# Patient Record
Sex: Male | Born: 1960 | Race: Black or African American | Hispanic: No | Marital: Married | Smoking: Never smoker
Health system: Southern US, Community
[De-identification: ages and names within clinical notes are randomized; demographics above are authoritative.]

## PROBLEM LIST (undated history)

## (undated) DIAGNOSIS — B181 Chronic viral hepatitis B without delta-agent: Secondary | ICD-10-CM

## (undated) DIAGNOSIS — E119 Type 2 diabetes mellitus without complications: Secondary | ICD-10-CM

## (undated) DIAGNOSIS — E7800 Pure hypercholesterolemia, unspecified: Secondary | ICD-10-CM

## (undated) DIAGNOSIS — E785 Hyperlipidemia, unspecified: Secondary | ICD-10-CM

## (undated) DIAGNOSIS — I10 Essential (primary) hypertension: Secondary | ICD-10-CM

## (undated) DIAGNOSIS — R7303 Prediabetes: Secondary | ICD-10-CM

## (undated) DIAGNOSIS — C801 Malignant (primary) neoplasm, unspecified: Secondary | ICD-10-CM

## (undated) HISTORY — DX: Pure hypercholesterolemia, unspecified: E78.00

## (undated) HISTORY — DX: Chronic viral hepatitis B without delta-agent: B18.1

## (undated) HISTORY — DX: Essential (primary) hypertension: I10

## (undated) HISTORY — DX: Hyperlipidemia, unspecified: E78.5

## (undated) HISTORY — DX: Prediabetes: R73.03

---

## 1998-10-09 ENCOUNTER — Ambulatory Visit (HOSPITAL_COMMUNITY): Admission: RE | Admit: 1998-10-09 | Discharge: 1998-10-09 | Payer: Self-pay

## 2004-02-15 DIAGNOSIS — B181 Chronic viral hepatitis B without delta-agent: Secondary | ICD-10-CM | POA: Insufficient documentation

## 2004-02-15 HISTORY — DX: Chronic viral hepatitis B without delta-agent: B18.1

## 2004-02-27 ENCOUNTER — Encounter: Admission: RE | Admit: 2004-02-27 | Discharge: 2004-02-27 | Payer: Self-pay | Admitting: Specialist

## 2004-07-27 ENCOUNTER — Ambulatory Visit (HOSPITAL_COMMUNITY): Admission: RE | Admit: 2004-07-27 | Discharge: 2004-07-27 | Payer: Self-pay | Admitting: Gastroenterology

## 2004-10-29 ENCOUNTER — Ambulatory Visit: Payer: Self-pay | Admitting: Gastroenterology

## 2004-12-09 DIAGNOSIS — E7800 Pure hypercholesterolemia, unspecified: Secondary | ICD-10-CM

## 2004-12-09 HISTORY — DX: Pure hypercholesterolemia, unspecified: E78.00

## 2006-01-05 ENCOUNTER — Emergency Department (HOSPITAL_COMMUNITY): Admission: EM | Admit: 2006-01-05 | Discharge: 2006-01-05 | Payer: Self-pay | Admitting: *Deleted

## 2010-11-20 ENCOUNTER — Ambulatory Visit: Payer: Self-pay | Admitting: Internal Medicine

## 2010-11-20 DIAGNOSIS — I10 Essential (primary) hypertension: Secondary | ICD-10-CM

## 2010-11-20 DIAGNOSIS — E785 Hyperlipidemia, unspecified: Secondary | ICD-10-CM

## 2010-11-28 ENCOUNTER — Encounter (INDEPENDENT_AMBULATORY_CARE_PROVIDER_SITE_OTHER): Payer: Self-pay | Admitting: Internal Medicine

## 2010-11-30 ENCOUNTER — Encounter (INDEPENDENT_AMBULATORY_CARE_PROVIDER_SITE_OTHER): Payer: Self-pay | Admitting: Internal Medicine

## 2010-12-21 ENCOUNTER — Ambulatory Visit
Admission: RE | Admit: 2010-12-21 | Discharge: 2010-12-21 | Payer: Self-pay | Source: Home / Self Care | Attending: Internal Medicine | Admitting: Internal Medicine

## 2010-12-21 ENCOUNTER — Encounter (INDEPENDENT_AMBULATORY_CARE_PROVIDER_SITE_OTHER): Payer: Self-pay | Admitting: Internal Medicine

## 2010-12-21 LAB — CONVERTED CEMR LAB
AST: 21 units/L (ref 0–37)
Blood in Urine, dipstick: NEGATIVE
CO2: 29 meq/L (ref 19–32)
Chloride: 102 meq/L (ref 96–112)
Creatinine, Ser: 0.88 mg/dL (ref 0.40–1.50)
Eosinophils Absolute: 0.6 10*3/uL (ref 0.0–0.7)
Eosinophils Relative: 12 % — ABNORMAL HIGH (ref 0–5)
Glucose, Urine, Semiquant: NEGATIVE
HCT: 44.4 % (ref 39.0–52.0)
HDL: 43 mg/dL (ref >39)
LDL Cholesterol: 103 mg/dL — ABNORMAL HIGH (ref 0–99)
Lymphocytes Relative: 47 % — ABNORMAL HIGH (ref 12–46)
Monocytes Absolute: 0.4 10*3/uL (ref 0.1–1.0)
Neutro Abs: 1.5 10*3/uL — ABNORMAL LOW (ref 1.7–7.7)
Neutrophils Relative %: 32 % — ABNORMAL LOW (ref 43–77)
Potassium: 3.2 meq/L — ABNORMAL LOW (ref 3.5–5.3)
RBC: 5.31 M/uL (ref 4.22–5.81)
Sodium: 142 meq/L (ref 135–145)
Specific Gravity, Urine: >=1.03
Triglycerides: 75 mg/dL (ref <150)
VLDL: 15 mg/dL (ref 0–40)
WBC Urine, dipstick: NEGATIVE
WBC: 4.8 10*3/uL (ref 4.0–10.5)
pH: 6

## 2010-12-25 ENCOUNTER — Encounter (INDEPENDENT_AMBULATORY_CARE_PROVIDER_SITE_OTHER): Payer: Self-pay | Admitting: Internal Medicine

## 2011-01-07 ENCOUNTER — Telehealth (INDEPENDENT_AMBULATORY_CARE_PROVIDER_SITE_OTHER): Payer: Self-pay | Admitting: Internal Medicine

## 2011-01-08 ENCOUNTER — Encounter (INDEPENDENT_AMBULATORY_CARE_PROVIDER_SITE_OTHER): Payer: Self-pay | Admitting: Internal Medicine

## 2011-01-10 NOTE — Assessment & Plan Note (Signed)
Summary: BP recheck, fasting labs  Nurse Visit   Vital Signs:  Patient profile:   50 year old male Weight:      182.9 pounds Temp:     97.1 degrees F oral Pulse rate:   56 / minute Pulse rhythm:   regular Resp:     16 per minute BP sitting:   128 / 94  (left arm) Cuff size:   regular  Vitals Entered By: Dutch Quint RN (December 21, 2010 9:31 AM)  Patient Instructions: 1)  Reviewed with Dr. Delrae Alfred 2)  Your blood pressure is still elevated, but better 3)  Continue to take your medications -- there's no change at this time. 4)  We will let you know the results of your labwork. 5)  Return in one month for a blood pressure recheck.  Make sure you drink plenty of water before your visit, we will recheck your urine at that time. 6)  Call if anything changes or if you have any questions.    Impression & Recommendations:  Problem # 1:  ESSENTIAL HYPERTENSION (ICD-401.9) BP better, still elevated No change in meds Labs done Return in one month for BP recheck with triage nurse and repeat UA (protein in urine)  His updated medication list for this problem includes:    Lisinopril-hydrochlorothiazide 20-25 Mg Tabs (Lisinopril-hydrochlorothiazide) .Marland Kitchen... 1 tab by mouth in morning daily  Orders: T-Comprehensive Metabolic Panel (21308-65784) T-CBC w/Diff (69629-52841) UA Dipstick w/o Micro (automated)  (81003) Est. Patient Level I (32440)  Complete Medication List: 1)  Lisinopril-hydrochlorothiazide 20-25 Mg Tabs (Lisinopril-hydrochlorothiazide) .Marland Kitchen.. 1 tab by mouth in morning daily 2)  Pravastatin Sodium 40 Mg Tabs (Pravastatin sodium) .Marland Kitchen.. 1 tab by mouth daily with evening meal  Other Orders: T-Lipid Profile ([id number])  CC: BP recheck and fasting labs Is Patient Diabetic? No Pain Assessment Patient in pain? no       Does patient need assistance? Functional Status Self care Ambulation Normal   CC:  BP recheck and fasting labs.  History of Present  Illness: 11/20/10- was new pt. BP 146/98.  Had been off BP meds for over a month, started on lisinopril-HCTZ.  Here for BP recheck and fasting labs.  States has been taking medication every day since last visit,  asymptomatic.  Has been NPO since last night.   Physical Exam  General:  alert, well-developed, well-nourished, and well-hydrated.     Review of Systems CV:  Denies CP, cough, headache, visual changes, peripheral edema..   Allergies: No Known Drug Allergies Laboratory Results   Urine Tests  Date/Time Received: December 21, 2010 9:48 AM   Routine Urinalysis   Color: dk. yellow Glucose: negative   (Normal Range: Negative) Bilirubin: negative   (Normal Range: Negative) Ketone: negative   (Normal Range: Negative) Spec. Gravity: >=1.030   (Normal Range: 1.003-1.035) Blood: negative   (Normal Range: Negative) pH: 6.0   (Normal Range: 5.0-8.0) Protein: 30   (Normal Range: Negative) Urobilinogen: 0.2   (Normal Range: 0-1) Nitrite: negative   (Normal Range: Negative) Leukocyte Esterace: negative   (Normal Range: Negative)       Orders Added: 1)  T-Comprehensive Metabolic Panel [[id number]] 2)  T-CBC w/Diff [[id number]] 3)  T-Lipid Profile [[id number]] 4)  UA Dipstick w/o Micro (automated)  [81003] 5)  Est. Patient Level I [63875]

## 2011-01-10 NOTE — Letter (Signed)
Summary: [facility]  [facility]   Imported By: Arta Bruce 12/04/2010 14:23:19  _____________________________________________________________________  External Attachment:    Type:   Image     Comment:   External Document

## 2011-01-10 NOTE — Letter (Signed)
Summary: REQUESTING RECORDS FROM [facility]  REQUESTING RECORDS FROM DR.[facility]   Imported By: Arta Bruce 12/04/2010 13:59:53  _____________________________________________________________________  External Attachment:    Type:   Image     Comment:   External Document

## 2011-01-10 NOTE — Letter (Signed)
Summary: REQUESING RECORDS FROM [facility]  REQUESING RECORDS FROM [facility]   Imported By: Arta Bruce 12/26/2010 11:32:41  _____________________________________________________________________  External Attachment:    Type:   Image     Comment:   External Document

## 2011-01-10 NOTE — Assessment & Plan Note (Signed)
Summary: NEW///[place]   Vital Signs:  Patient profile:   50 year old male Height:      67 inches Weight:      179.19 pounds BMI:     28.17 Temp:     98.2 degrees F oral Pulse rate:   72 / minute Pulse rhythm:   regular Resp:     16 per minute BP sitting:   146 / 98  (left arm) Cuff size:   regular  Vitals Entered By: Hale Drone CMA (November 20, 2010 9:24 AM) CC: Here to establish care. BP concerns. Was treated at [facility] the last 2 years.    CC:  Here to establish care. BP concerns. Was treated at [facility] the last 2 years. .  History of Present Illness: 50 yo male here to establish.  Concerns:  1.  Hypertension:  Diagnosed 8 years ago.  Lisinopril and HCTZ used previously.  Did control blood pressure.  Has not been on Lisinopril for about 3 months and HCTZ for 2 months.  Did not have any problems with a medication  2.  Hypercholesterolemia:  States was just a little high in past.  Has not eaten today.  Was taking Pravastatin 40 mg.  Was taking this up until 2 days ago.  Father and and paternal uncle died suddenly.  Father died at age 54.  Uncle was 78.  Pt. does have a history of weight problem, but has changed lifestyle to lose weight.  No hs of DM.    3.  Hx of Chest Pain:  about 1 year ago--was evaluated at [facility].  Pt. never received information regarding results.  Has not had any further chest pain.  Current Medications (verified): 1)  None  Allergies (verified): No Known Drug Allergies  Past History:  Past Medical History: CHEST PAIN (ICD-786.50) HYPERCHOLESTEROLEMIA (ICD-272.0) ESSENTIAL HYPERTENSION (ICD-401.9)  Past Surgical History: None  Family History: Mother, 28:  Healthy Father, died 60, ? MI--died suddenly 2 Brothers and 3 Sisters on maternal side:  1 Sister with htn 4 Brothers ans 2 Sisters on paternal side:  2 Brothers died from ? stomach ulcers--bled to death.  Sisters healthy 5 children:   ages 22, 51, 60, 68, 47:  all healthy  Social History: Originally from [place] Came to the [employer]. 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 Lives at home with wife and 5 children. Tobacco :  no Alchohol:  no Drugs:  no.  Physical Exam  General:  NAD Lungs:  Normal respiratory effort, chest expands symmetrically. Lungs are clear to auscultation, no crackles or wheezes. Heart:  Normal rate and regular rhythm. S1 and S2 normal without gallop, murmur, click, rub or other extra sounds.  Radial pulse normal and equal   Impression & Recommendations:  Problem # 1:  ESSENTIAL HYPERTENSION (ICD-401.9) Restart meds--sounds like he was actually on Triamterene/HCTZ before after further discussion, but will see how he does with this combo pill His updated medication list for this problem includes:    Lisinopril-hydrochlorothiazide 20-25 Mg Tabs (Lisinopril-hydrochlorothiazide) .Marland Kitchen... 1 tab by mouth in morning daily  Problem # 2:  HYPERCHOLESTEROLEMIA (ICD-272.0)  His updated medication list for this problem includes:    Pravastatin Sodium 40 Mg Tabs (Pravastatin sodium) .Marland Kitchen... 1 tab by mouth daily with evening meal  Complete Medication List: 1)  Lisinopril-hydrochlorothiazide 20-25 Mg Tabs (Lisinopril-hydrochlorothiazide) .Marland Kitchen.. 1 tab by mouth in morning daily 2)  Pravastatin Sodium 40  Mg Tabs (Pravastatin sodium) .Marland Kitchen.. 1 tab by mouth daily with evening meal  Patient Instructions: 1)  Release of info:  Dr. Duaine Dredge, [facility], [facility]--the latter for stress testing. 2)  Nurse visit in one month--fasting for labs as well.  Needs BP check as restarting bp meds and needs CMET, CBC, UA, FLP   3)  Follow up with Dr. Delrae Alfred in 4 months --CPE Prescriptions: PRAVASTATIN SODIUM 40 MG TABS (PRAVASTATIN SODIUM) 1 tab by mouth daily with evening meal  #30 x 11   Entered and Authorized by:   Julieanne Manson MD   Signed by:   Julieanne Manson MD on 11/20/2010   Method used:   Electronically to        [facility] [phone]* (retail)       [address]       [place], [place]  [address]       Ph: [phone]       Fax: [phone]   RxID:   [id number] LISINOPRIL-HYDROCHLOROTHIAZIDE 20-25 MG TABS (LISINOPRIL-HYDROCHLOROTHIAZIDE) 1 tab by mouth in morning daily  #30 x 11   Entered and Authorized by:   Julieanne Manson MD   Signed by:   Julieanne Manson MD on 11/20/2010   Method used:   Electronically to        [facility] [phone]* (retail)       [address]       [place], [place]  [address]       Ph: [phone]       Fax: [phone]   RxID:   [id number]    Orders Added: 1)  New Patient Level II [02409]   Immunization History:  Influenza Immunization History:    Influenza:  fluvax 3+ (09/08/2010)  Tetanus/Td Immunization History:    Tetanus/Td:  historical (06/08/2004)   Immunization History:  Influenza Immunization History:    Influenza:  Fluvax 3+ (09/08/2010)  Tetanus/Td Immunization History:    Tetanus/Td:  Historical (06/08/2004)

## 2011-01-16 NOTE — Progress Notes (Signed)
Summary: BACK PAIN  Phone Note Call from Patient Call back at Home Phone [phone]   Summary of Call: Ceci Taliaferro PT. MR Shadduck SAYS THAT HE HAS BACK PAIN AND WANTS TO BE SEEN TODAY OT TOMORROW AND I EXPLAINED TO HIM THAT THERE IS NOTHING AVAILABLE SOON, AND THAT I'LL TAKE THIS MESSAGE AND HAVE SOMEONE TO CALL HIM. Initial call taken by: Leodis Rains,  January 07, 2011 9:55 AM  Follow-up for Phone Call        Left message on answer machine for pt. to return call.  Gaylyn Cheers RN  January 07, 2011 1:59 PM  Left message on answering machine for pt. to return call.  Dutch Quint RN  January 08, 2011 3:01 PM   Additional Follow-up for Phone Call Additional follow up Details #1::        CALLED BACK PLEASE GIVE HIM A CALL BACK [phone] Additional Follow-up by: Arta Bruce,  January 08, 2011 4:35 PM    Additional Follow-up for Phone Call Additional follow up Details #2::    Need more info on back pain. Please also let him know his labs, including his cholesterol were okay, though if I find out from the cardiology notes that he does have heart disease, would like to see a bit better cholesterol.  He needs to eat an orange or a banana daily as his potassium was a bit low.  Would like to reschedule him for a repeat BMET in a month to recheck the potassium.  Julieanne Manson MD  January 09, 2011 5:45 AM   Back pain started Monday of last week, when he got out of the shower, denies injury.  Pain is in lower back, very sharp at first, then relieved, but got worse over the weekend.  Pain is 8 on pain scale right now.  Went to the chiropractor for adjustments this past Monday, yesterday and has appointment tomorrow, got some relief from pain, but it still having pain after sitting for 5-10 minutes, standing up, still in lower back and left side.  Is taking ibuprofen, has helped some.  Applied peppermint oil, has helped some.  Pain has been pretty constant, but has obtained some  relief from adjustments.  Would like to have something stronger that ibuprofen for pain.   Advised of lab results and provider's recommendations -- verbalized understanding and agreement.  F/U BMET scheduled 02/07/11.   Follow-up by: Dutch Quint RN,  January 09, 2011 11:28 AM  Additional Follow-up for Phone Call Additional follow up Details #3:: Details for Additional Follow-up Action Taken: He will need to be seen to get something stronger.   Encourage gentle stretching after warm packing area for about 15 minutes two times a day. If he develops lower extremity weakness, he will need to be seen sooner.  Julieanne Manson MD  January 10, 2011 1:38 PM   States went to chiropractor -- is somewhat better.  Advised per chiropractor to increase ibuprofen 200 mg. tablets to 3 every 8 hours.  I instructed him to make sure he takes those with food to prevent stomach upset.  Advised of provider's response and recommendations -- verbalized understanding and agreement.  Will call back if pain persists past the weekend.  Has appt. with triage for BP recheck on 01/18/11.  Dutch Quint RN  January 10, 2011 3:55 PM

## 2011-01-18 ENCOUNTER — Encounter: Payer: Self-pay | Admitting: Internal Medicine

## 2011-01-18 ENCOUNTER — Encounter (INDEPENDENT_AMBULATORY_CARE_PROVIDER_SITE_OTHER): Payer: Self-pay | Admitting: Internal Medicine

## 2011-01-18 LAB — CONVERTED CEMR LAB
Bilirubin Urine: NEGATIVE
Blood in Urine, dipstick: NEGATIVE
Nitrite: NEGATIVE
Protein, U semiquant: NEGATIVE
Urobilinogen, UA: 0.2

## 2011-01-24 NOTE — Assessment & Plan Note (Signed)
Summary: BP recheck  Nurse Visit   Vital Signs:  Patient profile:   50 year old male Weight:      180.1 pounds Temp:     98.1 degrees F oral Pulse rate:   60 / minute Pulse rhythm:   regular Resp:     16 per minute BP sitting:   144 / 98  (right arm) Cuff size:   regular  Vitals Entered By: Dutch Quint RN (January 18, 2011 10:13 AM) CC: BP recheck and repeat UA Pain Assessment Patient in pain? yes     Location: lower back, left buttocks Intensity: 6 Type: sharp Onset of pain  two weeks ago, when stepping out of shower  Does patient need assistance? Functional Status Self care Ambulation Normal   CC:  BP recheck and repeat UA.  History of Present Illness: 12/21/10  BP 128/94  P56.  No meds changed at that time.  Had protein in urine, was to drink plenty of water and to have repeat UA today.  Had BP checked at work yesterday, 160/96.  Taking medications every day, no missed doses.  Was taking two meds for BP in the past, is now taking one -- thinks that's why BP is elevated.   Patient Instructions: 1)  Reviewed with Dr. Delrae Alfred 2)  Your blood pressure is still elevated. 3)  Take lisinopril 20 mg. by mouth one tab daily IN ADDITION to the current lisinopril/HCTZ. 4)  Schedule appointment to follow-up with Dr. Delrae Alfred for blood pressure and back pain 5)  If follow up is longer than 1 month-- return for bp check with BMET in 1 month--nurse visit. 6)  Call if anything changes or if you have any questions.   Impression & Recommendations:  Problem # 1:  ESSENTIAL HYPERTENSION (ICD-401.9)  BP is still elevated, taking ibuprofen regularly for back pain Add lisinopril 20 to current meds Return for f/u with provider for back pain, BP If more than a month for f/u, return 1 month for BP recheck and BMET with triage nurse  His updated medication list for this problem includes:    Lisinopril-hydrochlorothiazide 20-25 Mg Tabs (Lisinopril-hydrochlorothiazide) .Marland Kitchen... 1 tab by  mouth in morning daily    Lisinopril 20 Mg Tabs (Lisinopril) .Marland Kitchen... 1 tab by mouth daily with lisinopril/hctz  Orders: UA Dipstick w/o Micro (automated)  (81003)  Complete Medication List: 1)  Lisinopril-hydrochlorothiazide 20-25 Mg Tabs (Lisinopril-hydrochlorothiazide) .Marland Kitchen.. 1 tab by mouth in morning daily 2)  Pravastatin Sodium 40 Mg Tabs (Pravastatin sodium) .Marland Kitchen.. 1 tab by mouth daily with evening meal 3)  Lisinopril 20 Mg Tabs (Lisinopril) .Marland Kitchen.. 1 tab by mouth daily with lisinopril/hctz   Review of Systems CV:  Denies CP, SOB, dizziness, headache, cough, visual changes, peripheral edema.   Physical Exam  General:  alert, well-developed, well-nourished, and well-hydrated.     Allergies: No Known Drug Allergies Laboratory Results   Urine Tests  Date/Time Received: January 18, 2011 11:00 AM   Routine Urinalysis   Glucose: negative   (Normal Range: Negative) Bilirubin: negative   (Normal Range: Negative) Ketone: negative   (Normal Range: Negative) Spec. Gravity: <1.005   (Normal Range: 1.003-1.035) Blood: negative   (Normal Range: Negative) pH: 6.5   (Normal Range: 5.0-8.0) Protein: negative   (Normal Range: Negative) Urobilinogen: 0.2   (Normal Range: 0-1) Nitrite: negative   (Normal Range: Negative) Leukocyte Esterace: negative   (Normal Range: Negative)       Orders Added: 1)  Est. Patient Level II [04540] 2)  UA Dipstick w/o Micro (automated)  [81003] Prescriptions: LISINOPRIL 20 MG TABS (LISINOPRIL) 1 tab by mouth daily with Lisinopril/HCTZ  #30 x 11   Entered by:   Dutch Quint RN   Authorized by:   Julieanne Manson MD   Signed by:   Dutch Quint RN on 01/18/2011   Method used:   Electronically to        [facility] [phone]* (retail)       [address]       [place], [place]  [address]       Ph: [phone]       Fax: [phone]   RxID:   [id number]

## 2011-02-04 ENCOUNTER — Encounter (INDEPENDENT_AMBULATORY_CARE_PROVIDER_SITE_OTHER): Payer: Self-pay | Admitting: Internal Medicine

## 2011-02-06 ENCOUNTER — Telehealth (INDEPENDENT_AMBULATORY_CARE_PROVIDER_SITE_OTHER): Payer: Self-pay | Admitting: Internal Medicine

## 2011-02-06 ENCOUNTER — Encounter (INDEPENDENT_AMBULATORY_CARE_PROVIDER_SITE_OTHER): Payer: Self-pay | Admitting: Internal Medicine

## 2011-02-12 ENCOUNTER — Encounter: Payer: Self-pay | Admitting: Internal Medicine

## 2011-02-12 ENCOUNTER — Encounter (INDEPENDENT_AMBULATORY_CARE_PROVIDER_SITE_OTHER): Payer: Self-pay | Admitting: Internal Medicine

## 2011-02-14 NOTE — Miscellaneous (Signed)
Summary: Dr. Duaine Dredge, [facility] recoreds review  Clinical Lists Changes  Problems: Added new problem of HEPATITIS B, CHRONIC (ICD-070.32) - 02/15/2004:  +Core Ab, +SAg, -SAb, -Be Ag with elevated liver enzymes HBV DNA via PCR:  <2.3 AFP normal [facility]/24/[facility]--Dr. Jacqualine Mau Added new problem of ELEVATED LIVER ENZYMES (ICD-790.5)

## 2011-02-14 NOTE — Letter (Signed)
Summary: RECEIVED RECORDS FROM  [facility]  RECEIVED RECORDS FROM  [facility]   Imported By: Arta Bruce 02/08/2011 10:25:29  _____________________________________________________________________  External Attachment:    Type:   Image     Comment:   External Document

## 2011-02-14 NOTE — Letter (Signed)
Summary: [facility]  [facility]   Imported By: Arta Bruce 02/05/2011 16:24:59  _____________________________________________________________________  External Attachment:    Type:   Image     Comment:   External Document

## 2011-02-14 NOTE — Miscellaneous (Signed)
Summary: [facility] record review  Clinical Lists Changes  Problems: Changed problem from CHEST PAIN (ICD-786.50) - states had stress testing 1 year ago--never heard back.  Sounds like he went to [facility] as well as [facility]. to CHEST PAIN-NONCARDIAC (ICD-786.50) - Normal stress nuclear testing on 11/16/2009 with Dr. Allyson Sabal at [facility] Observations: Added new observation of MIBI STRESS: Dr. Allyson Sabal:  ECG and Nuclear stress testing normal.  EF 63% (11/16/2009 8:49)

## 2011-02-19 NOTE — Letter (Signed)
Summary: REQUESTING RECORDS FROM [facility]  REQUESTING RECORDS FROM [facility]   Imported By: Arta Bruce 02/12/2011 16:52:03  _____________________________________________________________________  External Attachment:    Type:   Image     Comment:   External Document

## 2011-02-19 NOTE — Progress Notes (Signed)
Summary: Need records release for [facility]  Phone Note Outgoing Call   Summary of Call: Please call and ask pt. to sign a records release for [facility]--Dr. Jacqualine Mau.  I have his OV from 07/2004, but want to know if any other follow up done and final assessment regarding Hep B Initial call taken by: Julieanne Manson MD,  February 06, 2011 2:18 PM  Follow-up for Phone Call        Dr. Delrae Alfred -- Pt. has appt. w/you on 02/12/11 @ 12pm. -- Will have him sign then. -- Hale Drone CMA  February 11, 2011 4:32 PM   Additional Follow-up for Phone Call Additional follow up Details #1::        Pt. signed @ visit today. Gaylyn Cheers RN  February 12, 2011 1:09 PM

## 2011-02-19 NOTE — Letter (Signed)
Summary: RECORDS RECEIVED FROM [facility]  RECORDS RECEIVED FROM [facility]   Imported By: Arta Bruce 02/11/2011 09:31:45  _____________________________________________________________________  External Attachment:    Type:   Image     Comment:   External Document

## 2011-02-19 NOTE — Progress Notes (Signed)
Summary: Office Visit//HEALTH SCREEN  Office Visit//HEALTH SCREEN   Imported By: Arta Bruce 02/12/2011 14:11:49  _____________________________________________________________________  External Attachment:    Type:   Image     Comment:   External Document

## 2011-02-19 NOTE — Assessment & Plan Note (Signed)
Summary: OV   Vital Signs:  Patient profile:   50 year old male Weight:      182.3 pounds BSA:     1.95 Temp:     98.3 degrees F oral Pulse rate:   70 / minute Pulse rhythm:   regular Resp:     18 per minute BP sitting:   149 / 95  (left arm) Cuff size:   regular  Vitals Entered By: Gaylyn Cheers RN (February 12, 2011 12:27 PM) CC: F/U HTN Is Patient Diabetic? No Pain Assessment Patient in pain? yes     Location: lower back Intensity: 2 Type: numbness Onset of pain  Constant  Does patient need assistance? Functional Status Self care Ambulation Normal Comments Takes Naproxyn 2 tabs daily for his back pain   CC:  F/U HTN.  History of Present Illness: 1.  Hypertension:  Has been on Lisinopril 20 mg and Lisinopril 20 mg/HCTZ 25 mg for about 1 month and bp is about the same.  Has never taken Amolidipine.   2.  Hypercholesterolemia:  ate today.  Is taking Pravastatin regularly.  3.  Hx of chest pain:  Stress testing from [facility] was negative for ischemia  4.  Hx of Hepatitis B:  [facility]--checking on those records.  Pt. states he did receive the Hepatitis A immunization.  Not clear where  " A doctor from [place] [facility]"  Has record in house--can bring next visit.  Current Medications (verified): 1)  Lisinopril-Hydrochlorothiazide 20-25 Mg Tabs (Lisinopril-Hydrochlorothiazide) .Marland Kitchen.. 1 Tab By Mouth in Morning Daily 2)  Pravastatin Sodium 40 Mg Tabs (Pravastatin Sodium) .Marland Kitchen.. 1 Tab By Mouth Daily With Evening Meal 3)  Lisinopril 20 Mg Tabs (Lisinopril) .Marland Kitchen.. 1 Tab By Mouth Daily With Lisinopril/hctz  Allergies (verified): No Known Drug Allergies  Physical Exam  General:  NAD Lungs:  Normal respiratory effort, chest expands symmetrically. Lungs are clear to auscultation, no crackles or wheezes. Heart:  Normal rate and regular rhythm. S1 and S2 normal without gallop, murmur, click, rub or other extra sounds.  Radial pulses normal and  equal Extremities:  No edema   Impression & Recommendations:  Problem # 1:  HEPATITIS B, CHRONIC (ICD-070.32) To get records from Dr. Jacqualine Mau and pt. to bring documentation of Hep A immunization at next visit.  Problem # 2:  HYPERCHOLESTEROLEMIA (ICD-272.0) Okay in January His updated medication list for this problem includes:    Pravastatin Sodium 40 Mg Tabs (Pravastatin sodium) .Marland Kitchen... 1 tab by mouth daily with evening meal  Problem # 3:  ESSENTIAL HYPERTENSION (ICD-401.9) Start Amlodipine and stop ACE I meds/combo The following medications were removed from the medication list:    Lisinopril-hydrochlorothiazide 20-25 Mg Tabs (Lisinopril-hydrochlorothiazide) .Marland Kitchen... 1 tab by mouth in morning daily    Lisinopril 20 Mg Tabs (Lisinopril) .Marland Kitchen... 1 tab by mouth daily with lisinopril/hctz His updated medication list for this problem includes:    Amlodipine Besylate 10 Mg Tabs (Amlodipine besylate) .Marland Kitchen... 1 tab by mouth daily  Complete Medication List: 1)  Pravastatin Sodium 40 Mg Tabs (Pravastatin sodium) .Marland Kitchen.. 1 tab by mouth daily with evening meal 2)  Amlodipine Besylate 10 Mg Tabs (Amlodipine besylate) .Marland Kitchen.. 1 tab by mouth daily  Patient Instructions: 1)  Keep appt. on 03/21/11 2)  Stop your current blood pressure meds when you start the Amlodipine. 3)  Keep appt. in April Prescriptions: AMLODIPINE BESYLATE 10 MG TABS (AMLODIPINE BESYLATE) 1 tab by mouth daily  #30 x 4  Entered and Authorized by:   Julieanne Manson MD   Signed by:   Julieanne Manson MD on 02/12/2011   Method used:   Faxed to ...       [facility] (retail)       [address] [place], [place]  [address]       Ph: [phone]       Fax: [phone]   RxID:   [id number]    Orders Added: 1)  Est. Patient Level III [phone]

## 2011-09-05 ENCOUNTER — Ambulatory Visit: Payer: Self-pay | Admitting: Gastroenterology

## 2012-05-05 ENCOUNTER — Other Ambulatory Visit (HOSPITAL_COMMUNITY): Payer: Self-pay | Admitting: Family Medicine

## 2012-05-05 ENCOUNTER — Ambulatory Visit (HOSPITAL_COMMUNITY)
Admission: RE | Admit: 2012-05-05 | Discharge: 2012-05-05 | Disposition: A | Discharge status: Home / Self Care | Payer: BC Managed Care – PPO | Source: Ambulatory Visit | Attending: Family Medicine | Admitting: Family Medicine

## 2012-05-05 DIAGNOSIS — M5430 Sciatica, unspecified side: Secondary | ICD-10-CM

## 2012-05-05 DIAGNOSIS — M549 Dorsalgia, unspecified: Secondary | ICD-10-CM | POA: Insufficient documentation

## 2012-05-18 ENCOUNTER — Ambulatory Visit: Payer: BC Managed Care – PPO | Attending: Family Medicine | Admitting: Physical Therapy

## 2012-05-18 DIAGNOSIS — M545 Low back pain, unspecified: Secondary | ICD-10-CM | POA: Insufficient documentation

## 2012-05-18 DIAGNOSIS — M25569 Pain in unspecified knee: Secondary | ICD-10-CM | POA: Insufficient documentation

## 2012-05-18 DIAGNOSIS — IMO0001 Reserved for inherently not codable concepts without codable children: Secondary | ICD-10-CM | POA: Insufficient documentation

## 2012-05-22 ENCOUNTER — Ambulatory Visit: Payer: BC Managed Care – PPO | Admitting: Physical Therapy

## 2012-05-25 ENCOUNTER — Ambulatory Visit: Payer: BC Managed Care – PPO | Admitting: Physical Therapy

## 2012-05-28 ENCOUNTER — Ambulatory Visit: Payer: BC Managed Care – PPO | Admitting: Physical Therapy

## 2012-06-15 ENCOUNTER — Encounter: Payer: Self-pay | Admitting: Gastroenterology

## 2012-07-06 ENCOUNTER — Encounter: Payer: Self-pay | Admitting: *Deleted

## 2012-07-20 ENCOUNTER — Encounter: Payer: BC Managed Care – PPO | Admitting: Gastroenterology

## 2013-01-11 ENCOUNTER — Ambulatory Visit (INDEPENDENT_AMBULATORY_CARE_PROVIDER_SITE_OTHER): Payer: BC Managed Care – PPO | Admitting: Emergency Medicine

## 2013-01-11 VITALS — BP 124/80 | HR 60 | Temp 98.4°F | Resp 16 | Ht 67.0 in | Wt 191.0 lb

## 2013-01-11 DIAGNOSIS — I10 Essential (primary) hypertension: Secondary | ICD-10-CM

## 2013-01-11 DIAGNOSIS — E7800 Pure hypercholesterolemia, unspecified: Secondary | ICD-10-CM

## 2013-01-11 LAB — COMPREHENSIVE METABOLIC PANEL
ALT: 32 U/L (ref 0–53)
AST: 25 U/L (ref 0–37)
Albumin: 4.5 g/dL (ref 3.5–5.2)
Alkaline Phosphatase: 41 U/L (ref 39–117)
CO2: 32 mEq/L (ref 19–32)
Chloride: 104 mEq/L (ref 96–112)
Creat: 0.89 mg/dL (ref 0.50–1.35)
Glucose, Bld: 110 mg/dL — ABNORMAL HIGH (ref 70–99)
Total Bilirubin: 0.9 mg/dL (ref 0.3–1.2)
Total Protein: 7.2 g/dL (ref 6.0–8.3)

## 2013-01-11 MED ORDER — TRIAMTERENE-HCTZ 37.5-25 MG PO CAPS: 1.0000 | ORAL_CAPSULE | ORAL | Status: DC

## 2013-01-11 MED ORDER — DOXYCYCLINE HYCLATE 100 MG PO CAPS: ORAL_CAPSULE | ORAL | Status: DC

## 2013-01-11 MED ORDER — LISINOPRIL 40 MG PO TABS: 40.0000 mg | ORAL_TABLET | Freq: Every day | ORAL | Status: DC

## 2013-01-11 MED ORDER — PRAVASTATIN SODIUM 40 MG PO TABS: 40.0000 mg | ORAL_TABLET | Freq: Every day | ORAL | Status: DC

## 2013-01-11 NOTE — Progress Notes (Signed)
  Subjective:    Patient ID: Justin [name], male    DOB: Apr 23, 1961, 52 y.o.   MRN: [id number]  HPI  Patient comes in this morning to have his blood work completed, to check his cholesterol level. He would like a refill on his medication.   Review of Systems patient also requesting doxycycline to take on his trip to [place]  He has coming up in a few months    Objective:   Physical Exam chest is clear. Heart regular rate no murmurs. Abdomen is soft nontender .        Assessment & Plan:  Patient stable on current medications. Medicines were refilled. Labs chest for cholesterol and Cmet  because of his antihypertensive drugs .

## 2013-01-14 ENCOUNTER — Other Ambulatory Visit: Payer: Self-pay | Admitting: *Deleted

## 2013-01-14 DIAGNOSIS — I10 Essential (primary) hypertension: Secondary | ICD-10-CM

## 2013-01-14 MED ORDER — TRIAMTERENE-HCTZ 37.5-25 MG PO TABS: 1.0000 | ORAL_TABLET | ORAL | Status: DC

## 2013-11-29 ENCOUNTER — Ambulatory Visit (INDEPENDENT_AMBULATORY_CARE_PROVIDER_SITE_OTHER): Payer: BC Managed Care – PPO | Admitting: Family Medicine

## 2013-11-29 VITALS — BP 172/88 | HR 57 | Temp 98.6°F | Resp 18 | Ht 66.0 in | Wt 184.0 lb

## 2013-11-29 DIAGNOSIS — B181 Chronic viral hepatitis B without delta-agent: Secondary | ICD-10-CM

## 2013-11-29 DIAGNOSIS — Z23 Encounter for immunization: Secondary | ICD-10-CM

## 2013-11-29 DIAGNOSIS — E7800 Pure hypercholesterolemia, unspecified: Secondary | ICD-10-CM

## 2013-11-29 DIAGNOSIS — Z0000 Encounter for general adult medical examination without abnormal findings: Secondary | ICD-10-CM

## 2013-11-29 DIAGNOSIS — R7301 Impaired fasting glucose: Secondary | ICD-10-CM

## 2013-11-29 DIAGNOSIS — I10 Essential (primary) hypertension: Secondary | ICD-10-CM

## 2013-11-29 MED ORDER — LISINOPRIL 40 MG PO TABS: 40.0000 mg | ORAL_TABLET | Freq: Every day | ORAL | Status: DC

## 2013-11-29 MED ORDER — TRIAMTERENE-HCTZ 37.5-25 MG PO TABS: 1.0000 | ORAL_TABLET | ORAL | Status: DC

## 2013-11-29 NOTE — Progress Notes (Addendum)
Subjective:  This chart was scribed for Justin Sorenson MD by Arlan Organ, ED Scribe. This patient was seen in room Room 11 and the patient's care was started 6:50 PM.    Patient ID: Justin [name], male    DOB: [birth year], 52 y.o.   MRN: [id number]  Chief Complaint  Patient presents with  . Annual Exam    HPI HPI Comments: Justin [name] is a 52 y.o. male with a h/o HTN and Hyperlipidemia who presents to [facility] seeking a physical examination today. Pt states he sometimes checks his BP at [employer], and says it is typically around 140 systolic. He reports a systolic of 120s occ in the past. He states he took his BP medication around 4 PM today after eating a meal. He states he has not been taking the maxzide as he is intolerant of the gel capsul and pharmacist wouldn't switch it to the tablet w/o a rx written for this. He reports being off of the pravastatin since 05/2103.  He says his last tetanus was in 2004-2005. He states he received a Flu vaccination this year. Pt expressed concerns regarding scheduling for a colonoscopy. "Pt says he is not necessarily concerned about possible cancer", and is not interested in getting a colonoscopy at this time.  Past Medical History  Diagnosis Date  . Hypertension   . Hyperlipidemia     History   Social History  . Marital Status: Married    Spouse Name: N/A    Number of Children: N/A  . Years of Education: N/A   Occupational History  . Not on file.   Social History Main Topics  . Smoking status: Never Smoker   . Smokeless tobacco: Never Used  . Alcohol Use: No  . Drug Use: No  . Sexual Activity: Not on file   Other Topics Concern  . Not on file   Social History Narrative  . No narrative on file    History reviewed. No pertinent past surgical history.   Current Outpatient Prescriptions on File Prior to Visit  Medication Sig Dispense Refill  . lisinopril (PRINIVIL,ZESTRIL) 40 MG tablet Take 1 tablet (40 mg total) by mouth daily.  30 tablet   11  . doxycycline (VIBRAMYCIN) 100 MG capsule Take 1 capsule at the start of your trip and stayed on medication one a day during your trip and for one month after return to  90 capsule  0  . pravastatin (PRAVACHOL) 40 MG tablet Take 1 tablet (40 mg total) by mouth daily.  30 tablet  11  . triamterene-hydrochlorothiazide (MAXZIDE-25) 37.5-25 MG per tablet Take 1 each (1 tablet total) by mouth every morning.  30 tablet  11   No current facility-administered medications on file prior to visit.    No Known Allergies   Review of Systems  All other systems reviewed and are negative.      BP 172/88  Pulse 57  Temp(Src) 98.6 F (37 C) (Oral)  Resp 18  Ht 5\' 6"  (1.676 m)  Wt 184 lb (83.462 kg)  BMI 29.71 kg/m2  SpO2 100% Objective:   Physical Exam  Nursing note and vitals reviewed. Constitutional: He is oriented to person, place, and time. He appears well-developed and well-nourished.  HENT:  Head: Normocephalic and atraumatic.  Nose: Mucosal edema present.  Mucosal edema right worse then left  Eyes: Conjunctivae and EOM are normal. Pupils are equal, round, and reactive to light.  Neck: Normal range of motion. Neck supple. No thyromegaly present.  Cardiovascular: Normal rate, regular rhythm and normal heart sounds.  Exam reveals no gallop and no friction rub.   No murmur heard. Pulses:      Dorsalis pedis pulses are 2+ on the right side, and 2+ on the left side.  Pulmonary/Chest: Effort normal and breath sounds normal. No respiratory distress. He has no wheezes. He has no rales. He exhibits no tenderness.  Abdominal: Soft. Bowel sounds are normal. He exhibits no distension and no mass. There is no tenderness. There is no rebound and no guarding.  Musculoskeletal: Normal range of motion. He exhibits no edema.  Lymphadenopathy:       Head (right side): No submandibular adenopathy present.       Head (left side): No submandibular adenopathy present.    He has no cervical adenopathy.         Right: No supraclavicular adenopathy present.       Left: No supraclavicular adenopathy present.  Neurological: He is alert and oriented to person, place, and time.  Reflex Scores:      Patellar reflexes are 2+ on the right side and 2+ on the left side. Psychiatric: He has a normal mood and affect.   EKG: NSR, no acute ischemic changes. Assessment & Plan:   ESSENTIAL HYPERTENSION - Plan: EKG 12-Lead, PSA, Lipid panel, Comprehensive metabolic panel, TSH, CBC, Hemoglobin A1c - poss white coat HTN - cont to monitor outside of the office and restart maxzide in tablet form. Cont lisinopril.  HEPATITIS B, CHRONIC - Plan: EKG 12-Lead, PSA, Lipid panel, Comprehensive metabolic panel, TSH, CBC, Hemoglobin A1c  HYPERCHOLESTEROLEMIA - Plan: EKG 12-Lead, PSA, Lipid panel, Comprehensive metabolic panel, TSH, CBC, Hemoglobin A1c  Routine general medical examination at a health care facility - Plan: EKG 12-Lead, PSA, Lipid panel, Comprehensive metabolic panel, TSH, CBC, Hemoglobin A1c. TDaP given today. Pt refuses colonoscopy at this time.  Elevated fasting glucose - Plan: EKG 12-Lead, PSA, Lipid panel, Comprehensive metabolic panel, TSH, CBC, Hemoglobin A1c  Meds ordered this encounter  Medications  . lisinopril (PRINIVIL,ZESTRIL) 40 MG tablet    Sig: Take 1 tablet (40 mg total) by mouth daily.    Dispense:  90 tablet    Refill:  3  . triamterene-hydrochlorothiazide (MAXZIDE-25) 37.5-25 MG per tablet    Sig: Take 1 tablet by mouth every morning. Please dispense tablet only, not soft gel.    Dispense:  90 tablet    Refill:  1    Please dispense tablet only, not soft gel.    I personally performed the services described in this documentation, which was scribed in my presence. The recorded information has been reviewed and considered, and addended by me as needed.  Justin Sorenson, MD MPH

## 2013-11-29 NOTE — Patient Instructions (Signed)

## 2013-11-30 LAB — COMPREHENSIVE METABOLIC PANEL
AST: 28 U/L (ref 0–37)
Albumin: 4.5 g/dL (ref 3.5–5.2)
Alkaline Phosphatase: 51 U/L (ref 39–117)
BUN: 9 mg/dL (ref 6–23)
CO2: 30 mEq/L (ref 19–32)
Chloride: 102 mEq/L (ref 96–112)
Glucose, Bld: 94 mg/dL (ref 70–99)
Total Bilirubin: 1.3 mg/dL — ABNORMAL HIGH (ref 0.3–1.2)
Total Protein: 7.5 g/dL (ref 6.0–8.3)

## 2013-11-30 LAB — LIPID PANEL: LDL Cholesterol: 156 mg/dL — ABNORMAL HIGH (ref 0–99)

## 2013-11-30 LAB — CBC
MCH: 30.1 pg (ref 26.0–34.0)
MCV: 83.5 fL (ref 78.0–100.0)
Platelets: 238 10*3/uL (ref 150–400)
RBC: 5.59 MIL/uL (ref 4.22–5.81)

## 2013-12-12 ENCOUNTER — Encounter: Payer: Self-pay | Admitting: Family Medicine

## 2014-01-25 ENCOUNTER — Other Ambulatory Visit: Payer: Self-pay | Admitting: Emergency Medicine

## 2014-06-21 ENCOUNTER — Other Ambulatory Visit: Payer: Self-pay | Admitting: Family Medicine

## 2014-06-22 NOTE — Telephone Encounter (Signed)
Left message for patient to call and schedule.

## 2014-07-21 ENCOUNTER — Telehealth: Payer: Self-pay

## 2014-07-21 ENCOUNTER — Ambulatory Visit: Payer: 59

## 2014-07-21 NOTE — Telephone Encounter (Signed)
Pt was roomed to be seen 07/21/14, Dr. Ouida Sills entered the room and pt decided he could wait no longer. He stated he had waited 2 hours and had to leave. I explained the wait time is dependent on the number of patients as we are a walk in center. Dr. Ouida Sills tried to get patient to stay to no avail. Patient requested refill but Dr. Ouida Sills and I explained he would need to be seen in order to receive his refill. Pt left.

## 2014-07-29 ENCOUNTER — Other Ambulatory Visit: Payer: Self-pay | Admitting: Physician Assistant

## 2014-08-26 ENCOUNTER — Ambulatory Visit (INDEPENDENT_AMBULATORY_CARE_PROVIDER_SITE_OTHER): Payer: 59 | Admitting: Family Medicine

## 2014-08-26 VITALS — BP 122/74 | HR 72 | Temp 98.4°F | Resp 16 | Ht 66.75 in | Wt 181.8 lb

## 2014-08-26 DIAGNOSIS — Z13220 Encounter for screening for lipoid disorders: Secondary | ICD-10-CM

## 2014-08-26 DIAGNOSIS — Z131 Encounter for screening for diabetes mellitus: Secondary | ICD-10-CM

## 2014-08-26 DIAGNOSIS — I10 Essential (primary) hypertension: Secondary | ICD-10-CM

## 2014-08-26 DIAGNOSIS — Z0000 Encounter for general adult medical examination without abnormal findings: Secondary | ICD-10-CM

## 2014-08-26 LAB — COMPREHENSIVE METABOLIC PANEL
ALK PHOS: 51 U/L (ref 39–117)
ALT: 23 U/L (ref 0–53)
AST: 23 U/L (ref 0–37)
Albumin: 4.8 g/dL (ref 3.5–5.2)
BILIRUBIN TOTAL: 1.1 mg/dL (ref 0.2–1.2)
BUN: 8 mg/dL (ref 6–23)
CALCIUM: 9.9 mg/dL (ref 8.4–10.5)
CO2: 32 meq/L (ref 19–32)
Chloride: 103 mEq/L (ref 96–112)
Creat: 0.94 mg/dL (ref 0.50–1.35)
GLUCOSE: 104 mg/dL — AB (ref 70–99)
Potassium: 3.9 mEq/L (ref 3.5–5.3)
Sodium: 141 mEq/L (ref 135–145)
TOTAL PROTEIN: 7.6 g/dL (ref 6.0–8.3)

## 2014-08-26 LAB — LIPID PANEL
CHOLESTEROL: 187 mg/dL (ref 0–200)
HDL: 43 mg/dL (ref >39)
LDL CALC: 125 mg/dL — AB (ref 0–99)
Total CHOL/HDL Ratio: 4.3 Ratio
Triglycerides: 94 mg/dL (ref <150)
VLDL: 19 mg/dL (ref 0–40)

## 2014-08-26 LAB — POCT GLYCOSYLATED HEMOGLOBIN (HGB A1C): Hemoglobin A1C: 5.5

## 2014-08-26 MED ORDER — LOVASTATIN 20 MG PO TABS: 20.0000 mg | ORAL_TABLET | Freq: Every day | ORAL | Status: DC

## 2014-08-26 MED ORDER — LISINOPRIL 40 MG PO TABS: 40.0000 mg | ORAL_TABLET | Freq: Every day | ORAL | Status: DC

## 2014-08-26 NOTE — Progress Notes (Signed)
Physical exam:  History: 53 year old man here for a physical exam. He has no major acute complaints. He has been healthy. He has been out of it as triamterene/hydrochlorothiazide and pravastatin for about a month. He needs forms completed for his company insurance. He  Past surgical history: Operations: None Major medical illnesses: None Regular medications: Lisinopril, triamterene/hydrochlorothiazide, pravastatin Allergies: None  Family history Father is deceased, probably had high blood pressure, gout. Mother is living and well in [place].  Social history: Does not smoke drink or use drugs. He is married. 5 children from 18 down. 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  Review of systems: Constitutional: Unremarkable HEENT: Unremarkable Eyes: Unremarkable Respiratory: Unremarkable Cardiovascular: Gastrointestinal: Unremarkable Endocrinologic: Genitourinary: Unremarkable Musculoskeletal: Unremarkable Dermatologic: Unremarkable Neurologic: Unremarkable Neurologic: Unremarkable Hematologic: Unremarkable Psychiatric: Unremarkable   Physical exam: Pleasant gentleman in no acute distress, fully alert and oriented, healthy-appearing. His TMs are normal. Eyes PERRLA. Fundi benign. Throat clear. Teeth satisfactory. Neck supple without nodes or thyromegaly. No carotid bruits. Chest is clear to auscultation. Heart regular without murmurs. Abdomen soft without mass or tenderness. Normal male external genitalia with testes descended. Patient did not want a prostate exam done, and indeed he has had a normal check of his PSA within the last year the am not sure what he has had a rectal exam. Extremities unremarkable. Skin unremarkable.   assessment: Normal physical examination History of hyperlipidemia History of hypertension, good despite one of his medications  Plan: Rewrote prescriptions Informed him that the A1c is very good and he does not need  to worry about diabetes at this point in time but he should keep his weight down and get regular exercise. Flu shot Labs Will fill out the form for him. Return as needed or in about 6 months.     Results for orders placed in visit on 08/26/14  POCT GLYCOSYLATED HEMOGLOBIN (HGB A1C)      Result Value Ref Range   Hemoglobin A1C 5.5

## 2014-08-26 NOTE — Patient Instructions (Signed)
Lovastatin 20 mg one daily after supper  Take the lisinopril 1 daily  Continue to get regular exercise and do not gain weight  If you're doing well return in 6 months, sooner if needed

## 2014-08-27 ENCOUNTER — Encounter: Payer: Self-pay | Admitting: Family Medicine

## 2015-08-30 ENCOUNTER — Ambulatory Visit (INDEPENDENT_AMBULATORY_CARE_PROVIDER_SITE_OTHER): Payer: 59 | Admitting: Family Medicine

## 2015-08-30 VITALS — BP 162/98 | HR 72 | Temp 98.7°F | Resp 18 | Ht 67.0 in | Wt 185.0 lb

## 2015-08-30 DIAGNOSIS — E785 Hyperlipidemia, unspecified: Secondary | ICD-10-CM | POA: Diagnosis not present

## 2015-08-30 DIAGNOSIS — Z13220 Encounter for screening for lipoid disorders: Secondary | ICD-10-CM

## 2015-08-30 DIAGNOSIS — Z23 Encounter for immunization: Secondary | ICD-10-CM

## 2015-08-30 DIAGNOSIS — I10 Essential (primary) hypertension: Secondary | ICD-10-CM

## 2015-08-30 DIAGNOSIS — Z0000 Encounter for general adult medical examination without abnormal findings: Secondary | ICD-10-CM | POA: Diagnosis not present

## 2015-08-30 LAB — LIPID PANEL
CHOLESTEROL: 180 mg/dL (ref 125–200)
HDL: 36 mg/dL — ABNORMAL LOW (ref >=40)
LDL CALC: 128 mg/dL (ref <130)
Total CHOL/HDL Ratio: 5 Ratio (ref <=5.0)
Triglycerides: 81 mg/dL (ref <150)
VLDL: 16 mg/dL (ref <30)

## 2015-08-30 LAB — COMPREHENSIVE METABOLIC PANEL
ALBUMIN: 4.5 g/dL (ref 3.6–5.1)
ALT: 32 U/L (ref 9–46)
AST: 21 U/L (ref 10–35)
Alkaline Phosphatase: 59 U/L (ref 40–115)
BILIRUBIN TOTAL: 0.8 mg/dL (ref 0.2–1.2)
BUN: 10 mg/dL (ref 7–25)
CO2: 29 mmol/L (ref 20–31)
CREATININE: 0.96 mg/dL (ref 0.70–1.33)
Calcium: 9.4 mg/dL (ref 8.6–10.3)
Chloride: 103 mmol/L (ref 98–110)
Glucose, Bld: 98 mg/dL (ref 65–99)
Potassium: 3.7 mmol/L (ref 3.5–5.3)
SODIUM: 141 mmol/L (ref 135–146)
TOTAL PROTEIN: 7.3 g/dL (ref 6.1–8.1)

## 2015-08-30 LAB — POCT CBC
Granulocyte percent: 43.2 %G (ref 37–80)
HCT, POC: 45.9 % (ref 43.5–53.7)
Hemoglobin: 15.1 g/dL (ref 14.1–18.1)
LYMPH, POC: 2.9 (ref 0.6–3.4)
MCH, POC: 28 pg (ref 27–31.2)
MCHC: 32.9 g/dL (ref 31.8–35.4)
MCV: 85.3 fL (ref 80–97)
MID (CBC): 0.5 (ref 0–0.9)
MPV: 8.7 fL (ref 0–99.8)
PLATELET COUNT, POC: 289 10*3/uL (ref 142–424)
POC Granulocyte: 2.6 (ref 2–6.9)
POC LYMPH %: 48.3 % (ref 10–50)
POC MID %: 8.5 %M (ref 0–12)
RBC: 5.38 M/uL (ref 4.69–6.13)
RDW, POC: 12.9 %
WBC: 6.1 10*3/uL (ref 4.6–10.2)

## 2015-08-30 LAB — POCT URINALYSIS DIP (MANUAL ENTRY)
Bilirubin, UA: NEGATIVE
Blood, UA: NEGATIVE
GLUCOSE UA: NEGATIVE
Ketones, POC UA: NEGATIVE
Leukocytes, UA: NEGATIVE
NITRITE UA: NEGATIVE
Protein Ur, POC: 30 — AB
Spec Grav, UA: 1.025
UROBILINOGEN UA: 0.2
pH, UA: 6

## 2015-08-30 LAB — HEMOGLOBIN A1C
HEMOGLOBIN A1C: 6.2 % — AB (ref <5.7)
MEAN PLASMA GLUCOSE: 131 mg/dL — AB (ref <117)

## 2015-08-30 LAB — IFOBT (OCCULT BLOOD): IFOBT: NEGATIVE

## 2015-08-30 MED ORDER — LISINOPRIL-HYDROCHLOROTHIAZIDE 20-12.5 MG PO TABS: 1.0000 | ORAL_TABLET | Freq: Every day | ORAL | Status: DC

## 2015-08-30 MED ORDER — LOVASTATIN 20 MG PO TABS: 20.0000 mg | ORAL_TABLET | Freq: Every day | ORAL | Status: DC

## 2015-08-30 MED ORDER — AMLODIPINE BESYLATE 5 MG PO TABS: 5.0000 mg | ORAL_TABLET | Freq: Every day | ORAL | Status: DC

## 2015-08-30 NOTE — Patient Instructions (Signed)
Continue taking the lovastatin in the morning before sleeping  Begin amlodipine 5 mg in morning before sleeping  Discontinue the lisinopril 40  Begin lisinopril/HCT 20/12.5 one in the evening before work  Monitor your blood pressure. If you are getting readings frequently above 90s still, please contact me so I can make a change  Because of this different regimen I would like you to come back one time in 4-6 months

## 2015-08-30 NOTE — Progress Notes (Signed)
Patient ID: Justin [name], male    DOB: 02-19-1961  Age: 54 y.o. MRN: [id number]  Chief Complaint  Patient presents with  . Annual Exam  . Hypertension    162/98, pt is fasting and hasn't taking any medications starting 08/29/15    Subjective:   Patient was here for his physical examination. Also though he needed to be managed for his blood pressure and cholesterol. The blood pressures been running high at home, usually in the 37C diastolic. It is time to make some blood pressure medication changes. The patient is not overweight so cannot lose a lot of weight to change his blood pressure. He does not get a lot of regular exercise though he gets a lot of physical activity standing on the job all day. He takes his medicines faithfully.  Current allergies, medications, problem list, past/family and social histories reviewed.  Objective:  BP 162/98 mmHg  Pulse 72  Temp(Src) 98.7 F (37.1 C) (Oral)  Resp 18  Ht 5\' 7"  (1.702 m)  Wt 185 lb (83.915 kg)  BMI 28.97 kg/m2  SpO2 98%  No major acute distress. Chest clear. Heart regular without murmur.  Assessment & Plan:   Hypertension Hyperlipidemia   Plan: Orders Placed This Encounter  Procedures  . Lipid panel  . Hemoglobin A1c  . PSA  . Comprehensive metabolic panel  . POCT CBC  . POCT urinalysis dipstick    Meds ordered this encounter  Medications  . lovastatin (MEVACOR) 20 MG tablet    Sig: Take 1 tablet (20 mg total) by mouth at bedtime.    Dispense:  90 tablet    Refill:  3  . lisinopril-hydrochlorothiazide (ZESTORETIC) 20-12.5 MG per tablet    Sig: Take 1 tablet by mouth daily.    Dispense:  90 tablet    Refill:  3  . amLODipine (NORVASC) 5 MG tablet    Sig: Take 1 tablet (5 mg total) by mouth daily.    Dispense:  90 tablet    Refill:  3     We will change his medicines as listed above. Get him off of the previous medication and switch him to the lisinopril HCT and amlodipine. He is to monitor that  combination.    Patient Instructions  Continue taking the lovastatin in the morning before sleeping  Begin amlodipine 5 mg in morning before sleeping  Discontinue the lisinopril 40  Begin lisinopril/HCT 20/12.5 one in the evening before work  Monitor your blood pressure. If you are getting readings frequently above 90s still, please contact me so I can make a change  Because of this different regimen I would like you to come back one time in 4-6 months     Return in about 4 months (around 12/30/2015).   HOPPER,DAVID, MD 08/30/2015

## 2015-08-30 NOTE — Progress Notes (Signed)
Physical exam:  History: 54 year old man here for a physical exam. He has no major acute complaints. He has been healthy.. He needs forms completed for his company insurance. He takes his blood pressure medication but his blood pressure has been running high. He is changed to working a 7 PM to 7 AM schedule, 3-4 days a week, but currently is working more like 5 days a week.  Past surgical history: Operations: None Major medical illnesses: Hypertension, hyperlipidemia Regular medications: Lisinopril, triamterene/hydrochlorothiazide, pravastatin Allergies: None  Family history Father is deceased, probably had high blood pressure, gout. Mother is living and well in [place]. Brother died recently after a hip fracture. He has multiple half siblings since his father had 4 wives, but cholesterol runs high in the family often.  Social history: Does not smoke drink or use drugs. He is married. 5 children from 18 down. 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  Review of systems: Constitutional: Unremarkable HEENT: Unremarkable Eyes: Unremarkable Respiratory: Unremarkable Cardiovascular: Unremarkable Gastrointestinal: Unremarkable Endocrinologic: Unremarkable Genitourinary: Unremarkable Musculoskeletal: Unremarkable Dermatologic: Unremarkable Neurologic: Unremarkable Neurologic: Unremarkable Hematologic: Unremarkable Psychiatric: Unremarkable   Physical exam: Pleasant gentleman in no acute distress, fully alert and oriented, healthy-appearing. His TMs are normal. Eyes PERRLA. Fundi benign. Throat clear. Teeth satisfactory. Neck supple without nodes or thyromegaly. No carotid bruits. Chest is clear to auscultation. Heart regular without murmurs. Abdomen soft without mass or tenderness. Normal male external genitalia with testes descended. Digital rectal exam was done and prostate gland normal without any nodules. Stool  smear was taken. Extremities unremarkable. Skin unremarkable.  Assessment: Normal physical examination Hyperlipidemia Hypertension unsatisfactory control  Plan: Will change his blood pressure medicine to adding HCT, and possibly will need amlodipine.

## 2015-08-31 LAB — PSA: PSA: 0.91 ng/mL (ref <=4.00)

## 2015-09-03 ENCOUNTER — Encounter: Payer: Self-pay | Admitting: Family Medicine

## 2016-08-31 ENCOUNTER — Ambulatory Visit (INDEPENDENT_AMBULATORY_CARE_PROVIDER_SITE_OTHER): Payer: 59 | Admitting: Physician Assistant

## 2016-08-31 ENCOUNTER — Encounter: Payer: Self-pay | Admitting: Family Medicine

## 2016-08-31 VITALS — BP 154/98 | HR 93 | Temp 98.5°F | Resp 16 | Ht 66.5 in | Wt 179.4 lb

## 2016-08-31 DIAGNOSIS — I10 Essential (primary) hypertension: Secondary | ICD-10-CM | POA: Diagnosis not present

## 2016-08-31 DIAGNOSIS — Z1159 Encounter for screening for other viral diseases: Secondary | ICD-10-CM

## 2016-08-31 DIAGNOSIS — Z1211 Encounter for screening for malignant neoplasm of colon: Secondary | ICD-10-CM | POA: Diagnosis not present

## 2016-08-31 DIAGNOSIS — IMO0001 Reserved for inherently not codable concepts without codable children: Secondary | ICD-10-CM

## 2016-08-31 DIAGNOSIS — Z0000 Encounter for general adult medical examination without abnormal findings: Secondary | ICD-10-CM | POA: Diagnosis not present

## 2016-08-31 DIAGNOSIS — R7303 Prediabetes: Secondary | ICD-10-CM | POA: Diagnosis not present

## 2016-08-31 DIAGNOSIS — B181 Chronic viral hepatitis B without delta-agent: Secondary | ICD-10-CM

## 2016-08-31 DIAGNOSIS — E785 Hyperlipidemia, unspecified: Secondary | ICD-10-CM | POA: Diagnosis not present

## 2016-08-31 DIAGNOSIS — Z23 Encounter for immunization: Secondary | ICD-10-CM | POA: Diagnosis not present

## 2016-08-31 DIAGNOSIS — R05 Cough: Secondary | ICD-10-CM

## 2016-08-31 DIAGNOSIS — R059 Cough, unspecified: Secondary | ICD-10-CM

## 2016-08-31 DIAGNOSIS — Z114 Encounter for screening for human immunodeficiency virus [HIV]: Secondary | ICD-10-CM | POA: Diagnosis not present

## 2016-08-31 DIAGNOSIS — Z13220 Encounter for screening for lipoid disorders: Secondary | ICD-10-CM

## 2016-08-31 HISTORY — DX: Prediabetes: R73.03

## 2016-08-31 LAB — CBC
HEMATOCRIT: 39.6 % (ref 38.5–50.0)
Hemoglobin: 13.2 g/dL (ref 13.2–17.1)
MCH: 27.4 pg (ref 27.0–33.0)
MCHC: 33.3 g/dL (ref 32.0–36.0)
MCV: 82.2 fL (ref 80.0–100.0)
MPV: 10.2 fL (ref 7.5–12.5)
Platelets: 449 10*3/uL — ABNORMAL HIGH (ref 140–400)
RBC: 4.82 MIL/uL (ref 4.20–5.80)
RDW: 12.5 % (ref 11.0–15.0)
WBC: 8.1 10*3/uL (ref 3.8–10.8)

## 2016-08-31 LAB — COMPREHENSIVE METABOLIC PANEL
ALK PHOS: 73 U/L (ref 40–115)
ALT: 17 U/L (ref 9–46)
AST: 16 U/L (ref 10–35)
Albumin: 3.7 g/dL (ref 3.6–5.1)
BILIRUBIN TOTAL: 0.6 mg/dL (ref 0.2–1.2)
BUN: 12 mg/dL (ref 7–25)
CALCIUM: 9.1 mg/dL (ref 8.6–10.3)
CO2: 29 mmol/L (ref 20–31)
Chloride: 99 mmol/L (ref 98–110)
Creat: 1 mg/dL (ref 0.70–1.33)
GLUCOSE: 156 mg/dL — AB (ref 65–99)
POTASSIUM: 4 mmol/L (ref 3.5–5.3)
Sodium: 139 mmol/L (ref 135–146)
TOTAL PROTEIN: 7.3 g/dL (ref 6.1–8.1)

## 2016-08-31 LAB — LIPID PANEL
Cholesterol: 130 mg/dL (ref 125–200)
HDL: 29 mg/dL — AB (ref >=40)
LDL Cholesterol: 87 mg/dL (ref <130)
TRIGLYCERIDES: 68 mg/dL (ref <150)
Total CHOL/HDL Ratio: 4.5 Ratio (ref <=5.0)
VLDL: 14 mg/dL (ref <30)

## 2016-08-31 LAB — HEPATITIS C ANTIBODY: HCV Ab: REACTIVE — AB

## 2016-08-31 MED ORDER — AMLODIPINE BESYLATE 5 MG PO TABS: 5.0000 mg | ORAL_TABLET | Freq: Every day | ORAL | 3 refills | Status: DC

## 2016-08-31 MED ORDER — BENZONATATE 200 MG PO CAPS: 200.0000 mg | ORAL_CAPSULE | Freq: Three times a day (TID) | ORAL | 1 refills | Status: DC | PRN

## 2016-08-31 MED ORDER — LOVASTATIN 20 MG PO TABS: 20.0000 mg | ORAL_TABLET | Freq: Every day | ORAL | 3 refills | Status: DC

## 2016-08-31 MED ORDER — LISINOPRIL-HYDROCHLOROTHIAZIDE 20-12.5 MG PO TABS: 1.0000 | ORAL_TABLET | Freq: Every day | ORAL | 3 refills | Status: DC

## 2016-08-31 NOTE — Patient Instructions (Addendum)
  Thank you for coming in today. Please bring the form back on Monday for [place] to finish it with your lab results.  Continue current medicines.  Try tessalon pills for cough as needed.  You should hear from [facility] gastroenterology soon about colonoscopy screening. Return in one year or sooner if needed.   Colonoscopy A colonoscopy is an exam to look at your colon. This exam can help find lumps (tumors), growths (polyps), bleeding, and redness and puffiness (inflammation) in your colon.  BEFORE THE PROCEDURE  Ask your doctor about changing or stopping your regular medicines.  You may need to drink a large amount of a special liquid (oral bowel prep). You start drinking this the day before your procedure. It will cause you to have watery poop (stool). This cleans out your colon.  Do not eat or drink anything else once you have started the bowel prep, unless your doctor tells you it is safe to do so.  Make plans for someone to drive you home after the procedure. PROCEDURE  You will be given medicine to help you relax (sedative).  You will lie on your side with your knees bent.  A tube with a camera on the end is put in the opening of your butt (anus) and into your colon. Pictures are sent to a computer screen. Your doctor will look for anything that is not normal.  Your doctor may take a tissue sample (biopsy) from your colon to be looked at more closely.  The exam is finished when your doctor has viewed all of the colon. AFTER THE PROCEDURE  Do not drive for 24 hours after the exam.  You may have a small amount of blood in your poop. This is normal.  You may pass gas and have belly (abdominal) cramps. This is normal.  Ask when your test results will be ready. Make sure you get your test results.   This information is not intended to replace advice given to you by your health care provider. Make sure you discuss any questions you have with your health care provider.   Document  Released: 12/28/2010 Document Revised: 11/30/2013 Document Reviewed: 08/02/2013 Elsevier Interactive Patient Education 2016 [employer].    IF you received an x-ray today, you will receive an invoice from [facility]. Please contact [facility] at [phone] with questions or concerns regarding your invoice.   IF you received labwork today, you will receive an invoice from [employer]. Please contact [facility] at [phone] with questions or concerns regarding your invoice.   Our billing staff will not be able to assist you with questions regarding bills from these companies.  You will be contacted with the lab results as soon as they are available. The fastest way to get your results is to activate your My Chart account. Instructions are located on the last page of this paperwork. If you have not heard from [place] regarding the results in 2 weeks, please contact this office.

## 2016-08-31 NOTE — Progress Notes (Signed)
Justin [name] is a 55 y.o. male who presents to [facility] today for well adult visit. Patient has a history of hypertension, hyperlipidemia, and chronic hepatitis B. In the interim he has done well with the below medications. He checks his blood pressure at home and most of the time his systolic blood pressures in the [id number] and his diastolic pressures in the 80s. He denies chest pain palpitations or shortness of breath or lightheadedness with his medications. Additionally he denies any significant muscle soreness with the statin medication he takes. He does however note over the last 3 weeks of mild nonproductive cough. He denies significant runny nose sneezing or itchy watery eyes or fevers or chills. He feels well and notes her cough is mild and nagging.  Additionally he notes that he has not had colon cancer screening and is interested in colonoscopy. He would like a flu vaccine today as well.     Past Medical History:  Diagnosis Date  . High cholesterol 2006  . Hyperlipidemia   . Hypertension    No past surgical history on file. Social History  Substance Use Topics  . Smoking status: Never Smoker  . Smokeless tobacco: Never Used  . Alcohol use No   ROS as above Medications: Current Outpatient Prescriptions  Medication Sig Dispense Refill  . amLODipine (NORVASC) 5 MG tablet Take 1 tablet (5 mg total) by mouth daily. 90 tablet 3  . lisinopril-hydrochlorothiazide (ZESTORETIC) 20-12.5 MG per tablet Take 1 tablet by mouth daily. 90 tablet 3  . lovastatin (MEVACOR) 20 MG tablet Take 1 tablet (20 mg total) by mouth at bedtime. 90 tablet 3   No current facility-administered medications for this visit.    No Known Allergies   Exam:  BP (!) 154/98   Pulse 93   Temp 98.5 F (36.9 C) (Oral)   Resp 16   Ht 5' 6.5" (1.689 m)   Wt 179 lb 6.4 oz (81.4 kg)   SpO2 97%   BMI 28.52 kg/m  Gen: Well NAD HEENT: EOMI,  MMM Normal posterior pharynx. No scleral icterus present Lungs:  Normal work of breathing. CTABL Heart: RRR no MRG Abd: NABS, Soft. Nondistended, Nontender. Liver is not palpable on physical exam and percusses to normal side. No ascites or abdominal varices present. Exts: Brisk capillary refill, warm and well perfused.  Depression screen [facility] 2/9 08/31/2016 08/30/2015  Decreased Interest 0 0  Down, Depressed, Hopeless 0 0  PHQ - 2 Score 0 0     No results found for this or any previous visit (from the past 24 hour(s)). No results found.  Assessment and Plan: 55 y.o. male with   1) Well Adult: Will obtain basic fasting labs as well as screening for HIV and HepC per routine health Maintenance.  2) colon cancer screening: Refer to the [facility] gastroenterology.  3) hepatitis B: Check liver enzymes as well as hepatitis B DNA levels to determine need for antiviral medication. If both are elevated would recommend liver ultrasound for evaluation of cirrhosis. However patient displays no outward signs of cirrhosis at this time.  4) hypertension: Not well controlled in the clinic however at home blood pressure seems to be in the normal range. Continue current regimen  5) hyperlipidemia: Check fasting labs today.  6) cough: Likely viral or allergic. Discussed options. Plan for trial of Tessalon Perles. Return if not better.  Influenza vaccine given prior to discharge.  Discussed warning signs or symptoms. Please see discharge instructions. Patient expresses understanding.

## 2016-09-01 LAB — HEMOGLOBIN A1C
HEMOGLOBIN A1C: 7.4 % — AB (ref <5.7)
Mean Plasma Glucose: 166 mg/dL

## 2016-09-01 LAB — HIV ANTIBODY (ROUTINE TESTING W REFLEX): HIV 1&2 Ab, 4th Generation: NONREACTIVE

## 2016-09-02 ENCOUNTER — Encounter: Payer: Self-pay | Admitting: Family Medicine

## 2016-09-02 DIAGNOSIS — B1920 Unspecified viral hepatitis C without hepatic coma: Secondary | ICD-10-CM | POA: Insufficient documentation

## 2016-09-02 DIAGNOSIS — E119 Type 2 diabetes mellitus without complications: Secondary | ICD-10-CM | POA: Insufficient documentation

## 2016-09-03 LAB — HEPATITIS C RNA QUANTITATIVE: HCV QUANT: NOT DETECTED [IU]/mL (ref <15)

## 2016-09-05 LAB — HEPATITIS B DNA, ULTRAQUANTITATIVE, PCR
HEPATITIS B DNA (CALC): 2.89 {Log_IU}/mL — AB (ref <1.30)
Hepatitis B DNA: 785 IU/mL — ABNORMAL HIGH (ref <20)

## 2016-09-30 ENCOUNTER — Ambulatory Visit (HOSPITAL_COMMUNITY)
Admission: EM | Admit: 2016-09-30 | Discharge: 2016-09-30 | Disposition: A | Discharge status: Home / Self Care | Payer: 59 | Attending: Family Medicine | Admitting: Family Medicine

## 2016-09-30 ENCOUNTER — Encounter (HOSPITAL_COMMUNITY): Payer: Self-pay | Admitting: Family Medicine

## 2016-09-30 DIAGNOSIS — S61216A Laceration without foreign body of right little finger without damage to nail, initial encounter: Secondary | ICD-10-CM

## 2016-09-30 MED ORDER — LIDOCAINE HCL 2 % IJ SOLN
INTRAMUSCULAR | Status: AC
  Filled 2016-09-30: qty 20

## 2016-09-30 MED ORDER — HYDROCODONE-ACETAMINOPHEN 5-325 MG PO TABS: 1.0000 | ORAL_TABLET | Freq: Four times a day (QID) | ORAL | 0 refills | Status: DC | PRN

## 2016-09-30 MED ORDER — MUPIROCIN 2 % EX OINT: 1.0000 "application " | TOPICAL_OINTMENT | Freq: Three times a day (TID) | CUTANEOUS | 1 refills | Status: DC

## 2016-09-30 NOTE — ED Provider Notes (Addendum)
[place]    CSN: [id number] Arrival date & time: 09/30/16  1433     History   Chief Complaint Chief Complaint  Patient presents with  . Laceration    HPI Justin [name] is a 55 y.o. male.   This is a 55 year old man works in a [employer]. He was working at home today replacing a motor in his car when the piece of machinery fell on the left pinky finger lacerating it. He has a little bit of numbness on the radial side of his pinky finger but has no serious pain. The bleeding is stopped.  Patient's last tetanus shot was within 5 years.      Past Medical History:  Diagnosis Date  . Chronic hepatitis B virus infection ([place]) 02/15/2004   Annotation: 02/15/2004:  +Core Ab, +SAg, -SAb, -Be Ag with elevated liver  enzymes HBV DNA via PCR:  <2.3 AFP normal Evaluated by [facility] 07/24/2004--Dr. Patsy [place] Qualifier: Diagnosis of  By: Amil Amen MD, Benjamine Mola    . High cholesterol 2006  . Hyperlipidemia   . Hypertension   . Prediabetes 08/31/2016   Lab Results Component Value Date  HGBA1C 6.2 (H) 08/30/2015      Patient Active Problem List   Diagnosis Date Noted  . Hepatitis C 09/02/2016  . Diabetes ([place]) 09/02/2016  . Prediabetes 08/31/2016  . Dyslipidemia 11/20/2010  . Essential hypertension 11/20/2010  . Chronic hepatitis B virus infection ([place]) 02/15/2004    History reviewed. No pertinent surgical history.     Home Medications    Prior to Admission medications   Medication Sig Start Date End Date Taking? Authorizing Provider  amLODipine (NORVASC) 5 MG tablet Take 1 tablet (5 mg total) by mouth daily. 08/31/16   Gregor Hams, MD  benzonatate (TESSALON) 200 MG capsule Take 1 capsule (200 mg total) by mouth 3 (three) times daily as needed for cough. 08/31/16   Gregor Hams, MD  HYDROcodone-acetaminophen (NORCO) 5-325 MG tablet Take 1 tablet by mouth every 6 (six) hours as needed for moderate pain. 09/30/16   Robyn Haber, MD    lisinopril-hydrochlorothiazide (ZESTORETIC) 20-12.5 MG tablet Take 1 tablet by mouth daily. 08/31/16   Gregor Hams, MD  lovastatin (MEVACOR) 20 MG tablet Take 1 tablet (20 mg total) by mouth at bedtime. 08/31/16   Gregor Hams, MD  mupirocin ointment (BACTROBAN) 2 % Apply 1 application topically 3 (three) times daily. 09/30/16   Robyn Haber, MD    Family History Family History  Problem Relation Age of Onset  . Heart disease Father   . Hyperlipidemia Father     Social History Social History  Substance Use Topics  . Smoking status: Never Smoker  . Smokeless tobacco: Never Used  . Alcohol use No     Allergies   Review of patient's allergies indicates no known allergies.   Review of Systems Review of Systems  Constitutional: Negative.   HENT: Negative.   Respiratory: Negative.   Neurological: Positive for numbness.     Physical Exam Triage Vital Signs ED Triage Vitals [09/30/16 1442]  Enc Vitals Group     BP (!) 155/108     Pulse Rate 113     Resp 18     Temp 98.7 F (37.1 C)     Temp Source Oral     SpO2 99 %     Weight      Height      Head Circumference  Peak Flow      Pain Score      Pain Loc      Pain Edu?      Excl. in [place]?    No data found.   Updated Vital Signs BP (!) 155/108 (BP Location: Left Arm)   Pulse 113   Temp 98.7 F (37.1 C) (Oral)   Resp 18   SpO2 99%    Physical Exam  Constitutional: He appears well-developed and well-nourished.  HENT:  Head: Normocephalic.  Pulmonary/Chest: Effort normal.  Musculoskeletal:  Patient has a 2 cm laceration, L-shaped, on the distal phalanx-volar side of the right pinky finger.  Good range of motion and no bony tenderness dorsally.  Neurological:  Patient has slight numbness to touch on the radial side of his distal phalanx of the right pinky finger. He has full range of motion of that finger.  Nursing note and vitals reviewed.    UC Treatments / Results  Labs (all labs ordered are  listed, but only abnormal results are displayed) Labs Reviewed - No data to display  EKG  EKG Interpretation None       Radiology No results found.  Procedures .Marland KitchenLaceration Repair Date/Time: 09/30/2016 3:10 PM Performed by: Robyn Haber Authorized by: Robyn Haber   Consent:    Consent obtained:  Verbal   Consent given by:  Patient   Risks discussed:  Infection, pain and nerve damage   Alternatives discussed:  No treatment and delayed treatment Anesthesia (see MAR for exact dosages):    Anesthesia method:  Nerve block   Block anesthetic:  Lidocaine 1% w/o epi   Block injection procedure:  Anatomic landmarks identified and introduced needle   Block outcome:  Anesthesia achieved Laceration details:    Location:  Finger   Finger location:  R small finger   Length (cm):  2   Depth (mm):  4 Repair type:    Repair type:  Simple Pre-procedure details:    Preparation:  Patient was prepped and draped in usual sterile fashion Exploration:    Wound exploration: wound explored through full range of motion     Contaminated: no   Treatment:    Area cleansed with:  Betadine   Amount of cleaning:  Standard   Visualized foreign bodies/material removed: no   Skin repair:    Repair method:  Sutures   Suture size:  5-0   Suture material:  Prolene   Suture technique:  Horizontal mattress   Number of sutures:  3 Post-procedure details:    Dressing:  Antibiotic ointment, non-adherent dressing and splint for protection   Patient tolerance of procedure:  Tolerated well, no immediate complications     (including critical care time)  Medications Ordered in UC Medications - No data to display   Initial Impression / Assessment and Plan / UC Course  I have reviewed the triage vital signs and the nursing notes.  Pertinent labs & imaging results that were available during my care of the patient were reviewed by me and considered in my medical decision making (see chart for  details).  Clinical Course    Final Clinical Impressions(s) / UC Diagnoses   Final diagnoses:  Laceration of right little finger without damage to nail, foreign body presence unspecified, initial encounter    New Prescriptions New Prescriptions   HYDROCODONE-ACETAMINOPHEN (NORCO) 5-325 MG TABLET    Take 1 tablet by mouth every 6 (six) hours as needed for moderate pain.   MUPIROCIN OINTMENT (BACTROBAN) 2 %  Apply 1 application topically 3 (three) times daily.     Robyn Haber, MD 09/30/16 1533    Robyn Haber, MD 09/30/16 1537

## 2016-09-30 NOTE — Discharge Instructions (Signed)
Return in one week to have the sutures removed.    Please return for increasing redness, pain or swelling if it occurs  You may wash finger in 24 hours.  Use the antibacterial cream twice daily.

## 2016-09-30 NOTE — ED Triage Notes (Signed)
Patient reports a motor fell on patients right little finger. Minimal bleeding present.  This is a jagged cut to palmar side of finger.

## 2016-10-02 ENCOUNTER — Encounter: Payer: Self-pay | Admitting: Physician Assistant

## 2016-10-08 ENCOUNTER — Encounter (HOSPITAL_COMMUNITY): Payer: Self-pay | Admitting: *Deleted

## 2016-10-08 ENCOUNTER — Ambulatory Visit (HOSPITAL_COMMUNITY)
Admission: EM | Admit: 2016-10-08 | Discharge: 2016-10-08 | Disposition: A | Discharge status: Home / Self Care | Payer: 59 | Attending: Family Medicine | Admitting: Family Medicine

## 2016-10-08 DIAGNOSIS — Z4802 Encounter for removal of sutures: Secondary | ICD-10-CM

## 2016-10-08 NOTE — ED Triage Notes (Signed)
Pt  Is here   For  Suture  Removal  Appear  Well  Healing

## 2016-10-08 NOTE — ED Provider Notes (Signed)
[place]    CSN: [id number] Arrival date & time: 10/08/16  1633     History   Chief Complaint Chief Complaint  Patient presents with  . Suture / Staple Removal    HPI Justin [name] is a 55 y.o. male.   The history is provided by the patient.  Suture / Staple Removal  This is a new problem. The current episode started more than 1 week ago. The problem has been resolved. Associated symptoms comments: Well healed, no problems..    Past Medical History:  Diagnosis Date  . Chronic hepatitis B virus infection ([place]) 02/15/2004   Annotation: 02/15/2004:  +Core Ab, +SAg, -SAb, -Be Ag with elevated liver  enzymes HBV DNA via PCR:  <2.3 AFP normal Evaluated by [facility] 07/24/2004--Dr. Patsy Baltimore Qualifier: Diagnosis of  By: Amil Amen MD, Benjamine Mola    . High cholesterol 2006  . Hyperlipidemia   . Hypertension   . Prediabetes 08/31/2016   Lab Results Component Value Date  HGBA1C 6.2 (H) 08/30/2015      Patient Active Problem List   Diagnosis Date Noted  . Hepatitis C 09/02/2016  . Diabetes ([place]) 09/02/2016  . Prediabetes 08/31/2016  . Dyslipidemia 11/20/2010  . Essential hypertension 11/20/2010  . Chronic hepatitis B virus infection ([place]) 02/15/2004    History reviewed. No pertinent surgical history.     Home Medications    Prior to Admission medications   Medication Sig Start Date End Date Taking? Authorizing Provider  amLODipine (NORVASC) 5 MG tablet Take 1 tablet (5 mg total) by mouth daily. 08/31/16   Gregor Hams, MD  benzonatate (TESSALON) 200 MG capsule Take 1 capsule (200 mg total) by mouth 3 (three) times daily as needed for cough. 08/31/16   Gregor Hams, MD  HYDROcodone-acetaminophen (NORCO) 5-325 MG tablet Take 1 tablet by mouth every 6 (six) hours as needed for moderate pain. 09/30/16   Robyn Haber, MD  lisinopril-hydrochlorothiazide (ZESTORETIC) 20-12.5 MG tablet Take 1 tablet by mouth daily. 08/31/16   Gregor Hams, MD  lovastatin  (MEVACOR) 20 MG tablet Take 1 tablet (20 mg total) by mouth at bedtime. 08/31/16   Gregor Hams, MD  mupirocin ointment (BACTROBAN) 2 % Apply 1 application topically 3 (three) times daily. 09/30/16   Robyn Haber, MD    Family History Family History  Problem Relation Age of Onset  . Heart disease Father   . Hyperlipidemia Father     Social History Social History  Substance Use Topics  . Smoking status: Never Smoker  . Smokeless tobacco: Never Used  . Alcohol use No     Allergies   Review of patient's allergies indicates no known allergies.   Review of Systems Review of Systems  Constitutional: Negative.   Musculoskeletal: Negative.   Skin: Positive for wound.  All other systems reviewed and are negative.    Physical Exam Triage Vital Signs ED Triage Vitals [10/08/16 1647]  Enc Vitals Group     BP 141/94     Pulse Rate 106     Resp 14     Temp 99.2 F (37.3 C)     Temp Source Oral     SpO2 98 %     Weight      Height      Head Circumference      Peak Flow      Pain Score      Pain Loc      Pain Edu?  Excl. in [place]?    No data found.   Updated Vital Signs BP 141/94 (BP Location: Left Arm)   Pulse 106   Temp 99.2 F (37.3 C) (Oral)   Resp 14   SpO2 98%   Visual Acuity Right Eye Distance:   Left Eye Distance:   Bilateral Distance:    Right Eye Near:   Left Eye Near:    Bilateral Near:     Physical Exam  Constitutional: He appears well-developed and well-nourished.  Skin: Skin is warm and dry.  Finger lac well healed, 3 stitches removed  Nursing note and vitals reviewed.    UC Treatments / Results  Labs (all labs ordered are listed, but only abnormal results are displayed) Labs Reviewed - No data to display  EKG  EKG Interpretation None       Radiology No results found.  Procedures Procedures (including critical care time)  Medications Ordered in UC Medications - No data to display   Initial Impression / Assessment  and Plan / UC Course  I have reviewed the triage vital signs and the nursing notes.  Pertinent labs & imaging results that were available during my care of the patient were reviewed by me and considered in my medical decision making (see chart for details).  Clinical Course      Final Clinical Impressions(s) / UC Diagnoses   Final diagnoses:  Encounter for removal of sutures    New Prescriptions New Prescriptions   No medications on file     Billy Fischer, MD 10/08/16 1657

## 2016-10-15 ENCOUNTER — Ambulatory Visit (INDEPENDENT_AMBULATORY_CARE_PROVIDER_SITE_OTHER): Payer: 59 | Admitting: Family Medicine

## 2016-10-15 VITALS — BP 122/72 | HR 96 | Temp 99.1°F | Resp 17 | Ht 67.0 in | Wt 173.0 lb

## 2016-10-15 DIAGNOSIS — E119 Type 2 diabetes mellitus without complications: Secondary | ICD-10-CM

## 2016-10-15 DIAGNOSIS — B181 Chronic viral hepatitis B without delta-agent: Secondary | ICD-10-CM

## 2016-10-15 LAB — GLUCOSE, POCT (MANUAL RESULT ENTRY): POC Glucose: 138 mg/dl — AB (ref 70–99)

## 2016-10-15 MED ORDER — ASPIRIN EC 81 MG PO TBEC: 81.0000 mg | DELAYED_RELEASE_TABLET | Freq: Every day | ORAL | Status: DC

## 2016-10-15 MED ORDER — BLOOD GLUCOSE MONITOR KIT: PACK | 0 refills | Status: DC

## 2016-10-15 NOTE — Progress Notes (Signed)
Subjective:  By signing my name below, I, Essence Howell, attest that this documentation has been prepared under the direction and in the presence of Delman Cheadle, MD Electronically Signed: Ladene Artist, ED Scribe 10/15/2016 at 8:12 AM.   Patient ID: Justin [name], male    DOB: Feb 01, 1961, 55 y.o.   MRN: [id number]  Chief Complaint  Patient presents with  . Diabetes   HPI HPI Comments: Justin [name] is a 55 y.o. male, with a h/o HTN, hyperlipidemia and chronic hepatitis B, who presents to the [facility] for a follow-up on elevated blood glucose. He was seen for his full physical 6 weeks prior with Dr. Georgina Snell. Fasting labs returned showing a glucose of 156. Hgb A1C was 7.4. Pt was advised to return to clinic to discuss. He also was found to have hepatitis B and C, fortunately the C was not detectable. Pt checks his BP outside the office which remains well controlled in the 130s/80s; tolerating statin.   Pt states that he used to have active hepatitis in the 1980s, was treated and it became dormant after that. He has a nephew in [place] with a h/o DM. Pt plans to travel back home to [place] for 3 weeks next week. He had an [place] of his liver in 2005 and agrees to have another one done when he returns from his trip. Pt states that he has already started making lifestyle changes which includes reducing his sugar intake. He states that he has switched to Splenda, diet sodas, brown rice and wheat bread. Pt is not currently taking a daily baby aspirin. Pt states that he is not interested in following up with a nutritionist at this moment but will pursue this option in the future.   Past Medical History:  Diagnosis Date  . Chronic hepatitis B virus infection ([place]) 02/15/2004   Annotation: 02/15/2004:  +Core Ab, +SAg, -SAb, -Be Ag with elevated liver  enzymes HBV DNA via PCR:  <2.3 AFP normal Evaluated by [facility] 07/24/2004--Dr. Patsy Baltimore Qualifier: Diagnosis of  By: Amil Amen MD,  Benjamine Mola    . High cholesterol 2006  . Hyperlipidemia   . Hypertension   . Prediabetes 08/31/2016   Lab Results Component Value Date  HGBA1C 6.2 (H) 08/30/2015     Current Outpatient Prescriptions on File Prior to Visit  Medication Sig Dispense Refill  . amLODipine (NORVASC) 5 MG tablet Take 1 tablet (5 mg total) by mouth daily. 90 tablet 3  . lisinopril-hydrochlorothiazide (ZESTORETIC) 20-12.5 MG tablet Take 1 tablet by mouth daily. 90 tablet 3  . lovastatin (MEVACOR) 20 MG tablet Take 1 tablet (20 mg total) by mouth at bedtime. 90 tablet 3  . mupirocin ointment (BACTROBAN) 2 % Apply 1 application topically 3 (three) times daily. 22 g 1  . benzonatate (TESSALON) 200 MG capsule Take 1 capsule (200 mg total) by mouth 3 (three) times daily as needed for cough. (Patient not taking: Reported on 10/15/2016) 45 capsule 1   No current facility-administered medications on file prior to visit.    No Known Allergies  Review of Systems  Constitutional: Positive for activity change and appetite change. Negative for chills, diaphoresis, fever and unexpected weight change.  Gastrointestinal: Negative for abdominal distention, abdominal pain, constipation, nausea and vomiting.  Neurological: Negative for weakness and numbness.   BP 122/72 (BP Location: Right Arm, Patient Position: Sitting, Cuff Size: Normal)   Pulse 96   Temp 99.1 F (37.3 C) (Oral)   Resp 17  Ht '5\' 7"'  (1.702 m)   Wt 173 lb (78.5 kg)   SpO2 97%   BMI 27.10 kg/m     Objective:   Physical Exam  Constitutional: He is oriented to person, place, and time. He appears well-developed and well-nourished. No distress.  HENT:  Head: Normocephalic and atraumatic.  Eyes: Conjunctivae and EOM are normal.  Neck: Neck supple. No tracheal deviation present. No thyromegaly present.  Cardiovascular: Normal rate, regular rhythm, S1 normal, S2 normal and normal heart sounds.   Pulmonary/Chest: Effort normal and breath sounds normal. No  respiratory distress.  Musculoskeletal: Normal range of motion.  Neurological: He is alert and oriented to person, place, and time.  Skin: Skin is warm and dry.  Psychiatric: He has a normal mood and affect. His behavior is normal.  Nursing note and vitals reviewed.  Results for orders placed or performed in visit on 10/15/16  POCT glucose (manual entry)  Result Value Ref Range   POC Glucose 138 (A) 70 - 99 mg/dl      Assessment & Plan:   1. Type 2 diabetes mellitus without complication, without long-term current use of insulin (HCC) - new diagnosis sev wks prior.  Pt has made sig lifestyle changes and so is very anxious to know what type of impact that has had on his blood sugars.   Teaching today on how to check and interpret home cbs.  2. Chronic hepatitis B virus infection ([place]) - has been latent, requests recheck to ensure eradication and [place] to ensure no sig liver changes from prior infxn.    Orders Placed This Encounter  Procedures  . US Abdomen Limited RUQ    EPIC ORDER    Standing Status:   Future    Standing Expiration Date:   12/15/2017    Order Specific Question:   Reason for Exam (SYMPTOM  OR DIAGNOSIS REQUIRED)    Answer:   f/u chronic hepatitis B infection    Order Specific Question:   Preferred imaging location?    Answer:   [facility]  . Fructosamine  . POCT glucose (manual entry)    Meds ordered this encounter  Medications  . aspirin EC 81 MG tablet    Sig: Take 1 tablet (81 mg total) by mouth daily.  . blood glucose meter kit and supplies KIT    Sig: Dispense based on patient and insurance preference. Use up to four times daily as directed. (FOR ICD-9 250.00, 250.01).    Dispense:  1 each    Refill:  0    Order Specific Question:   Number of strips    Answer:   1000    Order Specific Question:   Number of lancets    Answer:   1000    I personally performed the services described in this documentation, which was scribed in my presence. The  recorded information has been reviewed and considered, and addended by me as needed.   Delman Cheadle, M.D.  [facility] [address] [place], [place] [address] [phone] phone [phone] fax  10/22/16 11:40 PM

## 2016-10-15 NOTE — Patient Instructions (Addendum)
IF you received an x-ray today, you will receive an invoice from [facility] Radiology. Please contact [facility] Radiology at [phone] with questions or concerns regarding your invoice.   IF you received labwork today, you will receive an invoice from [employer]. Please contact [facility] at [phone] with questions or concerns regarding your invoice.   Our billing staff will not be able to assist you with questions regarding bills from these companies.  You will be contacted with the lab results as soon as they are available. The fastest way to get your results is to activate your My Chart account. Instructions are located on the last page of this paperwork. If you have not heard from [place] regarding the results in 2 weeks, please contact this office.     Blood Glucose Monitoring, Adult Monitoring your blood glucose (also know as blood sugar) helps you to manage your diabetes. It also helps you and your health care provider monitor your diabetes and determine how well your treatment plan is working. WHY SHOULD YOU MONITOR YOUR BLOOD GLUCOSE?  It can help you understand how food, exercise, and medicine affect your blood glucose.  It allows you to know what your blood glucose is at any given moment. You can quickly tell if you are having low blood glucose (hypoglycemia) or high blood glucose (hyperglycemia).  It can help you and your health care provider know how to adjust your medicines.  It can help you understand how to manage an illness or adjust medicine for exercise. WHEN SHOULD YOU TEST? Your health care provider will help you decide how often you should check your blood glucose. This may depend on the type of diabetes you have, your diabetes control, or the types of medicines you are taking. Be sure to write down all of your blood glucose readings so that this information can be reviewed with your health care provider. See below for examples of  testing times that your health care provider may suggest. Type 1 Diabetes  Test at least 2 times per day if your diabetes is well controlled, if you are using an insulin pump, or if you perform multiple daily injections.  If your diabetes is not well controlled or if you are sick, you may need to test more often.  It is a good idea to also test:  Before every insulin injection.  Before and after exercise.  Between meals and 2 hours after a meal.  Occasionally between 2:00 a.m. and 3:00 a.m. Type 2 Diabetes  If you are taking insulin, test at least 2 times per day. However, it is best to test before every insulin injection.  If you take medicines by mouth (orally), test 2 times a day.  If you are on a controlled diet, test once a day.  If your diabetes is not well controlled or if you are sick, you may need to monitor more often. HOW TO MONITOR YOUR BLOOD GLUCOSE Supplies Needed  Blood glucose meter.  Test strips for your meter. Each meter has its own strips. You must use the strips that go with your own meter.  A pricking needle (lancet).  A device that holds the lancet (lancing device).  A journal or log book to write down your results. Procedure  Wash your hands with soap and water. Alcohol is not preferred.  Prick the side of your finger (not the tip) with the lancet.  Gently milk the finger until a small drop of blood appears.  Follow the instructions that come with your meter for inserting the test strip, applying blood to the strip, and using your blood glucose meter. Other Areas to Get Blood for Testing Some meters allow you to use other areas of your body (other than your finger) to test your blood. These areas are called alternative sites. The most common alternative sites are:  The forearm.  The thigh.  The back area of the lower leg.  The palm of the hand. The blood flow in these areas is slower. Therefore, the blood glucose values you get may be  delayed, and the numbers are different from what you would get from your fingers. Do not use alternative sites if you think you are having hypoglycemia. Your reading will not be accurate. Always use a finger if you are having hypoglycemia. Also, if you cannot feel your lows (hypoglycemia unawareness), always use your fingers for your blood glucose checks. ADDITIONAL TIPS FOR GLUCOSE MONITORING  Do not reuse lancets.  Always carry your supplies with you.  All blood glucose meters have a 24-hour "hotline" number to call if you have questions or need help.  Adjust (calibrate) your blood glucose meter with a control solution after finishing a few boxes of strips. BLOOD GLUCOSE RECORD KEEPING It is a good idea to keep a daily record or log of your blood glucose readings. Most glucose meters, if not all, keep your glucose records stored in the meter. Some meters come with the ability to download your records to your home computer. Keeping a record of your blood glucose readings is especially helpful if you are wanting to look for patterns. Make notes to go along with the blood glucose readings because you might forget what happened at that exact time. Keeping good records helps you and your health care provider to work together to achieve good diabetes management.    This information is not intended to replace advice given to you by your health care provider. Make sure you discuss any questions you have with your health care provider.   Document Released: 11/28/2003 Document Revised: 12/16/2014 Document Reviewed: 04/19/2013 Elsevier Interactive Patient Education 2016 [employer]. Diabetes and Standards of Medical Care Diabetes is complicated. You may find that your diabetes team includes a dietitian, nurse, diabetes educator, eye doctor, and more. To help everyone know what is going on and to help you get the care you deserve, the following schedule of care was developed to help keep you on track.  Below are the tests, exams, vaccines, medicines, education, and plans you will need. HbA1c test This test shows how well you have controlled your glucose over the past 2-3 months. It is used to see if your diabetes management plan needs to be adjusted.   It is performed at least 2 times a year if you are meeting treatment goals.  It is performed 4 times a year if therapy has changed or if you are not meeting treatment goals. Blood pressure test  This test is performed at every routine medical visit. The goal is less than 140/90 mm Hg for most people, but 130/80 mm Hg in some cases. Ask your health care provider about your goal. Dental exam  Follow up with the dentist regularly. Eye exam  If you are diagnosed with type 1 diabetes as a child, get an exam upon reaching the age of 29 years or older and having had diabetes for 3-5 years. Yearly eye exams are recommended after that initial eye exam.  If  you are diagnosed with type 1 diabetes as an adult, get an exam within 5 years of diagnosis and then yearly.  If you are diagnosed with type 2 diabetes, get an exam as soon as possible after the diagnosis and then yearly. Foot care exam  Visual foot exams are performed at every routine medical visit. The exams check for cuts, injuries, or other problems with the feet.  You should have a complete foot exam performed every year. This exam includes an inspection of the structure and skin of your feet, a check of the pulses in your feet, and a check of the sensation in your feet.  Type 1 diabetes: The first exam is performed 5 years after diagnosis.  Type 2 diabetes: The first exam is performed at the time of diagnosis.  Check your feet nightly for cuts, injuries, or other problems with your feet. Tell your health care provider if anything is not healing. Kidney function test (urine microalbumin)  This test is performed once a year.  Type 1 diabetes: The first test is performed 5 years after  diagnosis.  Type 2 diabetes: The first test is performed at the time of diagnosis.  A serum creatinine and estimated glomerular filtration rate (eGFR) test is done once a year to assess the level of chronic kidney disease (CKD), if present. Lipid profile (cholesterol, HDL, LDL, triglycerides)  Performed every 5 years for most people.  The goal for LDL is less than 100 mg/dL. If you are at high risk, the goal is less than 70 mg/dL.  The goal for HDL is 40 mg/dL-50 mg/dL for men and 50 mg/dL-60 mg/dL for women. An HDL cholesterol of 60 mg/dL or higher gives some protection against heart disease.  The goal for triglycerides is less than 150 mg/dL. Immunizations  The flu (influenza) vaccine is recommended yearly for every person 59 months of age or older who has diabetes.  The pneumonia (pneumococcal) vaccine is recommended for every person 8 years of age or older who has diabetes. Adults 70 years of age or older may receive the pneumonia vaccine as a series of two separate shots.  The hepatitis B vaccine is recommended for adults shortly after they have been diagnosed with diabetes.  The Tdap (tetanus, diphtheria, and pertussis) vaccine should be given:  According to normal childhood vaccination schedules, for children.  Every 10 years, for adults who have diabetes. Diabetes self-management education  Education is recommended at diagnosis and ongoing as needed. Treatment plan  Your treatment plan is reviewed at every medical visit.   This information is not intended to replace advice given to you by your health care provider. Make sure you discuss any questions you have with your health care provider.   Document Released: 09/22/2009 Document Revised: 12/16/2014 Document Reviewed: 04/27/2013 Elsevier Interactive Patient Education 2016 [employer]. Diabetes Mellitus and Food It is important for you to manage your blood sugar (glucose) level. Your blood glucose level can be greatly  affected by what you eat. Eating healthier foods in the appropriate amounts throughout the day at about the same time each day will help you control your blood glucose level. It can also help slow or prevent worsening of your diabetes mellitus. Healthy eating may even help you improve the level of your blood pressure and reach or maintain a healthy weight.  General recommendations for healthful eating and cooking habits include:  Eating meals and snacks regularly. Avoid going long periods of time without eating to lose weight.  Eating a diet that consists mainly of plant-based foods, such as fruits, vegetables, nuts, legumes, and whole grains.  Using low-heat cooking methods, such as baking, instead of high-heat cooking methods, such as deep frying. Work with your dietitian to make sure you understand how to use the Nutrition Facts information on food labels. HOW CAN FOOD AFFECT ME? Carbohydrates Carbohydrates affect your blood glucose level more than any other type of food. Your dietitian will help you determine how many carbohydrates to eat at each meal and teach you how to count carbohydrates. Counting carbohydrates is important to keep your blood glucose at a healthy level, especially if you are using insulin or taking certain medicines for diabetes mellitus. Alcohol Alcohol can cause sudden decreases in blood glucose (hypoglycemia), especially if you use insulin or take certain medicines for diabetes mellitus. Hypoglycemia can be a life-threatening condition. Symptoms of hypoglycemia (sleepiness, dizziness, and disorientation) are similar to symptoms of having too much alcohol.  If your health care provider has given you approval to drink alcohol, do so in moderation and use the following guidelines:  Women should not have more than one drink per day, and men should not have more than two drinks per day. One drink is equal to:  12 oz of beer.  5 oz of wine.  1 oz of hard liquor.  Do not  drink on an empty stomach.  Keep yourself hydrated. Have water, diet soda, or unsweetened iced tea.  Regular soda, juice, and other mixers might contain a lot of carbohydrates and should be counted. WHAT FOODS ARE NOT RECOMMENDED? As you make food choices, it is important to remember that all foods are not the same. Some foods have fewer nutrients per serving than other foods, even though they might have the same number of calories or carbohydrates. It is difficult to get your body what it needs when you eat foods with fewer nutrients. Examples of foods that you should avoid that are high in calories and carbohydrates but low in nutrients include:  Trans fats (most processed foods list trans fats on the Nutrition Facts label).  Regular soda.  Juice.  Candy.  Sweets, such as cake, pie, doughnuts, and cookies.  Fried foods. WHAT FOODS CAN I EAT? Eat nutrient-rich foods, which will nourish your body and keep you healthy. The food you should eat also will depend on several factors, including:  The calories you need.  The medicines you take.  Your weight.  Your blood glucose level.  Your blood pressure level.  Your cholesterol level. You should eat a variety of foods, including:  Protein.  Lean cuts of meat.  Proteins low in saturated fats, such as fish, egg whites, and beans. Avoid processed meats.  Fruits and vegetables.  Fruits and vegetables that may help control blood glucose levels, such as apples, mangoes, and yams.  Dairy products.  Choose fat-free or low-fat dairy products, such as milk, yogurt, and cheese.  Grains, bread, pasta, and rice.  Choose whole grain products, such as multigrain bread, whole oats, and brown rice. These foods may help control blood pressure.  Fats.  Foods containing healthful fats, such as nuts, avocado, olive oil, canola oil, and fish. DOES EVERYONE WITH DIABETES MELLITUS HAVE THE SAME MEAL PLAN? Because every person with diabetes  mellitus is different, there is not one meal plan that works for everyone. It is very important that you meet with a dietitian who will help you create a meal plan that is just right for  you.   This information is not intended to replace advice given to you by your health care provider. Make sure you discuss any questions you have with your health care provider.   Document Released: 08/22/2005 Document Revised: 12/16/2014 Document Reviewed: 10/22/2013 Elsevier Interactive Patient Education [employer].

## 2016-10-17 LAB — FRUCTOSAMINE: FRUCTOSAMINE: 283 umol/L — AB (ref 190–270)

## 2016-10-23 ENCOUNTER — Encounter: Payer: Self-pay | Admitting: Family Medicine

## 2016-12-04 ENCOUNTER — Ambulatory Visit
Admission: RE | Admit: 2016-12-04 | Discharge: 2016-12-04 | Disposition: A | Discharge status: Home / Self Care | Payer: 59 | Source: Ambulatory Visit | Attending: Family Medicine | Admitting: Family Medicine

## 2016-12-04 DIAGNOSIS — B181 Chronic viral hepatitis B without delta-agent: Secondary | ICD-10-CM

## 2016-12-07 ENCOUNTER — Ambulatory Visit (INDEPENDENT_AMBULATORY_CARE_PROVIDER_SITE_OTHER): Payer: 59 | Admitting: Family Medicine

## 2016-12-07 VITALS — BP 142/90 | HR 105 | Temp 101.0°F | Resp 16 | Ht 67.0 in | Wt 168.2 lb

## 2016-12-07 DIAGNOSIS — Z5181 Encounter for therapeutic drug level monitoring: Secondary | ICD-10-CM

## 2016-12-07 DIAGNOSIS — B182 Chronic viral hepatitis C: Secondary | ICD-10-CM

## 2016-12-07 DIAGNOSIS — R772 Abnormality of alphafetoprotein: Secondary | ICD-10-CM

## 2016-12-07 DIAGNOSIS — E1169 Type 2 diabetes mellitus with other specified complication: Secondary | ICD-10-CM

## 2016-12-07 DIAGNOSIS — B181 Chronic viral hepatitis B without delta-agent: Secondary | ICD-10-CM | POA: Diagnosis not present

## 2016-12-07 DIAGNOSIS — R509 Fever, unspecified: Secondary | ICD-10-CM | POA: Diagnosis not present

## 2016-12-07 DIAGNOSIS — R160 Hepatomegaly, not elsewhere classified: Secondary | ICD-10-CM

## 2016-12-07 DIAGNOSIS — R634 Abnormal weight loss: Secondary | ICD-10-CM

## 2016-12-07 DIAGNOSIS — C229 Malignant neoplasm of liver, not specified as primary or secondary: Secondary | ICD-10-CM

## 2016-12-07 LAB — POCT CBC
Granulocyte percent: 78.3 %G (ref 37–80)
HEMATOCRIT: 36.4 % — AB (ref 43.5–53.7)
Hemoglobin: 12.7 g/dL — AB (ref 14.1–18.1)
LYMPH, POC: 2.4 (ref 0.6–3.4)
MCH, POC: 26.2 pg — AB (ref 27–31.2)
MCHC: 35 g/dL (ref 31.8–35.4)
MCV: 74.9 fL — AB (ref 80–97)
MID (CBC): 0.2 (ref 0–0.9)
MPV: 7.9 fL (ref 0–99.8)
POC GRANULOCYTE: 9.5 — AB (ref 2–6.9)
POC LYMPH %: 20.1 % (ref 10–50)
POC MID %: 1.6 %M (ref 0–12)
Platelet Count, POC: 341 10*3/uL (ref 142–424)
RBC: 4.86 M/uL (ref 4.69–6.13)
RDW, POC: 13.9 %
WBC: 12.1 10*3/uL — AB (ref 4.6–10.2)

## 2016-12-07 LAB — POCT URINALYSIS DIP (MANUAL ENTRY)
Blood, UA: NEGATIVE
Glucose, UA: NEGATIVE
Ketones, POC UA: NEGATIVE
LEUKOCYTES UA: NEGATIVE
NITRITE UA: NEGATIVE
PH UA: 7
Spec Grav, UA: 1.02
Urobilinogen, UA: 1

## 2016-12-07 LAB — POC MICROSCOPIC URINALYSIS (UMFC)

## 2016-12-07 LAB — POCT GLYCOSYLATED HEMOGLOBIN (HGB A1C): HEMOGLOBIN A1C: 7.1

## 2016-12-07 NOTE — Patient Instructions (Addendum)
IF you received an x-ray today, you will receive an invoice from [facility] Radiology. Please contact [facility]iology at [phone] with questions or concerns regarding your invoice.   IF you received labwork today, you will receive an invoice from [place]. Please contact [facility] at [phone] with questions or concerns regarding your invoice.   Our billing staff will not be able to assist you with questions regarding bills from these companies.  You will be contacted with the lab results as soon as they are available. The fastest way to get your results is to activate your My Chart account. Instructions are located on the last page of this paperwork. If you have not heard from [place] regarding the results in 2 weeks, please contact this office.    Liver Abscess Introduction A liver abscess is an infected area inside the liver that contains a collection of pus. The liver is a large organ in the upper right-hand side of the abdomen. It is involved in many important functions, including storing energy, producing fluids that the body needs, and removing harmful substances from the bloodstream. Liver abscesses often cause abdominal pain, fever, and other symptoms. A liver abscess can be dangerous if not treated. What are the causes? This condition may be caused by a bacterial infection or an infection with a parasite that is called an ameba (Entamoeba histolytica). In rare cases, it can also be caused by infection with a fungus that is called Candida. Many liver abscesses occur when infections spread to the liver from other parts of the abdomen, such as the appendix (appendicitis), the large intestine (diverticulitis), or the gallbladder (cholecystitis). Infections can also spread to the liver from other parts of the body through the bloodstream. Sometimes, the liver can become infected after abdominal surgery or a penetrating injury to the abdomen. What are the signs or  symptoms? Symptoms of this condition often come on slowly over many days or a few weeks. The most common symptoms are fever and pain in the upper right part of the abdomen. Other symptoms include:  Pain that goes up into the right shoulder.  A general ill feeling (malaise).  Loss of appetite.  Weight loss.  Chills.  Throwing up (vomiting) or feeling sick to your stomach (nauseous).  Diarrhea. How is this diagnosed? This condition may be diagnosed with:  A physical exam and medical history.  Blood tests to check for infections.  Imaging tests, such as CT scans or ultrasounds. Doing imaging tests is often the best way to find an abscess.  Needle aspiration. In this procedure, a long needle is put through the skin and into the liver. A sample of pus is drained out through the needle. The sample is studied under a microscope, and a culture test is performed. How is this treated? Treatment for this condition depends on the cause of the infection.  If the abscess was caused by bacteria, treatment usually involves having the pus drained and taking antibiotic medicines. At first, you may need to receive antibiotics that are given directly into a vein through an IV tube. For a severe infection, you may need to be hospitalized. The pus may be drained from the abscess through needle aspiration or by making an incision in the abscess.  If the abscess was caused by an ameba or a fungus, drainage of the abscess is usually not needed. Antimicrobial medicines are normally used to treat those infections. Follow these instructions at home:  Take medicines only as directed by  your health care provider.  If you were prescribed an antibiotic medicine, finish all of it even if you start to feel better.  Rest as much as possible and get plenty of sleep.  Avoid strenuous activity. Return to your normal activities as directed by your health care provider.  Do not lift anything that is heavier than  10 lb (4.5 kg).  Do not drink alcohol until your health care provider approves.  Keep all follow-up visits as directed by your health care provider. This is important. Contact a health care provider if:  You have pain in your abdomen.  You do not feel like eating.  You feel nauseous.  You vomit.  You have diarrhea.  You have a fever.  You have chills or sweats. Get help right away if:  Your abdominal pain suddenly gets worse.  Your skin or your eyes look yellow.  You develop confusion. This information is not intended to replace advice given to you by your health care provider. Make sure you discuss any questions you have with your health care provider. Document Released: 03/12/2011 Document Revised: 05/02/2016 Document Reviewed: 11/21/2014  2017 Elsevier Hepatitis B Hepatitis B is a viral infection of the liver. There are two kinds of hepatitis B:  Acute hepatitis B. Hepatitis B that lasts six months or less is called acute hepatitis B.  Chronic hepatitis B. Hepatitis B that lasts more than six months is called long-term (chronic) hepatitis B. Chronic hepatitis B can lead to liver failure, scarring of the liver (cirrhosis), or liver cancer. Acute hepatitis B can turn into chronic hepatitis B. Most adults with acute hepatitis B do not develop chronic hepatitis B. Infants and young children who get hepatitis B are more likely to develop chronic hepatitis B than adults who get hepatitis B. What are the causes? Hepatitis B is caused by the hepatitis B virus (HBV). The virus is passed from one person to another through blood, birth, sex, or bodily fluids, such as:  Breast milk.  Tears.  Saliva. What increases the risk? The risk factors for hepatitis B include:  Having unprotected sex with someone who is infected.  Using drugs with needles. What are the signs or symptoms? Symptoms of hepatitis B may include:  Loss of  appetite.  Fatigue.  Nausea.  Vomiting.  Stomach pain.  Dark yellow urine.  Yellowish skin and eyes (jaundice). Hepatitis B does not always cause symptoms. How is this diagnosed? Your health care provider will do a blood test to diagnose hepatitis B. How is this treated? You will need to prevent further injury to the liver by avoiding alcohol and medicines that can be hard for the liver to metabolize. Treatment for chronic hepatitis B may include antiviral medicine. This medicine may help:  Lower your risk of liver failure.  Lower your ability to infect others with hepatitis B.  Lower your risk of scarring your liver (cirrhosis).  Lower your risk of liver cancer. Follow these instructions at home:  Rest as needed.  Avoid alcohol.  Take medicines only as directed by your health care provider.  Do not take any medicine without approval from your health care provider. This includes over-the-counter medicine that is usually taken for fever or pain.  Do not have sex unless approved by your health care provider.  Do not share toothbrushes, nail clippers, razors, or needles with others. Get help right away if:  You are unable to eat or drink.  You have a fever along with nausea  or vomiting.  You feel confused.  You develop jaundice or your chronic jaundice becomes more severe.  You have trouble breathing.  You develop a rash.  Your skin, throat, mouth, or face becomes swollen.  Your body shakes or twitches (seizure).  You become very sleepy or have trouble waking up. This information is not intended to replace advice given to you by your health care provider. Make sure you discuss any questions you have with your health care provider. Document Released: 11/22/2000 Document Revised: 05/02/2016 Document Reviewed: 03/04/2014 Elsevier Interactive Patient Education  2017 [employer].

## 2016-12-07 NOTE — Progress Notes (Signed)
Subjective:  By signing my name below, I, Justin [name], attest that this documentation has been prepared under the direction and in the presence of Delman Cheadle, MD. Electronically Signed: Moises [name], Justin [name]. 12/07/2016 , 9:31 AM .  Patient was seen in Room 10 .   Patient ID: Justin [name], male    DOB: 02/04/1961, 55 y.o.   MRN: [id number] Chief Complaint  Patient presents with  . Follow-up    Diabetes and liver ultrasound  . Fever    Pt states he has been having a fever every other day. 101.0 T today   HPI Justin [name] is a 55 y.o. male who presents to [facility] for follow up on diabetes and liver ultrasound. He also mentioned having fever every other day during triage, with measured Tmax 101. His ultrasound showed 1cm mass in the back right side of liver. His Hep B was diagnosed during his annual physical in Sept 2016. He mentions having Hep C 30 years ago.   Patient recently returned from [place] visiting family, about 2~3 weeks ago. However, he reports having intermittent fever prior to [place] trip. He also notes fever not present at last visit with me on 10/15/16 visit. He states it occurs once a week and lasts for a day. He would take Aleve for relief without returning spike within that day. It would come with headache, fatigue and cough with exertion and laying down. He was given benzonatate in Sept for cough, but he did not do well with it.   He also informs having right sided abdominal pain. His urine has usually been normal, but more yellow than usual. His bowels have been normal. He's made a lot of diet changes. In the past 3 months, he's lost about 11 lbs. He denies swelling in his lower extremities. He denies diarrhea.   Past Medical History:  Diagnosis Date  . Chronic hepatitis B virus infection ([place]) 02/15/2004   Annotation: 02/15/2004:  +Core Ab, +SAg, -SAb, -Be Ag with elevated liver  enzymes HBV DNA via PCR:  <2.3 AFP normal Evaluated by [facility]  07/24/2004--Dr. Patsy [place] Qualifier: Diagnosis of  By: Amil Amen MD, Benjamine Mola    . High cholesterol 2006  . Hyperlipidemia   . Hypertension   . Prediabetes 08/31/2016   Lab Results Component Value Date  HGBA1C 6.2 (H) 08/30/2015     Prior to Admission medications   Medication Sig Start Date End Date Taking? Authorizing Provider  amLODipine (NORVASC) 5 MG tablet Take 1 tablet (5 mg total) by mouth daily. 08/31/16  Yes Gregor Hams, MD  lisinopril-hydrochlorothiazide (ZESTORETIC) 20-12.5 MG tablet Take 1 tablet by mouth daily. 08/31/16  Yes Gregor Hams, MD  lovastatin (MEVACOR) 20 MG tablet Take 1 tablet (20 mg total) by mouth at bedtime. 08/31/16  Yes Gregor Hams, MD  aspirin EC 81 MG tablet Take 1 tablet (81 mg total) by mouth daily. Patient not taking: Reported on 12/07/2016 10/15/16   Shawnee Knapp, MD  [name] glucose meter kit and supplies KIT Dispense based on patient and insurance preference. Use up to four times daily as directed. (FOR ICD-9 250.00, 250.01). Patient not taking: Reported on 12/07/2016 10/15/16   Shawnee Knapp, MD  mupirocin ointment (BACTROBAN) 2 % Apply 1 application topically 3 (three) times daily. Patient not taking: Reported on 12/07/2016 09/30/16   Robyn Haber, MD   No Known Allergies   Review of Systems  Constitutional: Positive for fatigue and fever. Negative for unexpected weight  change.  Eyes: Negative for visual disturbance.  Respiratory: Positive for cough. Negative for chest tightness and shortness of breath.   Cardiovascular: Negative for chest pain, palpitations and leg swelling.  Gastrointestinal: Positive for abdominal pain. Negative for abdominal distention and [name] in stool.  Genitourinary: Negative for dysuria, frequency and hematuria.  Neurological: Positive for headaches. Negative for dizziness and light-headedness.       Objective:   Physical Exam  Constitutional: He is oriented to person, place, and time. He appears well-developed and  well-nourished. No distress.  HENT:  Head: Normocephalic and atraumatic.  Eyes: EOM are normal. Pupils are equal, round, and reactive to light.  Neck: Neck supple.  Cardiovascular: Normal rate.   Pulmonary/Chest: Effort normal. No respiratory distress.  Musculoskeletal: Normal range of motion.  Neurological: He is alert and oriented to person, place, and time.  Skin: Skin is warm and dry.  Psychiatric: He has a normal mood and affect. His behavior is normal.  Nursing note and vitals reviewed.   BP (!) 142/90   Pulse (!) 105   Temp (!) 101 F (38.3 C) (Oral)   Resp 16   Ht _0  (1.702 m)   Wt 168 lb 3.2 oz (76.3 kg)   SpO2 98%   BMI 26.34 kg/m    Results for orders placed or performed in visit on 12/07/16  POCT CBC  Result Value Ref Range   WBC 12.1 (A) 4.6 - 10.2 K/uL   Lymph, poc 2.4 0.6 - 3.4   POC LYMPH PERCENT 20.1 10 - 50 %L   MID (cbc) 0.2 0 - 0.9   POC MID % 1.6 0 - 12 %M   POC Granulocyte 9.5 (A) 2 - 6.9   Granulocyte percent 78.3 37 - 80 %G   RBC 4.86 4.69 - 6.13 M/uL   Hemoglobin 12.7 (A) 14.1 - 18.1 g/dL   HCT, POC 36.4 (A) 43.5 - 53.7 %   MCV 74.9 (A) 80 - 97 fL   MCH, POC 26.2 (A) 27 - 31.2 pg   MCHC 35.0 31.8 - 35.4 g/dL   RDW, POC 13.9 %   Platelet Count, POC 341 142 - 424 K/uL   MPV 7.9 0 - 99.8 fL  POCT glycosylated hemoglobin (Hb A1C)  Result Value Ref Range   Hemoglobin A1C 7.1   POCT urinalysis dipstick  Result Value Ref Range   Color, UA orange (A) yellow   Clarity, UA clear clear   Glucose, UA negative negative   Bilirubin, UA small (A) negative   Ketones, POC UA negative negative   Spec Grav, UA 1.020    [name], UA negative negative   pH, UA 7.0    Protein Ur, POC =30 (A) negative   Urobilinogen, UA 1.0    Nitrite, UA Negative Negative   Leukocytes, UA Negative Negative  POCT Microscopic Urinalysis ([facility])  Result Value Ref Range   WBC,UR,HPF,POC Few (A) None WBC/hpf   RBC,UR,HPF,POC None None RBC/hpf   Bacteria Few (A) None, Too  numerous to count   Mucus Present (A) Absent   Epithelial Cells, UR Per Microscopy Few (A) None, Too numerous to count cells/hpf       Assessment & Plan:   1. Fever, unspecified fever cause - pt reports temps up to 101 spiking for 1 day every 1-2 weeks for the past month.  I saw pt 6 wks prior and he reports that this was NOT occurring at that time.  Fever resolves with aleve and no  other asstd sxs.  However, pt states that [employer] PREDATED his recent trip to visit family in [place] for 2-3 wks, returned 2 wks prior, but does make trips not infreq. Was in a large city and notes no particular animal exposure. Very much appreciated Dr. Storm Frisk guidance in his work-up. Ok to cont prn aleve for [employer]. RTC immed if worsen or do not respond. Will follow closely. Go to ER if febrile >5d in a row or acutely unstable.  2. Type 2 diabetes mellitus with other specified complication, without long-term current use of insulin (HCC) - new diagnosis approx 3 mos prior with a1c 7.4 - pt has made excellent diet changes - cut out sodas and refined sugar so holding off on starting meds. a1c only improved to 7.1 today.  3. Medication monitoring encounter   4. Chronic hepatitis C without hepatic coma ([place]) - pt reports distant h/o treatment with negative viral load so assume this has cleared  5. Liver mass 1.2 cm in right posterior lobe - concerning for hepatoma vs HCC with active hepatitis B vs abscess.  Appreciate Dr. Storm Frisk help today, stat liver MRI with contrast ordered for further eval, refer to ID and GI for additional help in diagnosis  6. Chronic viral hepatitis B without delta agent and without coma (HCC) - refer to ID hepatitis clinic  7. Loss of weight - 10lbs in past sev mos. Pt reports + lifestyle change that he attributes this to due to new diagnosis of DM. However, lack of sig improvement in a1c or glucose today makes me concerned this is from poss neoplastic vs infectious liver mass.   Unfortunately,  in the midst of reviewing the concerning liver US results and fever with the pt and Dr. Baxter Flattery I did not examine the pt other than noting that he was having RUQ fullness and tenderness.  Upon detailed questioning, he did report new DOE and orthopnea and so does need repeat cardiopulm eval likely with EKG and CXR - will ask that pt RTC for this asap.   Orders Placed This Encounter  Procedures  . Influenza a and b  . Malaria smear  . Culture, [name] (single) w Reflex to ID Panel  . Culture, [name] (single)  . Culture, [name] (single)  . MR LIVER W WO CONTRAST    Standing Status:   Future    Standing Expiration Date:   02/05/2018    Order Specific Question:   If indicated for the ordered procedure, I authorize the administration of contrast media per Radiology protocol    Answer:   Yes    Order Specific Question:   Reason for Exam (SYMPTOM  OR DIAGNOSIS REQUIRED)    Answer:   mass seen on liver US concerning for hepatoma    Order Specific Question:   Preferred imaging location?    Answer:   [facility] (table limit-550lbs)    Order Specific Question:   What is the patient's sedation requirement?    Answer:   No Sedation    Order Specific Question:   Does the patient have a pacemaker or implanted devices?    Answer:   No  . Comprehensive metabolic panel  . Parasite Exam, [name]  . AFP tumor marker  . Gamma GT  . Lipase  . Sedimentation rate  . C-reactive protein  . Ambulatory referral to Gastroenterology    Referral Priority:   Urgent    Referral Type:   Consultation    Referral Reason:  Specialty Services Required    Number of Visits Requested:   1  . Ambulatory referral to Infectious Disease    Referral Priority:   Urgent    Referral Type:   Consultation    Referral Reason:   Specialty Services Required    Requested Specialty:   Infectious Diseases    Number of Visits Requested:   1  . POCT CBC  . POCT glycosylated hemoglobin (Hb A1C)  . POCT urinalysis dipstick  . POCT  Microscopic Urinalysis ([facility])     I personally performed the services described in this documentation, which was scribed in my presence. The recorded information has been reviewed and considered, and addended by me as needed.   Delman Cheadle, M.D.  [facility] [address] [place], [place] [address] [phone] phone [phone] fax  12/08/16 4:20 PM

## 2016-12-08 LAB — COMPREHENSIVE METABOLIC PANEL
ALBUMIN: 3.5 g/dL (ref 3.5–5.5)
ALK PHOS: 86 IU/L (ref 39–117)
ALT: 19 IU/L (ref 0–44)
AST: 24 IU/L (ref 0–40)
Albumin/Globulin Ratio: 0.9 — ABNORMAL LOW (ref 1.2–2.2)
BILIRUBIN TOTAL: 0.8 mg/dL (ref 0.0–1.2)
BUN / CREAT RATIO: 10 (ref 9–20)
BUN: 9 mg/dL (ref 6–24)
CHLORIDE: 97 mmol/L (ref 96–106)
CO2: 24 mmol/L (ref 18–29)
CREATININE: 0.89 mg/dL (ref 0.76–1.27)
Calcium: 9.3 mg/dL (ref 8.7–10.2)
GFR calc Af Amer: 110 mL/min/{1.73_m2} (ref >59)
GFR calc non Af Amer: 95 mL/min/{1.73_m2} (ref >59)
GLOBULIN, TOTAL: 4.1 g/dL (ref 1.5–4.5)
GLUCOSE: 221 mg/dL — AB (ref 65–99)
Potassium: 3.5 mmol/L (ref 3.5–5.2)
SODIUM: 139 mmol/L (ref 134–144)
Total Protein: 7.6 g/dL (ref 6.0–8.5)

## 2016-12-09 LAB — SPECIMEN STATUS REPORT

## 2016-12-10 ENCOUNTER — Other Ambulatory Visit: Payer: Self-pay

## 2016-12-10 LAB — PARASITE EXAM, BLOOD

## 2016-12-10 LAB — INFLUENZA A AND B
INFLUENZA A AG, EIA: NEGATIVE
Influenza B Ag, EIA: NEGATIVE

## 2016-12-10 LAB — SEDIMENTATION RATE: SED RATE: 75 mm/h — AB (ref 0–30)

## 2016-12-10 LAB — LIPASE: Lipase: 49 U/L (ref 13–78)

## 2016-12-10 LAB — C-REACTIVE PROTEIN: CRP: 192.7 mg/L — ABNORMAL HIGH (ref 0.0–4.9)

## 2016-12-10 LAB — AFP TUMOR MARKER: AFP TUMOR MARKER: 58664 ng/mL — AB (ref 0.0–8.3)

## 2016-12-10 LAB — GAMMA GT: GGT: 63 IU/L (ref 0–65)

## 2016-12-10 LAB — PLEASE NOTE:

## 2016-12-10 NOTE — Progress Notes (Signed)
Pt advised and tx up front for an appointment

## 2016-12-11 LAB — CULTURE, BLOOD (SINGLE)

## 2016-12-11 NOTE — Addendum Note (Signed)
Addended by: Delman Cheadle on: 12/11/2016 05:20 PM   Modules accepted: Orders

## 2016-12-12 ENCOUNTER — Ambulatory Visit
Admission: RE | Admit: 2016-12-12 | Discharge: 2016-12-12 | Disposition: A | Discharge status: Home / Self Care | Payer: 59 | Source: Ambulatory Visit | Attending: Family Medicine | Admitting: Family Medicine

## 2016-12-12 ENCOUNTER — Ambulatory Visit: Payer: 59 | Admitting: Family Medicine

## 2016-12-12 ENCOUNTER — Ambulatory Visit (INDEPENDENT_AMBULATORY_CARE_PROVIDER_SITE_OTHER): Payer: 59 | Admitting: Nurse Practitioner

## 2016-12-12 ENCOUNTER — Encounter: Payer: Self-pay | Admitting: Nurse Practitioner

## 2016-12-12 VITALS — BP 148/64 | HR 84 | Ht 66.0 in | Wt 167.0 lb

## 2016-12-12 DIAGNOSIS — B181 Chronic viral hepatitis B without delta-agent: Secondary | ICD-10-CM | POA: Diagnosis not present

## 2016-12-12 DIAGNOSIS — R160 Hepatomegaly, not elsewhere classified: Secondary | ICD-10-CM | POA: Diagnosis not present

## 2016-12-12 MED ORDER — GADOXETATE DISODIUM 0.25 MMOL/ML IV SOLN: 8.0000 mL | Freq: Once | INTRAVENOUS | Status: DC | PRN

## 2016-12-12 NOTE — Progress Notes (Addendum)
HPI: Patient is a 56 yo male from [place] referred by PCP, Dr. Delman Cheadle, for a liver mass. Patient gives a history of HBV and HCV infection. States he was treated for HBV many years ago. Labs actually show negative HCV viral count and + chronic HBV. Patient travels back and forth from [place]. Recently seen by PCP for check up. Patient had some mild RUQ discomfort and recent weight loss. U/S for [facility] screening showed a large mass in right liver lobe. MRI yesterday confirms a 12 cm mass in the right lobe of the liver. AFP 57,000. LFTs are normal.    Past Medical History:  Diagnosis Date  . Chronic hepatitis B virus infection ([place]) 02/15/2004   Annotation: 02/15/2004:  +Core Ab, +SAg, -SAb, -Be Ag with elevated liver  enzymes HBV DNA via PCR:  <2.3 AFP normal Evaluated by [facility] 07/24/2004--Dr. Patsy [place] Qualifier: Diagnosis of  By: Amil Amen MD, Benjamine Mola    . High cholesterol 2006  . Hyperlipidemia   . Hypertension   . Prediabetes 08/31/2016   Lab Results Component Value Date  HGBA1C 6.2 (H) 08/30/2015      History reviewed. No pertinent surgical history. Family History  Problem Relation Age of Onset  . Heart disease Father   . Hyperlipidemia Father    Social History  Substance Use Topics  . Smoking status: Never Smoker  . Smokeless tobacco: Never Used  . Alcohol use No   Current Outpatient Prescriptions  Medication Sig Dispense Refill  . amLODipine (NORVASC) 5 MG tablet Take 1 tablet (5 mg total) by mouth daily. 90 tablet 3  . aspirin EC 81 MG tablet Take 1 tablet (81 mg total) by mouth daily.    . blood glucose meter kit and supplies KIT Dispense based on patient and insurance preference. Use up to four times daily as directed. (FOR ICD-9 250.00, 250.01). 1 each 0  . lisinopril-hydrochlorothiazide (ZESTORETIC) 20-12.5 MG tablet Take 1 tablet by mouth daily. 90 tablet 3  . lovastatin (MEVACOR) 20 MG tablet Take 1 tablet (20 mg total) by mouth at bedtime. 90 tablet 3    No current facility-administered medications for this visit.    Facility-Administered Medications Ordered in Other Visits  Medication Dose Route Frequency Provider Last Rate Last Dose  . gadoxetate disodium (EOVIST) injection 8 mL  8 mL Intravenous Once PRN Shawnee Knapp, MD       No Known Allergies   Review of Systems: All systems reviewed and negative except where noted in HPI.    Physical Exam: BP (!) 148/64   Pulse 84   Ht '5\' 6"'  (1.676 m)   Wt 167 lb (75.8 kg)   BMI 26.95 kg/m  Constitutional:  Thin black male in no acute distress. Psychiatric: Normal mood and affect. Behavior is normal. HEENT: Normocephalic and atraumatic. Conjunctivae are normal. No scleral icterus. Neck supple.  Cardiovascular: Normal rate, regular rhythm.  Pulmonary/chest: Effort normal and breath sounds normal. No wheezing, rales or rhonchi. Abdominal: Soft, nondistended, nontender. Bowel sounds active throughout. There are no masses palpable. No hepatomegaly. Extremities: no edema Lymphadenopathy: No cervical adenopathy noted. Neurological: Alert and oriented to person place and time. Skin: Skin is warm and dry. No rashes noted.   ASSESSMENT AND PLAN:  1. 56 yo male with large right liver mass ( 12cm) and markedly elevated AFP. Likely HCC. No cirrhosis on imaging but he has chronic HBV which is risk factor for [place]. I spoke with patient's PCP, Dr.  Brigitte Pulse, on phone today as she had ordered the MRI and referred her to [place]. Her office is in process of referring patient to Oncology.  -Explained findings to the patient. Emotionally seemed to handle the news okay. He understands that the next step will be to meet with Oncology and will await the referral.   2. Hx of HCV, apparently self eradicated the virus as antibody + but no virus detected in blood.   Tye Savoy, NP  12/12/2016, 4:48 PM  Cc:  Shawnee Knapp, MD   Thank you for sending this case to me and for having me see him in clinic with you. I  have reviewed the entire note, and the outlined plan is what we discussed.  No need for HBV therapy at this juncture.  Oncology evaluation paramount.

## 2016-12-12 NOTE — Patient Instructions (Signed)
   We have been in contact with Dr Raul Del office and you don't need to go to that appointment today.    You will be contacted by oncology about an appointment.

## 2016-12-13 ENCOUNTER — Telehealth: Payer: Self-pay | Admitting: Nurse Practitioner

## 2016-12-14 LAB — CULTURE, BLOOD (SINGLE)

## 2016-12-16 ENCOUNTER — Ambulatory Visit (HOSPITAL_BASED_OUTPATIENT_CLINIC_OR_DEPARTMENT_OTHER): Payer: 59 | Admitting: Oncology

## 2016-12-16 ENCOUNTER — Telehealth: Payer: Self-pay | Admitting: *Deleted

## 2016-12-16 ENCOUNTER — Encounter: Payer: Self-pay | Admitting: *Deleted

## 2016-12-16 ENCOUNTER — Telehealth: Payer: Self-pay | Admitting: Oncology

## 2016-12-16 DIAGNOSIS — C220 Liver cell carcinoma: Secondary | ICD-10-CM | POA: Insufficient documentation

## 2016-12-16 DIAGNOSIS — R630 Anorexia: Secondary | ICD-10-CM

## 2016-12-16 DIAGNOSIS — R634 Abnormal weight loss: Secondary | ICD-10-CM

## 2016-12-16 DIAGNOSIS — B181 Chronic viral hepatitis B without delta-agent: Secondary | ICD-10-CM

## 2016-12-16 DIAGNOSIS — R05 Cough: Secondary | ICD-10-CM

## 2016-12-16 DIAGNOSIS — R000 Tachycardia, unspecified: Secondary | ICD-10-CM | POA: Diagnosis not present

## 2016-12-16 DIAGNOSIS — R509 Fever, unspecified: Secondary | ICD-10-CM

## 2016-12-16 MED ORDER — ACETAMINOPHEN 325 MG PO TABS
650.0000 mg | ORAL_TABLET | Freq: Once | ORAL | Status: AC
  Administered 2016-12-16: 650 mg via ORAL

## 2016-12-16 NOTE — Progress Notes (Signed)
Justin [name] New Patient Consult   Referring MD: Justin Knapp, Md [place], [place] [address]   Justin [name] 56 y.o.  05/09/1961    Reason for Referral: Hepatocellular carcinoma   HPI: Justin [name] was diagnosed with hepatitis B at the time of a physical exam in September. He saw Justin [name] in December and was referred for a liver ultrasound. In irregular solid mass was noted in the posterior right liver measuring 12 x 10 x 12 cm concerning for a hepatoma. No other hepatic lesions. He was referred for an MRI of the abdomen on 12/12/2016. A large mass was confirmed in the right liver involving segments 6 and 7. The mass measures 12.3 x 12.7 cm. Imaging findings are consistent with a malignancy. No retroperitoneal or periportal adenopathy.  He is referred for oncology evaluation. He complains of intermittent fever for the past several months. He has a cough for 6 months.  Past Medical History:  Diagnosis Date  . Chronic hepatitis B virus infection (Justin [name]) 02/15/2004   Annotation: 02/15/2004:  +Core Ab, +SAg, -SAb, -Be Ag with elevated liver  enzymes HBV DNA via PCR:  <2.3 AFP normal Evaluated by [facility] 07/24/2004--Dr. Patsy [name] Qualifier: Diagnosis of  By: Justin Amen MD, Justin [name]    . High cholesterol 2006  .    Marland Kitchen Hypertension   . Prediabetes 08/31/2016   Lab Results Component Value Date  HGBA1C 6.2 (H) 08/30/2015      Past surgical history: None  Medications: Reviewed  Allergies: No Known Allergies  Family history:  No family history of cancer  Social History:  He lives in [place]. 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 He does not use cigarettes or alcohol. No transfusion history. No risk factor for HIV or hepatitis. He had "hepatitis "while living in [place] in the 1980s   ROS:   Positives include: fever, sweats in September and October-now resolved, nonproductive cough, exertional dyspnea,  urinary frequency, nocturia, "dark "urine, anorexia, 15 pound weight loss  A complete ROS was otherwise negative.  Physical Exam:  [name] (!) 131, temperature (!) 101.3 F (38.5 C), temperature source Oral, resp. rate 17, height 5\' 6"  (1.676 m), weight 164 lb 3.2 oz (74.5 kg), SpO2 98 %.  HEENT: Oropharynx without visible mass, neck without mass Lungs: Clear bilaterally, no respiratory distress Cardiac: Regular rate and rhythm Abdomen: No hepatosplenomegaly, nontender, no apparent ascites, no splenomegaly GU: Testes without mass  Vascular: No leg edema Lymph nodes: No cervical, supraclavicular, axillary, or inguinal nodes Neurologic: Alert and oriented, the motor exam appears intact in the upper and lower extremities Skin: No rash Musculoskeletal: No spine tenderness   LAB:  CBC  Lab Results  Component Value Date   WBC 12.1 (A) 12/07/2016   HGB 12.7 (A) 12/07/2016   HCT 36.4 (A) 12/07/2016   MCV 74.9 (A) 12/07/2016   PLT 449 (H) 08/31/2016   NEUTROABS 1.5 (L) 12/21/2010     CMP      Component Value Date/Time   NA 139 12/07/2016 0838   K 3.5 12/07/2016 0838   CL 97 12/07/2016 0838   CO2 24 12/07/2016 0838   GLUCOSE 221 (H) 12/07/2016 0838   GLUCOSE 156 (H) 08/31/2016 0840   BUN 9 12/07/2016 0838   CREATININE 0.89 12/07/2016 0838   CREATININE 1.00 08/31/2016 0840   CALCIUM 9.3 12/07/2016 0838   PROT 7.6 12/07/2016 0838   ALBUMIN 3.5 12/07/2016 0838   AST  24 12/07/2016 0838   ALT 19 12/07/2016 0838   ALKPHOS 86 12/07/2016 0838   BILITOT 0.8 12/07/2016 0838   GFRNONAA 95 12/07/2016 0838   GFRAA 110 12/07/2016 0838    [id number]   Imaging:  MRI liver 12/12/2016-images reviewed with the patient and his family   Assessment/Plan:   1. Hepatocellular carcinoma  MRI abdomen 12/12/2016 consistent with a right hepatic HCC measuring 12.3 x 12.7 cm  Markedly elevated AFP  History of hepatitis B  2.   Chronic hepatitis B infection  3.   Fever  4.    Nonproductive cough  5.   Anorexia/weight loss   Disposition:   Justin [name] has a clinical presentation consistent with hepatocellular carcinoma. I discussed the diagnosis and treatment options with him. The cough may be related to diaphragmatic irritation from the large hepatic tumor or he could have lung metastases. He will be referred for a staging CT evaluation within the next 2 days.  I will present his case at the GI tumor conference on 12/18/2016. He will return for an office visit and further discussion on 12/18/2016.  We will make a surgical referral as indicated based on the CT evaluation and GI tumor conference discussion.  The fever and tachycardia improved after Tylenol given in the office today.  Approximately 50 minutes were spent with the patient today. The majority of the time was used for counseling and coordination of care.  Justin Coder, MD  12/16/2016, 4:58 PM

## 2016-12-16 NOTE — Progress Notes (Signed)
Oncology Nurse Navigator Documentation  Oncology Nurse Navigator Flowsheets 12/16/2016  Navigator Location [facility]  Referral date to RadOnc/MedOnc -  Navigator Encounter Type Initial MedOnc  Abnormal Finding Date -  Confirmed Diagnosis Date -  Patient Visit Type MedOnc;Initial  Treatment Phase Abnormal Scans;Pre-Tx/Tx Discussion  Barriers/Navigation Needs Education;Coordination of Care  Education Newly Diagnosed Cancer Education;Understanding Cancer/ Treatment Options  Interventions Education;Coordination of Care--scheduled CT scan and provided patient with date/time/prep and contrast w/directions. Message to managed care for PA  Coordination of Care Radiology  Education Method Verbal;Written;Teach-back  Support Groups/Services GI Support Group;[place];Other--[facility] Support Services  Acuity Level 2  Time Spent with Patient 60  Met with patient, wife and best friend/neighbor Nagala during new patient visit. Explained the role of the GI Nurse Navigator and provided New Patient Packet with information on: 1. Liver cancer--AFP testing, anatomy of liver, printed info on Nivolumab 2. Support groups 3. Advanced Directives 4. Fall Safety Plan Answered questions, reviewed current treatment plan using TEACH back and provided emotional support. Provided copy of current treatment plan. Added case to GI Conference on 12/18/16 per MD request. Scheduled his f/u visit with Dr. Benay Spice for 12/18/16 at 12:30. Report given to collaborative nurse to check vitals few minutes after his Tylenol was given per MD request.  Merceda Elks, RN, BSN GI Oncology [place]

## 2016-12-16 NOTE — Patient Instructions (Signed)
Care Plan Summary- 12/16/2016 Name: Justin [name]       DOB: 06-26-1961 Your Medical Team:  Medical Oncologist:  Dr. Ma Rings Radiation Oncologist:    Surgeon:    Type of Cancer: Hepatocellular Carcinoma ([place])  Stage/Grade: Undetermined *Exact staging of your cancer is based on size of the tumor, depth of invasion, involvement of lymph nodes or not, and whether or not the cancer has spread beyond the primary site.   Recommendations: Based on information available as of today's consult. Recommendations may change depending on the results of further tests or exams. 1) CT scan chest/abdomen/pelvis to complete staging (look for spread of cancer)       2)  Potential surgical resection or immunotherapy (Nivolumab)       3) Bring your FMLA/disability papers to appointment on 12/18/16 ___________________________________________________________________ Next Steps:  1) CT scan chest/abdomen/pelvis on 12/17/16 at 0800 (see instructions) 2) Will present case at GI Conference 12/18/16  3) Return to Dr. Benay Spice on 12/18/16 at 12:30 pm Questions? Merceda Elks, RN, BSN at [phone]. Manuela Schwartz is your Oncology Nurse Navigator and is available to assist you while you're receiving your [place].

## 2016-12-16 NOTE — Patient Instructions (Signed)
Care Plan Summary- 12/16/2016 Name: Laderrius Shelton       DOB: 03/13/1961 Your Medical Team:  Medical Oncologist:  Dr. Ma Rings Radiation Oncologist:    Surgeon:    Type of Cancer: Hepatocellular Carcinoma ([place])  Stage/Grade: Undetermined *Exact staging of your cancer is based on size of the tumor, depth of invasion, involvement of lymph nodes or not, and whether or not the cancer has spread beyond the primary site.   Recommendations: Based on information available as of today's consult. Recommendations may change depending on the results of further tests or exams. 1) CT scan chest/abdomen/pelvis to complete staging (look for spread of cancer)       2)  Potential surgical resection or immunotherapy (Nivolumab)       3) Bring your FMLA/disability papers to appointment on 12/18/16 ___________________________________________________________________ Next Steps:  1) CT scan chest/abdomen/pelvis on 12/17/16 at 0800 (see instructions) 2) Will present case at GI Conference 12/18/16  3) Return to Dr. Benay Spice on 12/18/16 at 12:30 pm Questions? Merceda Elks, RN, BSN at [phone]. Manuela Schwartz is your Oncology Nurse Navigator and is available to assist you while you're receiving your [place].

## 2016-12-16 NOTE — Telephone Encounter (Signed)
Oncology Nurse Navigator Documentation  Oncology Nurse Navigator Flowsheets 12/16/2016  Navigator Location [facility]  Referral date to RadOnc/MedOnc 12/13/2016  Navigator Encounter Type Introductory phone call  Abnormal Finding Date 12/04/2016  Confirmed Diagnosis Date 12/12/2016  Spoke with patient and provided new patient appointment for today, January 8th at 2 pm with Dr. Benay Spice. Informed of location of [facility], valet service, and registration process. Reminded to bring insurance cards and a current medication list, including supplements. Patient verbalizes understanding. HIM notified.

## 2016-12-16 NOTE — Progress Notes (Signed)
1700-OK to discharge patient to home per Dr. Benay Spice.

## 2016-12-16 NOTE — Telephone Encounter (Signed)
Patient already on schedule for 1/10 f/u and ct scan.

## 2016-12-17 ENCOUNTER — Encounter (HOSPITAL_COMMUNITY): Payer: Self-pay

## 2016-12-17 ENCOUNTER — Ambulatory Visit (HOSPITAL_COMMUNITY)
Admission: RE | Admit: 2016-12-17 | Discharge: 2016-12-17 | Disposition: A | Discharge status: Home / Self Care | Payer: 59 | Source: Ambulatory Visit | Attending: Oncology | Admitting: Oncology

## 2016-12-17 DIAGNOSIS — R590 Localized enlarged lymph nodes: Secondary | ICD-10-CM | POA: Insufficient documentation

## 2016-12-17 DIAGNOSIS — N400 Enlarged prostate without lower urinary tract symptoms: Secondary | ICD-10-CM | POA: Diagnosis not present

## 2016-12-17 DIAGNOSIS — R918 Other nonspecific abnormal finding of lung field: Secondary | ICD-10-CM | POA: Diagnosis not present

## 2016-12-17 DIAGNOSIS — C220 Liver cell carcinoma: Secondary | ICD-10-CM | POA: Diagnosis present

## 2016-12-17 DIAGNOSIS — J9811 Atelectasis: Secondary | ICD-10-CM | POA: Insufficient documentation

## 2016-12-17 HISTORY — DX: Malignant (primary) neoplasm, unspecified: C80.1

## 2016-12-17 HISTORY — DX: Type 2 diabetes mellitus without complications: E11.9

## 2016-12-17 MED ORDER — IOPAMIDOL (ISOVUE-300) INJECTION 61%
100.0000 mL | Freq: Once | INTRAVENOUS | Status: AC | PRN
  Administered 2016-12-17: 100 mL via INTRAVENOUS

## 2016-12-17 MED ORDER — IOPAMIDOL (ISOVUE-300) INJECTION 61%
INTRAVENOUS | Status: AC
Start: 2016-12-17 — End: 2016-12-17
  Filled 2016-12-17: qty 100

## 2016-12-17 NOTE — Telephone Encounter (Signed)
Seen by Dr Benay Spice on 12/16/16

## 2016-12-18 ENCOUNTER — Encounter: Payer: Self-pay | Admitting: *Deleted

## 2016-12-18 ENCOUNTER — Other Ambulatory Visit: Payer: Self-pay

## 2016-12-18 ENCOUNTER — Telehealth: Payer: Self-pay

## 2016-12-18 ENCOUNTER — Ambulatory Visit (HOSPITAL_BASED_OUTPATIENT_CLINIC_OR_DEPARTMENT_OTHER): Payer: 59 | Admitting: Oncology

## 2016-12-18 ENCOUNTER — Telehealth: Payer: Self-pay | Admitting: Oncology

## 2016-12-18 VITALS — BP 168/95 | HR 117 | Temp 98.6°F | Resp 20 | Wt 163.8 lb

## 2016-12-18 DIAGNOSIS — R05 Cough: Secondary | ICD-10-CM

## 2016-12-18 DIAGNOSIS — R509 Fever, unspecified: Secondary | ICD-10-CM | POA: Diagnosis not present

## 2016-12-18 DIAGNOSIS — B181 Chronic viral hepatitis B without delta-agent: Secondary | ICD-10-CM | POA: Diagnosis not present

## 2016-12-18 DIAGNOSIS — C220 Liver cell carcinoma: Secondary | ICD-10-CM | POA: Diagnosis not present

## 2016-12-18 DIAGNOSIS — R634 Abnormal weight loss: Secondary | ICD-10-CM

## 2016-12-18 DIAGNOSIS — R630 Anorexia: Secondary | ICD-10-CM

## 2016-12-18 NOTE — Progress Notes (Signed)
[facility] OFFICE PROGRESS NOTE   Diagnosis: Hepatocellular carcinoma  INTERVAL HISTORY:   Justin [name] returns as scheduled. He is here with his family and a physician friend. The physician served as an interpreter. He reports less fever after taking naproxen yesterday.  Objective:  Vital signs in last 24 hours:  Blood pressure (!) 168/95, pulse (!) 117, temperature 98.6 F (37 C), temperature source Oral, resp. rate 20, weight 163 lb 12.8 oz (74.3 kg), SpO2 100 %.   Physical examination-not performed today   Imaging:  Ct Chest W Contrast  Result Date: 12/17/2016 CLINICAL DATA:  56 year old male with hepatitis-B and newly diagnosed large right liver lobe mass with markedly elevated AFP, compatible with hepatocellular carcinoma, presenting for staging. EXAM: CT CHEST, ABDOMEN, AND PELVIS WITH CONTRAST TECHNIQUE: Multidetector CT imaging of the chest, abdomen and pelvis was performed following the standard protocol during bolus administration of intravenous contrast. CONTRAST:  143mL ISOVUE-300 IOPAMIDOL (ISOVUE-300) INJECTION 61% COMPARISON:  12/04/2016 abdominal sonogram.  12/12/2016 MRI abdomen. FINDINGS: CT CHEST FINDINGS Cardiovascular: Normal heart size. No significant pericardial fluid/thickening. Great vessels are normal in course and caliber. No central pulmonary emboli. Mediastinum/Nodes: No discrete thyroid nodules. Unremarkable esophagus. No axillary adenopathy. Enlarged 1.5 cm right lower paraesophageal mediastinal lymph node (series 5/image 60). No additional pathologically enlarged mediastinal or hilar lymph nodes. Lungs/Pleura: No pneumothorax. No pleural effusion. Mild compressive atelectasis in the right lower lobe. Right middle lobe 7 x 5 mm (average diameter 6 mm) solid pulmonary nodule (series 7/ image 36). Posterior right lower lobe 3 mm solid pulmonary nodule (series 7/ image 28). No acute consolidative airspace disease, lung masses or additional  significant pulmonary nodules. Musculoskeletal: No aggressive appearing focal osseous lesions. Mild thoracic spondylosis. CT ABDOMEN PELVIS FINDINGS Hepatobiliary: There is a large heterogeneously enhancing irregularly marginated 13.3 x 12.4 cm posterior right liver mass (series 5/ image 65) with patchy peripheral arterial phase hyperenhancement, involving segments 7 and 8. No additional liver masses. No appreciable hepatic or portal venous tumor thrombi, noting apparent extrinsic occlusion of the right hepatic vein. Normal gallbladder with no radiopaque cholelithiasis. No biliary ductal dilatation. Pancreas: Normal, with no mass or duct dilation. Spleen: Normal size. No mass. Adrenals/Urinary Tract: No discrete adrenal nodules. No hydronephrosis. Simple 1.6 cm renal cyst in the posterior interpolar left kidney. Several subcentimeter hypodense renal cortical lesions scattered throughout both kidneys are too small to characterize and require no further follow-up. Normal bladder. Stomach/Bowel: Grossly normal stomach. Normal caliber small bowel with no small bowel wall thickening. Normal appendix. Normal large bowel with no diverticulosis, large bowel wall thickening or pericolonic fat stranding. Vascular/Lymphatic: Normal caliber abdominal aorta. Patent portal, splenic and renal veins. Left and middle hepatic veins are patent. Mildly enlarged 1.1 cm porta hepatis node (series 5/ image 95). No additional pathologically enlarged abdominopelvic nodes. Reproductive: Mildly enlarged prostate. Other: No pneumoperitoneum, ascites or focal fluid collection. Musculoskeletal: No aggressive appearing focal osseous lesions. Mild lumbar spondylosis. IMPRESSION: 1. Large irregular heterogeneously enhancing 13.3 cm posterior right liver mass involving segments 7 and 8, compatible with hepatocellular carcinoma. 2. Nonspecific porta hepatis and right lower paraesophageal mediastinal lymphadenopathy, nodal metastases not excluded. 3.  Two pulmonary nodules in the right lung, largest 6 mm in the right middle lobe, nonspecific. Recommend attention on follow-up chest CT in 3 months. 4. Mild compressive atelectasis in the right lower lobe. 5. Mildly enlarged prostate. Electronically Signed   By: Ilona Sorrel M.D.   On: 12/17/2016 09:25   Ct Abdomen Pelvis  W Contrast  Result Date: 12/17/2016 CLINICAL DATA:  56 year old male with hepatitis-B and newly diagnosed large right liver lobe mass with markedly elevated AFP, compatible with hepatocellular carcinoma, presenting for staging. EXAM: CT CHEST, ABDOMEN, AND PELVIS WITH CONTRAST TECHNIQUE: Multidetector CT imaging of the chest, abdomen and pelvis was performed following the standard protocol during bolus administration of intravenous contrast. CONTRAST:  145mL ISOVUE-300 IOPAMIDOL (ISOVUE-300) INJECTION 61% COMPARISON:  12/04/2016 abdominal sonogram.  12/12/2016 MRI abdomen. FINDINGS: CT CHEST FINDINGS Cardiovascular: Normal heart size. No significant pericardial fluid/thickening. Great vessels are normal in course and caliber. No central pulmonary emboli. Mediastinum/Nodes: No discrete thyroid nodules. Unremarkable esophagus. No axillary adenopathy. Enlarged 1.5 cm right lower paraesophageal mediastinal lymph node (series 5/image 60). No additional pathologically enlarged mediastinal or hilar lymph nodes. Lungs/Pleura: No pneumothorax. No pleural effusion. Mild compressive atelectasis in the right lower lobe. Right middle lobe 7 x 5 mm (average diameter 6 mm) solid pulmonary nodule (series 7/ image 36). Posterior right lower lobe 3 mm solid pulmonary nodule (series 7/ image 28). No acute consolidative airspace disease, lung masses or additional significant pulmonary nodules. Musculoskeletal: No aggressive appearing focal osseous lesions. Mild thoracic spondylosis. CT ABDOMEN PELVIS FINDINGS Hepatobiliary: There is a large heterogeneously enhancing irregularly marginated 13.3 x 12.4 cm posterior  right liver mass (series 5/ image 65) with patchy peripheral arterial phase hyperenhancement, involving segments 7 and 8. No additional liver masses. No appreciable hepatic or portal venous tumor thrombi, noting apparent extrinsic occlusion of the right hepatic vein. Normal gallbladder with no radiopaque cholelithiasis. No biliary ductal dilatation. Pancreas: Normal, with no mass or duct dilation. Spleen: Normal size. No mass. Adrenals/Urinary Tract: No discrete adrenal nodules. No hydronephrosis. Simple 1.6 cm renal cyst in the posterior interpolar left kidney. Several subcentimeter hypodense renal cortical lesions scattered throughout both kidneys are too small to characterize and require no further follow-up. Normal bladder. Stomach/Bowel: Grossly normal stomach. Normal caliber small bowel with no small bowel wall thickening. Normal appendix. Normal large bowel with no diverticulosis, large bowel wall thickening or pericolonic fat stranding. Vascular/Lymphatic: Normal caliber abdominal aorta. Patent portal, splenic and renal veins. Left and middle hepatic veins are patent. Mildly enlarged 1.1 cm porta hepatis node (series 5/ image 95). No additional pathologically enlarged abdominopelvic nodes. Reproductive: Mildly enlarged prostate. Other: No pneumoperitoneum, ascites or focal fluid collection. Musculoskeletal: No aggressive appearing focal osseous lesions. Mild lumbar spondylosis. IMPRESSION: 1. Large irregular heterogeneously enhancing 13.3 cm posterior right liver mass involving segments 7 and 8, compatible with hepatocellular carcinoma. 2. Nonspecific porta hepatis and right lower paraesophageal mediastinal lymphadenopathy, nodal metastases not excluded. 3. Two pulmonary nodules in the right lung, largest 6 mm in the right middle lobe, nonspecific. Recommend attention on follow-up chest CT in 3 months. 4. Mild compressive atelectasis in the right lower lobe. 5. Mildly enlarged prostate. Electronically Signed    By: Ilona Sorrel M.D.   On: 12/17/2016 09:25    Medications: I have reviewed the patient's current medications.  Assessment/Plan: 1. Hepatocellular carcinoma ? MRI abdomen 12/12/2016 consistent with a right hepatic HCC measuring 12.3 x 12.7 cm ? Markedly elevated AFP ? History of hepatitis B ? CTs chest, abdomen, and pelvis 12/17/2016-large right liver mass involving segments 7 and 8, possible involvement of the left liver. Nonspecific right lung nodules, nonspecific porta hepatis nodes, 1.5 cm right lower paraesophageal mediastinal node  2.   Chronic hepatitis B infection  3.   Fever  4.   Nonproductive cough  5.   Anorexia/weight loss  Disposition:  Justin [name] appears unchanged. He has hepatocellular carcinoma. His case was presented at the GI tumor conference earlier today. Review of the MRI and CTs is concerning for involvement of the diaphragm and left liver. Dr. Barry Dienes does not recommend an attempt at resection. The enlarged mediastinal lymph node is suspicious for involvement with tumor.  I reviewed the CT images with the patient and his family. The recommendation from the GI tumor conference is to proceed with an EUS biopsy of the mediastinal node. If this is positive I recommend hepatic directed therapy for palliation of the large right liver mass. The mass is likely causing fever and the cough. We discussed chemoembolization and radioembolization. I will refer him to interventional radiology to consider radioembolization.  If the lymph node biopsy is negative I will refer him to [facility] for a second surgical opinion.  Systemic treatment options include sorafenib and PD1 inhibitor therapy.  He will continue naproxen for prophylaxis of the fever. He will return for an office visit here in one month. I am available to see him sooner as needed.  He will follow-up with Dr. Brigitte Pulse for management of hypertension.  Approximately 25 minutes were spent with the patient  today. The majority of the time was used for counseling and coordination of care.  Betsy Coder, MD  12/18/2016  1:19 PM

## 2016-12-18 NOTE — Telephone Encounter (Signed)
-----   Message from Milus Banister, MD sent at 12/18/2016  9:57 AM EST ----- Regarding: RE: EUS Manuela Schwartz, I'm sure I can get him in, probably next week.  THanks  Patty, He needs upper EUS, radial +/- linear for liver mass, paraesophageal mass.  Next available EUS Thursday with MAC sedation.  Thanks  dj   ----- Message ----- From: Tania Ade, RN Sent: 12/18/2016   9:29 AM To: Milus Banister, MD, Ladell Pier, MD, # Subject: EUS                                            Dr. Ardis Hughs, Tumor board discussion after you left today on this case.  Radiology noted distal para-esophgeal node and EUS biopsy is suggested. Will you be able to schedule this at your first availability? Thanks, S. Michaelle Copas

## 2016-12-18 NOTE — Progress Notes (Signed)
Oncology Nurse Navigator Documentation  Oncology Nurse Navigator Flowsheets 12/18/2016  Navigator Location [facility]  Referral date to RadOnc/MedOnc -  Navigator Encounter Type Follow-up Appt  Abnormal Finding Date -  Confirmed Diagnosis Date -  Patient Visit Type MedOnc;Follow-up  Treatment Phase Abnormal Scans;Pre-Tx/Tx Discussion  Barriers/Navigation Needs Education;Coordination of Care--needs EUS bx of para-esophageal node  Education Understanding Cancer/ Treatment Options  Interventions Education;Coordination of Care  Coordination of Care EUS--message to [facility] for EUS as soon as possible--scheduled for 12/26/16  Education Method Verbal;Written--provided printed information on SIRT treatment (Y-90) and informed him of U-tube video he can watch as well.  Support Groups/Services -  [facility] -  Time Spent with Patient 30

## 2016-12-18 NOTE — Telephone Encounter (Signed)
Gave patient avs report and appointments for February. Left message for Kristeen Miss at [place] re appointment for radio embolization of liver - order in [facility] - office will contact patient. Referral for [facility] in [place] - office will call re appointment.

## 2016-12-18 NOTE — Telephone Encounter (Signed)
Patient notified of the need for EUS and consents to proceed. He is scheduled for 12/26/16 [facility] to arrive at 6;00 am.  He verbalized understanding of instructions that were reviewed with him and that I will mail them as well.  He will call back for questions once he receives them .

## 2016-12-18 NOTE — Patient Instructions (Signed)
Proceed with EUS by Dr. Ardis Hughs to determine if lymph node is metastatic cancer Refer to interventional radiology for Y-90 (radioembolization) if node is positive If node is negative, will refer to Dr. Crisoforo Oxford or Dr. Eugenia Pancoast at [facility] reserve Nivolumab for later if needed Talk with Dr. Brigitte Pulse about your BP medications Continue your naprosyn for fever

## 2016-12-19 ENCOUNTER — Encounter: Payer: Self-pay | Admitting: *Deleted

## 2016-12-19 ENCOUNTER — Ambulatory Visit
Admission: RE | Admit: 2016-12-19 | Discharge: 2016-12-19 | Disposition: A | Discharge status: Home / Self Care | Payer: 59 | Source: Ambulatory Visit | Attending: Oncology | Admitting: Oncology

## 2016-12-19 DIAGNOSIS — C220 Liver cell carcinoma: Secondary | ICD-10-CM

## 2016-12-19 HISTORY — PX: IR GENERIC HISTORICAL: IMG1180011

## 2016-12-19 NOTE — Consult Note (Signed)
Chief Complaint: Patient was seen in consultation today for  Chief Complaint  Patient presents with  . Advice Only    Consult for Embolization of Right Lobe Liver Mass   at the request of Sherrill,Gary B  Referring Physician(s): Ladell Pier  Supervising Physician: Arne Cleveland  Patient Status: [facility] Out[facility]  History of Present Illness: Justin [name] is a 56 y.o. male diagnosed with hepatitis B at the time of a physical exam in September. He saw Dr. Brigitte Pulse in December and was referred for a liver ultrasound. An irregular solid mass was noted in the posterior right liver measuring 12 x 10 x 12 cm concerning for a hepatoma. No other hepatic lesions. He was referred for an MRI of the abdomen on 12/12/2016. A large mass was confirmed in the right liver involving segments 6 and 7. The mass measures 12.3 x 12.7 cm. Imaging findings are consistent with a malignancy. No retroperitoneal or periportal adenopathy.There is a periesophageal abnormal lymph node.  He is referred for oncology evaluation. He complains of intermittent fever for the past several months. He has a cough for 6 months.  Review of the MRI and CTs is concerning for involvement of the diaphragm and left liver. Dr. Barry Dienes does not recommend an attempt at resection. The enlarged mediastinal lymph node is suspicious for involvement with tumor.  The recommendation from the GI tumor conference is to proceed with an EUS biopsy of the mediastinal node. If this is positive plan for hepatic directed therapy for palliation of the large right liver mass. The mass is likely causing fever and the cough. Possible chemoembolization and radioembolization. Referral to interventional radiology to consider radioembolization.  If the lymph node biopsy is negative then referral to [facility] for a second surgical opinion.  Past Medical History:  Diagnosis Date  . Cancer ([place])   . Chronic hepatitis B virus infection ([place]) 02/15/2004   Annotation: 02/15/2004:  +Core Ab, +SAg, -SAb, -Be Ag with elevated liver  enzymes HBV DNA via PCR:  <2.3 AFP normal Evaluated by [facility] 07/24/2004--Dr. Patsy [place] Qualifier: Diagnosis of  By: Amil Amen MD, Benjamine Mola    . Diabetes mellitus without complication (HCC)    no meds  . High cholesterol 2006  . Hyperlipidemia   . Hypertension   . Prediabetes 08/31/2016   Lab Results Component Value Date  HGBA1C 6.2 (H) 08/30/2015      Past Surgical History:  Procedure Laterality Date  . IR GENERIC HISTORICAL  12/19/2016   IR RADIOLOGIST EVAL & MGMT 12/19/2016 Arne Cleveland, MD [facility] INTERV RAD    Allergies: Patient has no known allergies.  Medications: Prior to Admission medications   Medication Sig Start Date End Date Taking? Authorizing Provider  amLODipine (NORVASC) 5 MG tablet Take 1 tablet (5 mg total) by mouth daily. 08/31/16   Gregor Hams, MD  aspirin EC 81 MG tablet Take 1 tablet (81 mg total) by mouth daily. 10/15/16   Shawnee Knapp, MD  blood glucose meter kit and supplies KIT Dispense based on patient and insurance preference. Use up to four times daily as directed. (FOR ICD-9 250.00, 250.01). 10/15/16   Shawnee Knapp, MD  lisinopril-hydrochlorothiazide (ZESTORETIC) 20-12.5 MG tablet Take 1 tablet by mouth daily. 08/31/16   Gregor Hams, MD  lovastatin (MEVACOR) 20 MG tablet Take 1 tablet (20 mg total) by mouth at bedtime. 08/31/16   Gregor Hams, MD     Family History  Problem Relation Age of Onset  .  Heart disease Father   . Hyperlipidemia Father     Social History   Social History  . Marital status: Married    Spouse name: N/A  . Number of children: N/A  . Years of education: N/A   Social History Main Topics  . Smoking status: Never Smoker  . Smokeless tobacco: Never Used  . Alcohol use No  . Drug use: No  . Sexual activity: No   Other Topics Concern  . Not on file   Social History Narrative   Married, wife Merla Riches   Employed at [employer]  in [place]   Has lived in [place] over 20 years    ECOG Status: 2 - Symptomatic, <50% confined to bed  Review of Systems: A 12 point ROS discussed and pertinent positives are indicated in the HPI above.  All other systems are negative.  Review of Systems  Vital Signs: BP (!) 151/97 (BP Location: Left Arm, Patient Position: Sitting, Cuff Size: Normal)   Pulse 99   Temp 97.9 F (36.6 C) (Oral)   Resp 14   Ht '5\' 7"'  (1.702 m)   Wt 164 lb (74.4 kg)   SpO2 99%   BMI 25.69 kg/m   Physical Exam  Mallampati Score:     Imaging: Ct Chest W Contrast  Result Date: 12/17/2016 CLINICAL DATA:  56 year old male with hepatitis-B and newly diagnosed large right liver lobe mass with markedly elevated AFP, compatible with hepatocellular carcinoma, presenting for staging. EXAM: CT CHEST, ABDOMEN, AND PELVIS WITH CONTRAST TECHNIQUE: Multidetector CT imaging of the chest, abdomen and pelvis was performed following the standard protocol during bolus administration of intravenous contrast. CONTRAST:  163m ISOVUE-300 IOPAMIDOL (ISOVUE-300) INJECTION 61% COMPARISON:  12/04/2016 abdominal sonogram.  12/12/2016 MRI abdomen. FINDINGS: CT CHEST FINDINGS Cardiovascular: Normal heart size. No significant pericardial fluid/thickening. Great vessels are normal in course and caliber. No central pulmonary emboli. Mediastinum/Nodes: No discrete thyroid nodules. Unremarkable esophagus. No axillary adenopathy. Enlarged 1.5 cm right lower paraesophageal mediastinal lymph node (series 5/image 60). No additional pathologically enlarged mediastinal or hilar lymph nodes. Lungs/Pleura: No pneumothorax. No pleural effusion. Mild compressive atelectasis in the right lower lobe. Right middle lobe 7 x 5 mm (average diameter 6 mm) solid pulmonary nodule (series 7/ image 36). Posterior right lower lobe 3 mm solid pulmonary nodule (series 7/ image 28). No acute consolidative airspace disease, lung masses or additional  significant pulmonary nodules. Musculoskeletal: No aggressive appearing focal osseous lesions. Mild thoracic spondylosis. CT ABDOMEN PELVIS FINDINGS Hepatobiliary: There is a large heterogeneously enhancing irregularly marginated 13.3 x 12.4 cm posterior right liver mass (series 5/ image 65) with patchy peripheral arterial phase hyperenhancement, involving segments 7 and 8. No additional liver masses. No appreciable hepatic or portal venous tumor thrombi, noting apparent extrinsic occlusion of the right hepatic vein. Normal gallbladder with no radiopaque cholelithiasis. No biliary ductal dilatation. Pancreas: Normal, with no mass or duct dilation. Spleen: Normal size. No mass. Adrenals/Urinary Tract: No discrete adrenal nodules. No hydronephrosis. Simple 1.6 cm renal cyst in the posterior interpolar left kidney. Several subcentimeter hypodense renal cortical lesions scattered throughout both kidneys are too small to characterize and require no further follow-up. Normal bladder. Stomach/Bowel: Grossly normal stomach. Normal caliber small bowel with no small bowel wall thickening. Normal appendix. Normal large bowel with no diverticulosis, large bowel wall thickening or pericolonic fat stranding. Vascular/Lymphatic: Normal caliber abdominal aorta. Patent portal, splenic and renal veins. Left and middle hepatic veins are patent. Mildly enlarged  1.1 cm porta hepatis node (series 5/ image 95). No additional pathologically enlarged abdominopelvic nodes. Reproductive: Mildly enlarged prostate. Other: No pneumoperitoneum, ascites or focal fluid collection. Musculoskeletal: No aggressive appearing focal osseous lesions. Mild lumbar spondylosis. IMPRESSION: 1. Large irregular heterogeneously enhancing 13.3 cm posterior right liver mass involving segments 7 and 8, compatible with hepatocellular carcinoma. 2. Nonspecific porta hepatis and right lower paraesophageal mediastinal lymphadenopathy, nodal metastases not excluded. 3.  Two pulmonary nodules in the right lung, largest 6 mm in the right middle lobe, nonspecific. Recommend attention on follow-up chest CT in 3 months. 4. Mild compressive atelectasis in the right lower lobe. 5. Mildly enlarged prostate. Electronically Signed   By: Ilona Sorrel M.D.   On: 12/17/2016 09:25   Ct Abdomen Pelvis W Contrast  Result Date: 12/17/2016 CLINICAL DATA:  56 year old male with hepatitis-B and newly diagnosed large right liver lobe mass with markedly elevated AFP, compatible with hepatocellular carcinoma, presenting for staging. EXAM: CT CHEST, ABDOMEN, AND PELVIS WITH CONTRAST TECHNIQUE: Multidetector CT imaging of the chest, abdomen and pelvis was performed following the standard protocol during bolus administration of intravenous contrast. CONTRAST:  145m ISOVUE-300 IOPAMIDOL (ISOVUE-300) INJECTION 61% COMPARISON:  12/04/2016 abdominal sonogram.  12/12/2016 MRI abdomen. FINDINGS: CT CHEST FINDINGS Cardiovascular: Normal heart size. No significant pericardial fluid/thickening. Great vessels are normal in course and caliber. No central pulmonary emboli. Mediastinum/Nodes: No discrete thyroid nodules. Unremarkable esophagus. No axillary adenopathy. Enlarged 1.5 cm right lower paraesophageal mediastinal lymph node (series 5/image 60). No additional pathologically enlarged mediastinal or hilar lymph nodes. Lungs/Pleura: No pneumothorax. No pleural effusion. Mild compressive atelectasis in the right lower lobe. Right middle lobe 7 x 5 mm (average diameter 6 mm) solid pulmonary nodule (series 7/ image 36). Posterior right lower lobe 3 mm solid pulmonary nodule (series 7/ image 28). No acute consolidative airspace disease, lung masses or additional significant pulmonary nodules. Musculoskeletal: No aggressive appearing focal osseous lesions. Mild thoracic spondylosis. CT ABDOMEN PELVIS FINDINGS Hepatobiliary: There is a large heterogeneously enhancing irregularly marginated 13.3 x 12.4 cm posterior  right liver mass (series 5/ image 65) with patchy peripheral arterial phase hyperenhancement, involving segments 7 and 8. No additional liver masses. No appreciable hepatic or portal venous tumor thrombi, noting apparent extrinsic occlusion of the right hepatic vein. Normal gallbladder with no radiopaque cholelithiasis. No biliary ductal dilatation. Pancreas: Normal, with no mass or duct dilation. Spleen: Normal size. No mass. Adrenals/Urinary Tract: No discrete adrenal nodules. No hydronephrosis. Simple 1.6 cm renal cyst in the posterior interpolar left kidney. Several subcentimeter hypodense renal cortical lesions scattered throughout both kidneys are too small to characterize and require no further follow-up. Normal bladder. Stomach/Bowel: Grossly normal stomach. Normal caliber small bowel with no small bowel wall thickening. Normal appendix. Normal large bowel with no diverticulosis, large bowel wall thickening or pericolonic fat stranding. Vascular/Lymphatic: Normal caliber abdominal aorta. Patent portal, splenic and renal veins. Left and middle hepatic veins are patent. Mildly enlarged 1.1 cm porta hepatis node (series 5/ image 95). No additional pathologically enlarged abdominopelvic nodes. Reproductive: Mildly enlarged prostate. Other: No pneumoperitoneum, ascites or focal fluid collection. Musculoskeletal: No aggressive appearing focal osseous lesions. Mild lumbar spondylosis. IMPRESSION: 1. Large irregular heterogeneously enhancing 13.3 cm posterior right liver mass involving segments 7 and 8, compatible with hepatocellular carcinoma. 2. Nonspecific porta hepatis and right lower paraesophageal mediastinal lymphadenopathy, nodal metastases not excluded. 3. Two pulmonary nodules in the right lung, largest 6 mm in the right middle lobe, nonspecific. Recommend attention on follow-up chest CT in  3 months. 4. Mild compressive atelectasis in the right lower lobe. 5. Mildly enlarged prostate. Electronically Signed    By: Ilona Sorrel M.D.   On: 12/17/2016 09:25   Mr Liver W Wo Contrast  Result Date: 12/12/2016 CLINICAL DATA:  Indeterminate lesion on hepatic ultrasound. History of hepatitis-C and hepatitis B. EXAM: MRI ABDOMEN WITHOUT AND WITH CONTRAST TECHNIQUE: Multiplanar multisequence MR imaging of the abdomen was performed both before and after the administration of intravenous contrast. CONTRAST:  80 mL Eovist COMPARISON:  Ultrasound 12/04/2016 FINDINGS: Lower chest:  Clear Hepatobiliary: Large mass lesion within the RIGHT hepatic lobe predominantly within segments 7 also extending into segments 6. Mass lesion measures 12.3 x 12.7 cm in axial dimension (image 11, series 18) and is heterogeneous high signal intensity on T2 weighted imaging. Lesion demonstrates variable restricted diffusion. On postcontrast imaging lesion demonstrates continuous peripheral enhancement and central hypoperfusion with reticulation. On the delayed 20 minutes imaging, lesion does not accumulate the hepatocytes specific radiotracer. All findings are consistent with malignancy. Portal veins are patent. No ascites.Within the RIGHT hepatic lobe (segments Pancreas: Normal pancreatic parenchymal intensity. No ductal dilatation or inflammation. Spleen: Normal spleen. Adrenals/urinary tract: Adrenal glands and kidneys are normal. Stomach/Bowel: Stomach and limited of the small bowel is unremarkable Vascular/Lymphatic: Abdominal aortic normal caliber. No retroperitoneal periportal lymphadenopathy. Musculoskeletal: No aggressive osseous lesion all IMPRESSION: 1. Large lobular enhancing mass occupying a large portion of the RIGHT hepatic lobe has imaging characteristics consistent malignancy. In patient with hepatitis-C findings are consistent with HEPATOCELLULAR CARCINOMA. 2. No lesions identified in LEFT hepatic lobe. 3. Portal veins are patent.  No ascites. 4. No lymphadenopathy. These results will be called to the ordering clinician or  representative by the Radiologist Assistant, and communication documented in the PACS or zVision Dashboard. Electronically Signed   By: Suzy Bouchard M.D.   On: 12/12/2016 08:46   Ir Radiologist Eval & Mgmt  Result Date: 12/19/2016 Please refer to "Notes" to see consult details.  US Abdomen Limited Ruq  Result Date: 12/04/2016 CLINICAL DATA:  Chronic hepatitis-B EXAM: US ABDOMEN LIMITED - RIGHT UPPER QUADRANT COMPARISON:  None. FINDINGS: Gallbladder: The gallbladder is visualized and no gallstones are noted. There is no pain over the gallbladder with compression. Common bile duct: Diameter: The common bile duct is normal measuring 5.2 mm in diameter. Liver: There is and irregular hypoechoic inhomogeneous solid mass within the posterior right lobe of liver of 12 x 10 x 12 cm. Some blood flow is noted within this lesion. MRI of the liver is recommended to assess for hepatoma. No other hepatic abnormality is seen. IMPRESSION: 1. Solid inhomogeneous mass within the posterior right lobe of liver of 12 x 10 x 12 cm, worrisome for hepatoma. Recommend MRI of the liver to assess further. No other hepatic lesion is seen. 2. No gallstones. Electronically Signed   By: Ivar Drape M.D.   On: 12/04/2016 10:45    Labs:  CBC:  Recent Labs  08/31/16 0840 12/07/16 0854  WBC 8.1 12.1*  HGB 13.2 12.7*  HCT 39.6 36.4*  PLT 449*  --     COAGS: No results for input(s): INR, APTT in the last 8760 hours.  BMP:  Recent Labs  08/31/16 0840 12/07/16 0838  NA 139 139  K 4.0 3.5  CL 99 97  CO2 29 24  GLUCOSE 156* 221*  BUN 12 9  CALCIUM 9.1 9.3  CREATININE 1.00 0.89  GFRNONAA  --  95  GFRAA  --  110  LIVER FUNCTION TESTS:  Recent Labs  08/31/16 0840 12/07/16 0838  BILITOT 0.6 0.8  AST 16 24  ALT 17 19  ALKPHOS 73 86  PROT 7.3 7.6  ALBUMIN 3.7 3.5    TUMOR MARKERS:  Recent Labs  12/07/16 1000  AFPTM 58,664.0*    Assessment and Plan:  My impression is that this patient's  large predominantly right lobe hepatocellular carcinoma would be approachable for percutaneous transarterial palliative treatment. I would start with Y 90 radio embolization due to lower systemic toxicity compared to Deb TACE. This can be performed concurrently with any chemotherapy regimen. I discussed with the patient and his entourage the treatment options including Y90 radio embolization. We discussed in detail the anatomic approach for Y 90 radio embolization, need for preprocedure mapping with possible embolization and test bolus to evaluate shunt. We reviewed the details of the actual embolization procedure. I reviewed the likelihood of postprocedure fatigue and regional tenderness. We discussed possible side effects and complications including hepatic insufficiency and nontarget  radio embolization ulceration. We discussed the timing and ability for up to 3 total dose treatments depending on dosing to background liver and tumor response. I emphasize that only surgical resection is curative, and this represents a palliative procedure. I think it would be important to proceed to prevent further tumor encroachment on hepatic inflow or outflow vessels and concomitant hepatic failure. He and his entourage seemed to understand and did ask appropriate questions.   As per Dr. Gearldine Shown plan, we will first wait for results of endoscopic biopsy, scheduled for next Thursday. If positive, will proceed with radio embolization mapping and treatment. If negative, will wait for results of surgical consultation regarding need for preprocedure radio embolization.  Thank you for this interesting consult.  I greatly enjoyed meeting Tramel Descoteaux and look forward to participating in their care.  A copy of this report was sent to the requesting provider on this date.  Electronically Signed: Steffany Schoenfelder III, DAYNE Symphonie Schneiderman 12/19/2016, 10:37 AM   I spent a total of  40 Minutes   in face to face in clinical consultation,  greater than 50% of which was counseling/coordinating care for hepatocellular carcinoma.

## 2016-12-23 ENCOUNTER — Encounter: Payer: Self-pay | Admitting: Family Medicine

## 2016-12-23 ENCOUNTER — Ambulatory Visit (INDEPENDENT_AMBULATORY_CARE_PROVIDER_SITE_OTHER): Payer: 59 | Admitting: Family Medicine

## 2016-12-23 VITALS — BP 144/98 | HR 108 | Temp 98.9°F | Resp 16 | Ht 67.0 in | Wt 161.8 lb

## 2016-12-23 DIAGNOSIS — D649 Anemia, unspecified: Secondary | ICD-10-CM

## 2016-12-23 DIAGNOSIS — E119 Type 2 diabetes mellitus without complications: Secondary | ICD-10-CM | POA: Diagnosis not present

## 2016-12-23 DIAGNOSIS — C220 Liver cell carcinoma: Secondary | ICD-10-CM | POA: Diagnosis not present

## 2016-12-23 DIAGNOSIS — I10 Essential (primary) hypertension: Secondary | ICD-10-CM

## 2016-12-23 DIAGNOSIS — E785 Hyperlipidemia, unspecified: Secondary | ICD-10-CM | POA: Diagnosis not present

## 2016-12-23 MED ORDER — LOSARTAN POTASSIUM 100 MG PO TABS: 100.0000 mg | ORAL_TABLET | Freq: Every day | ORAL | 3 refills | Status: DC

## 2016-12-23 NOTE — Patient Instructions (Addendum)
Stop your lisinopril HCTZ and your lovastatin. Start losartan.  Your cholesterol last September was excellent so I think it is reasonable to just leave you off of a statin medication while you're liver is likely going to be poked him prodded and busy fighting off this cancer. We can likely restart the statin medication in 6 months without any long-term health problems from being off this.   If you develop more swelling in your legs we can restart the HCTZ portion of the new low certain blood pressure medication. If you still have the cough and shortness of breath on the low certain it may still be a problem and so we may want to try you off of it for several weeks just so we can be sure that the medication is the only cause of your symptoms. If your blood pressure continues to be elevated we can double her amlodipine from 5-10 mg. The most common side effect from this is swelling in your legs in which case please let me know and we would drop it back down. Plan to recheck with me in 2-3 weeks to see if the new losartan 100 and amlodipine 5 mg is adequate to control your blood pressure and make additional changes if not. If you have had several opportunities to have your blood pressure checked in the next 2-3 weeks, please cancel the appointment and call me as week and likely make any necessary changes by phone call as long as we have some blood pressure readings.  Your hemoglobin a1c (an average of all of your blood sugars over the past 3 months) is 7.1and 6.5 or >  is diabetes.Try to increase exercise and decrease carbohydrates in your diet - especially trying to eliminate white starches such as potatoes (sweet potatoes are ok), rice, bread, and pasta.  Eat less sugar.  Use whole grain or wheat products whenever available and try to reduce all carbohydrates to <25% of your diet.  Whenever you eat fruit, make sure it is the whole fruit (like in a smoothie) so you get fiber of the fruit (unlike in a juice where  you just get the sugar of the fruit). When you eat fruit, try to choose more low glycemic index fruits (cherries, grapefruit, apricots, pears, apples, oranges, plums, strawberries, peaches, and grapes are some of the better ones for blood sugar.)     Why follow it? Research shows. . Those who follow the Mediterranean diet have a reduced risk of heart disease  . The diet is associated with a reduced incidence of Parkinson's and Alzheimer's diseases . People following the diet may have longer life expectancies and lower rates of chronic diseases  . The Dietary Guidelines for Americans recommends the Mediterranean diet as an eating plan to promote health and prevent disease  What Is the Mediterranean Diet?  . Healthy eating plan based on typical foods and recipes of Mediterranean-style cooking . The diet is primarily a plant based diet; these foods should make up a majority of meals   Starches - Plant based foods should make up a majority of meals - They are an important sources of vitamins, minerals, energy, antioxidants, and fiber - Choose whole grains, foods high in fiber and minimally processed items  - Typical grain sources include wheat, oats, barley, corn, brown rice, bulgar, farro, millet, polenta, couscous  - Various types of beans include chickpeas, lentils, fava beans, black beans, white beans   Fruits  Veggies - Large quantities of antioxidant rich fruits &  veggies; 6 or more servings  - Vegetables can be eaten raw or lightly drizzled with oil and cooked  - Vegetables common to the traditional Mediterranean Diet include: artichokes, arugula, beets, broccoli, brussel sprouts, cabbage, carrots, celery, collard greens, cucumbers, eggplant, kale, leeks, lemons, lettuce, mushrooms, okra, onions, peas, peppers, potatoes, pumpkin, radishes, rutabaga, shallots, spinach, sweet potatoes, turnips, zucchini - Fruits common to the Mediterranean Diet include: apples, apricots, avocados, cherries,  clementines, dates, figs, grapefruits, grapes, melons, nectarines, oranges, peaches, pears, pomegranates, strawberries, tangerines  Fats - Replace butter and margarine with healthy oils, such as olive oil, canola oil, and tahini  - Limit nuts to no more than a handful a day  - Nuts include walnuts, almonds, pecans, pistachios, pine nuts  - Limit or avoid candied, honey roasted or heavily salted nuts - Olives are central to the [employer] - can be eaten whole or used in a variety of dishes   Meats Protein - Limiting red meat: no more than a few times a month - When eating red meat: choose lean cuts and keep the portion to the size of deck of cards - Eggs: approx. 0 to 4 times a week  - Fish and lean poultry: at least 2 a week  - Healthy protein sources include, chicken, [place], lean beef, lamb - Increase intake of seafood such as tuna, salmon, trout, mackerel, shrimp, scallops - Avoid or limit high fat processed meats such as sausage and bacon  Dairy - Include moderate amounts of low fat dairy products  - Focus on healthy dairy such as fat free yogurt, skim milk, low or reduced fat cheese - Limit dairy products higher in fat such as whole or 2% milk, cheese, ice cream  Alcohol - Moderate amounts of red wine is ok  - No more than 5 oz daily for women (all ages) and men older than age 14  - No more than 10 oz of wine daily for men younger than 62  Other - Limit sweets and other desserts  - Use herbs and spices instead of salt to flavor foods  - Herbs and spices common to the traditional Mediterranean Diet include: basil, bay leaves, chives, cloves, cumin, fennel, garlic, lavender, marjoram, mint, oregano, parsley, pepper, rosemary, sage, savory, sumac, tarragon, thyme   It's not just a diet, it's a lifestyle:  . The Mediterranean diet includes lifestyle factors typical of those in the region  . Foods, drinks and meals are best eaten with others and savored . Daily physical activity is  important for overall good health . This could be strenuous exercise like running and aerobics . This could also be more leisurely activities such as walking, housework, yard-work, or taking the stairs . Moderation is the key; a balanced and healthy diet accommodates most foods and drinks . Consider portion sizes and frequency of consumption of certain foods   Meal Ideas & Options:  . Breakfast:  o Whole wheat toast or whole wheat English muffins with peanut butter & hard boiled egg o Steel cut oats topped with apples & cinnamon and skim milk  o Fresh fruit: banana, strawberries, melon, berries, peaches  o Smoothies: strawberries, bananas, greek yogurt, peanut butter o Low fat greek yogurt with blueberries and granola  o Egg white omelet with spinach and mushrooms o Breakfast couscous: whole wheat couscous, apricots, skim milk, cranberries  . Sandwiches:  o Hummus and grilled vegetables (peppers, zucchini, squash) on whole wheat bread   o Grilled chicken on whole wheat  pita with lettuce, tomatoes, cucumbers or tzatziki  o Tuna salad on whole wheat bread: tuna salad made with greek yogurt, olives, red peppers, capers, green onions o Garlic rosemary lamb pita: lamb sauted with garlic, rosemary, salt & pepper; add lettuce, cucumber, greek yogurt to pita - flavor with lemon juice and black pepper  . Seafood:  o Mediterranean grilled salmon, seasoned with garlic, basil, parsley, lemon juice and black pepper o Shrimp, lemon, and spinach whole-grain pasta salad made with low fat greek yogurt  o Seared scallops with lemon orzo  o Seared tuna steaks seasoned salt, pepper, coriander topped with tomato mixture of olives, tomatoes, olive oil, minced garlic, parsley, green onions and cappers  . Meats:  o Herbed greek chicken salad with kalamata olives, cucumber, feta  o Red bell peppers stuffed with spinach, bulgur, lean ground beef (or lentils) & topped with feta   o Kebabs: skewers of chicken,  tomatoes, onions, zucchini, squash  o [place] burgers: made with red onions, mint, dill, lemon juice, feta cheese topped with roasted red peppers . Vegetarian o Cucumber salad: cucumbers, artichoke hearts, celery, red onion, feta cheese, tossed in olive oil & lemon juice  o Hummus and whole grain pita points with a greek salad (lettuce, tomato, feta, olives, cucumbers, red onion) o Lentil soup with celery, carrots made with vegetable broth, garlic, salt and pepper  o Tabouli salad: parsley, bulgur, mint, scallions, cucumbers, tomato, radishes, lemon juice, olive oil, salt and pepper.

## 2016-12-24 ENCOUNTER — Encounter (HOSPITAL_COMMUNITY): Payer: Self-pay | Admitting: *Deleted

## 2016-12-24 LAB — COMPREHENSIVE METABOLIC PANEL
ALBUMIN: 3.7 g/dL (ref 3.5–5.5)
ALK PHOS: 104 IU/L (ref 39–117)
ALT: 19 IU/L (ref 0–44)
AST: 24 IU/L (ref 0–40)
Albumin/Globulin Ratio: 0.8 — ABNORMAL LOW (ref 1.2–2.2)
BILIRUBIN TOTAL: 0.4 mg/dL (ref 0.0–1.2)
BUN / CREAT RATIO: 11 (ref 9–20)
BUN: 9 mg/dL (ref 6–24)
CHLORIDE: 95 mmol/L — AB (ref 96–106)
CO2: 27 mmol/L (ref 18–29)
Calcium: 9.8 mg/dL (ref 8.7–10.2)
Creatinine, Ser: 0.83 mg/dL (ref 0.76–1.27)
GFR calc non Af Amer: 97 mL/min/{1.73_m2} (ref >59)
GFR, EST AFRICAN AMERICAN: 112 mL/min/{1.73_m2} (ref >59)
GLOBULIN, TOTAL: 4.4 g/dL (ref 1.5–4.5)
Glucose: 114 mg/dL — ABNORMAL HIGH (ref 65–99)
Potassium: 3.8 mmol/L (ref 3.5–5.2)
SODIUM: 139 mmol/L (ref 134–144)
TOTAL PROTEIN: 8.1 g/dL (ref 6.0–8.5)

## 2016-12-24 LAB — CBC WITH DIFFERENTIAL/PLATELET
BASOS: 0 %
Basophils Absolute: 0 10*3/uL (ref 0.0–0.2)
EOS (ABSOLUTE): 0.2 10*3/uL (ref 0.0–0.4)
EOS: 3 %
HEMATOCRIT: 40.1 % (ref 37.5–51.0)
HEMOGLOBIN: 13.1 g/dL (ref 13.0–17.7)
Immature Grans (Abs): 0 10*3/uL (ref 0.0–0.1)
Immature Granulocytes: 0 %
LYMPHS ABS: 2.4 10*3/uL (ref 0.7–3.1)
Lymphs: 31 %
MCH: 24.9 pg — AB (ref 26.6–33.0)
MCHC: 32.7 g/dL (ref 31.5–35.7)
MCV: 76 fL — AB (ref 79–97)
MONOCYTES: 9 %
Monocytes Absolute: 0.7 10*3/uL (ref 0.1–0.9)
NEUTROS ABS: 4.5 10*3/uL (ref 1.4–7.0)
Neutrophils: 57 %
Platelets: 406 10*3/uL — ABNORMAL HIGH (ref 150–379)
RBC: 5.26 x10E6/uL (ref 4.14–5.80)
RDW: 14.3 % (ref 12.3–15.4)
WBC: 7.9 10*3/uL (ref 3.4–10.8)

## 2016-12-24 LAB — IRON: Iron: 27 ug/dL — ABNORMAL LOW (ref 38–169)

## 2016-12-24 LAB — FERRITIN: FERRITIN: 614 ng/mL — AB (ref 30–400)

## 2016-12-26 ENCOUNTER — Other Ambulatory Visit: Payer: Self-pay | Admitting: Interventional Radiology

## 2016-12-26 ENCOUNTER — Other Ambulatory Visit (HOSPITAL_COMMUNITY): Payer: Self-pay | Admitting: Interventional Radiology

## 2016-12-26 ENCOUNTER — Ambulatory Visit (HOSPITAL_COMMUNITY): Payer: 59 | Admitting: Anesthesiology

## 2016-12-26 ENCOUNTER — Encounter (HOSPITAL_COMMUNITY): Payer: Self-pay | Admitting: Gastroenterology

## 2016-12-26 ENCOUNTER — Ambulatory Visit (HOSPITAL_COMMUNITY)
Admission: RE | Admit: 2016-12-26 | Discharge: 2016-12-26 | Disposition: A | Discharge status: Home / Self Care | Payer: 59 | Source: Ambulatory Visit | Attending: Gastroenterology | Admitting: Gastroenterology

## 2016-12-26 ENCOUNTER — Encounter (HOSPITAL_COMMUNITY)
Admission: RE | Disposition: A | Discharge status: Home / Self Care | Payer: Self-pay | Source: Ambulatory Visit | Attending: Gastroenterology

## 2016-12-26 DIAGNOSIS — B1910 Unspecified viral hepatitis B without hepatic coma: Secondary | ICD-10-CM | POA: Insufficient documentation

## 2016-12-26 DIAGNOSIS — E119 Type 2 diabetes mellitus without complications: Secondary | ICD-10-CM | POA: Diagnosis not present

## 2016-12-26 DIAGNOSIS — Z79899 Other long term (current) drug therapy: Secondary | ICD-10-CM | POA: Diagnosis not present

## 2016-12-26 DIAGNOSIS — I10 Essential (primary) hypertension: Secondary | ICD-10-CM | POA: Diagnosis not present

## 2016-12-26 DIAGNOSIS — R590 Localized enlarged lymph nodes: Secondary | ICD-10-CM | POA: Diagnosis present

## 2016-12-26 DIAGNOSIS — Z7982 Long term (current) use of aspirin: Secondary | ICD-10-CM | POA: Diagnosis not present

## 2016-12-26 DIAGNOSIS — C771 Secondary and unspecified malignant neoplasm of intrathoracic lymph nodes: Secondary | ICD-10-CM | POA: Diagnosis not present

## 2016-12-26 DIAGNOSIS — C220 Liver cell carcinoma: Secondary | ICD-10-CM

## 2016-12-26 HISTORY — PX: FINE NEEDLE ASPIRATION: SHX5430

## 2016-12-26 HISTORY — PX: EUS: SHX5427

## 2016-12-26 LAB — GLUCOSE, CAPILLARY: GLUCOSE-CAPILLARY: 155 mg/dL — AB (ref 65–99)

## 2016-12-26 SURGERY — UPPER ENDOSCOPIC ULTRASOUND (EUS) LINEAR
Anesthesia: Monitor Anesthesia Care

## 2016-12-26 MED ORDER — LACTATED RINGERS IV SOLN
INTRAVENOUS | Status: DC
  Administered 2016-12-26: 09:00:00 via INTRAVENOUS

## 2016-12-26 MED ORDER — PROPOFOL 10 MG/ML IV BOLUS
INTRAVENOUS | Status: AC
  Filled 2016-12-26: qty 60

## 2016-12-26 MED ORDER — ONDANSETRON HCL 4 MG/2ML IJ SOLN
INTRAMUSCULAR | Status: AC
  Filled 2016-12-26: qty 2

## 2016-12-26 MED ORDER — PROPOFOL 10 MG/ML IV BOLUS
INTRAVENOUS | Status: DC | PRN
  Administered 2016-12-26: 30 mg via INTRAVENOUS
  Administered 2016-12-26: 20 mg via INTRAVENOUS

## 2016-12-26 MED ORDER — SODIUM CHLORIDE 0.9 % IV SOLN: INTRAVENOUS | Status: DC

## 2016-12-26 MED ORDER — LIDOCAINE 2% (20 MG/ML) 5 ML SYRINGE
INTRAMUSCULAR | Status: DC | PRN
  Administered 2016-12-26: 60 mg via INTRAVENOUS

## 2016-12-26 MED ORDER — ONDANSETRON HCL 4 MG/2ML IJ SOLN
INTRAMUSCULAR | Status: DC | PRN
  Administered 2016-12-26: 4 mg via INTRAVENOUS

## 2016-12-26 MED ORDER — PROPOFOL 500 MG/50ML IV EMUL
INTRAVENOUS | Status: DC | PRN
  Administered 2016-12-26: 150 ug/kg/min via INTRAVENOUS

## 2016-12-26 MED ORDER — LIDOCAINE 2% (20 MG/ML) 5 ML SYRINGE
INTRAMUSCULAR | Status: AC
  Filled 2016-12-26: qty 5

## 2016-12-26 NOTE — Progress Notes (Signed)
Pt had right hand 20 guage PIV placed on admission. PIV removed at discharge 12/26/16 @ 1006. Site was clean dry and intact. Brt, rn

## 2016-12-26 NOTE — H&P (View-Only) (Signed)
[place] OFFICE PROGRESS NOTE   Diagnosis: Hepatocellular carcinoma  INTERVAL HISTORY:   Justin [name] returns as scheduled. He is here with his family and a physician friend. The physician served as an interpreter. He reports less fever after taking naproxen yesterday.  Objective:  Vital signs in last 24 hours:  Blood pressure (!) 168/95, pulse (!) 117, temperature 98.6 F (37 C), temperature source Oral, resp. rate 20, weight 163 lb 12.8 oz (74.3 kg), SpO2 100 %.   Physical examination-not performed today   Imaging:  Ct Chest W Contrast  Result Date: 12/17/2016 CLINICAL DATA:  55 year old male with hepatitis-B and newly diagnosed large right liver lobe mass with markedly elevated AFP, compatible with hepatocellular carcinoma, presenting for staging. EXAM: CT CHEST, ABDOMEN, AND PELVIS WITH CONTRAST TECHNIQUE: Multidetector CT imaging of the chest, abdomen and pelvis was performed following the standard protocol during bolus administration of intravenous contrast. CONTRAST:  127mL ISOVUE-300 IOPAMIDOL (ISOVUE-300) INJECTION 61% COMPARISON:  12/04/2016 abdominal sonogram.  12/12/2016 MRI abdomen. FINDINGS: CT CHEST FINDINGS Cardiovascular: Normal heart size. No significant pericardial fluid/thickening. Great vessels are normal in course and caliber. No central pulmonary emboli. Mediastinum/Nodes: No discrete thyroid nodules. Unremarkable esophagus. No axillary adenopathy. Enlarged 1.5 cm right lower paraesophageal mediastinal lymph node (series 5/image 60). No additional pathologically enlarged mediastinal or hilar lymph nodes. Lungs/Pleura: No pneumothorax. No pleural effusion. Mild compressive atelectasis in the right lower lobe. Right middle lobe 7 x 5 mm (average diameter 6 mm) solid pulmonary nodule (series 7/ image 36). Posterior right lower lobe 3 mm solid pulmonary nodule (series 7/ image 28). No acute consolidative airspace disease, lung masses or additional  significant pulmonary nodules. Musculoskeletal: No aggressive appearing focal osseous lesions. Mild thoracic spondylosis. CT ABDOMEN PELVIS FINDINGS Hepatobiliary: There is a large heterogeneously enhancing irregularly marginated 13.3 x 12.4 cm posterior right liver mass (series 5/ image 65) with patchy peripheral arterial phase hyperenhancement, involving segments 7 and 8. No additional liver masses. No appreciable hepatic or portal venous tumor thrombi, noting apparent extrinsic occlusion of the right hepatic vein. Normal gallbladder with no radiopaque cholelithiasis. No biliary ductal dilatation. Pancreas: Normal, with no mass or duct dilation. Spleen: Normal size. No mass. Adrenals/Urinary Tract: No discrete adrenal nodules. No hydronephrosis. Simple 1.6 cm renal cyst in the posterior interpolar left kidney. Several subcentimeter hypodense renal cortical lesions scattered throughout both kidneys are too small to characterize and require no further follow-up. Normal bladder. Stomach/Bowel: Grossly normal stomach. Normal caliber small bowel with no small bowel wall thickening. Normal appendix. Normal large bowel with no diverticulosis, large bowel wall thickening or pericolonic fat stranding. Vascular/Lymphatic: Normal caliber abdominal aorta. Patent portal, splenic and renal veins. Left and middle hepatic veins are patent. Mildly enlarged 1.1 cm porta hepatis node (series 5/ image 95). No additional pathologically enlarged abdominopelvic nodes. Reproductive: Mildly enlarged prostate. Other: No pneumoperitoneum, ascites or focal fluid collection. Musculoskeletal: No aggressive appearing focal osseous lesions. Mild lumbar spondylosis. IMPRESSION: 1. Large irregular heterogeneously enhancing 13.3 cm posterior right liver mass involving segments 7 and 8, compatible with hepatocellular carcinoma. 2. Nonspecific porta hepatis and right lower paraesophageal mediastinal lymphadenopathy, nodal metastases not excluded. 3.  Two pulmonary nodules in the right lung, largest 6 mm in the right middle lobe, nonspecific. Recommend attention on follow-up chest CT in 3 months. 4. Mild compressive atelectasis in the right lower lobe. 5. Mildly enlarged prostate. Electronically Signed   By: Ilona Sorrel M.D.   On: 12/17/2016 09:25   Ct Abdomen Pelvis  W Contrast  Result Date: 12/17/2016 CLINICAL DATA:  56 year old male with hepatitis-B and newly diagnosed large right liver lobe mass with markedly elevated AFP, compatible with hepatocellular carcinoma, presenting for staging. EXAM: CT CHEST, ABDOMEN, AND PELVIS WITH CONTRAST TECHNIQUE: Multidetector CT imaging of the chest, abdomen and pelvis was performed following the standard protocol during bolus administration of intravenous contrast. CONTRAST:  18mL ISOVUE-300 IOPAMIDOL (ISOVUE-300) INJECTION 61% COMPARISON:  12/04/2016 abdominal sonogram.  12/12/2016 MRI abdomen. FINDINGS: CT CHEST FINDINGS Cardiovascular: Normal heart size. No significant pericardial fluid/thickening. Great vessels are normal in course and caliber. No central pulmonary emboli. Mediastinum/Nodes: No discrete thyroid nodules. Unremarkable esophagus. No axillary adenopathy. Enlarged 1.5 cm right lower paraesophageal mediastinal lymph node (series 5/image 60). No additional pathologically enlarged mediastinal or hilar lymph nodes. Lungs/Pleura: No pneumothorax. No pleural effusion. Mild compressive atelectasis in the right lower lobe. Right middle lobe 7 x 5 mm (average diameter 6 mm) solid pulmonary nodule (series 7/ image 36). Posterior right lower lobe 3 mm solid pulmonary nodule (series 7/ image 28). No acute consolidative airspace disease, lung masses or additional significant pulmonary nodules. Musculoskeletal: No aggressive appearing focal osseous lesions. Mild thoracic spondylosis. CT ABDOMEN PELVIS FINDINGS Hepatobiliary: There is a large heterogeneously enhancing irregularly marginated 13.3 x 12.4 cm posterior  right liver mass (series 5/ image 65) with patchy peripheral arterial phase hyperenhancement, involving segments 7 and 8. No additional liver masses. No appreciable hepatic or portal venous tumor thrombi, noting apparent extrinsic occlusion of the right hepatic vein. Normal gallbladder with no radiopaque cholelithiasis. No biliary ductal dilatation. Pancreas: Normal, with no mass or duct dilation. Spleen: Normal size. No mass. Adrenals/Urinary Tract: No discrete adrenal nodules. No hydronephrosis. Simple 1.6 cm renal cyst in the posterior interpolar left kidney. Several subcentimeter hypodense renal cortical lesions scattered throughout both kidneys are too small to characterize and require no further follow-up. Normal bladder. Stomach/Bowel: Grossly normal stomach. Normal caliber small bowel with no small bowel wall thickening. Normal appendix. Normal large bowel with no diverticulosis, large bowel wall thickening or pericolonic fat stranding. Vascular/Lymphatic: Normal caliber abdominal aorta. Patent portal, splenic and renal veins. Left and middle hepatic veins are patent. Mildly enlarged 1.1 cm porta hepatis node (series 5/ image 95). No additional pathologically enlarged abdominopelvic nodes. Reproductive: Mildly enlarged prostate. Other: No pneumoperitoneum, ascites or focal fluid collection. Musculoskeletal: No aggressive appearing focal osseous lesions. Mild lumbar spondylosis. IMPRESSION: 1. Large irregular heterogeneously enhancing 13.3 cm posterior right liver mass involving segments 7 and 8, compatible with hepatocellular carcinoma. 2. Nonspecific porta hepatis and right lower paraesophageal mediastinal lymphadenopathy, nodal metastases not excluded. 3. Two pulmonary nodules in the right lung, largest 6 mm in the right middle lobe, nonspecific. Recommend attention on follow-up chest CT in 3 months. 4. Mild compressive atelectasis in the right lower lobe. 5. Mildly enlarged prostate. Electronically Signed    By: Ilona Sorrel M.D.   On: 12/17/2016 09:25    Medications: I have reviewed the patient's current medications.  Assessment/Plan: 1. Hepatocellular carcinoma ? MRI abdomen 12/12/2016 consistent with a right hepatic HCC measuring 12.3 x 12.7 cm ? Markedly elevated AFP ? History of hepatitis B ? CTs chest, abdomen, and pelvis 12/17/2016-large right liver mass involving segments 7 and 8, possible involvement of the left liver. Nonspecific right lung nodules, nonspecific porta hepatis nodes, 1.5 cm right lower paraesophageal mediastinal node  2.   Chronic hepatitis B infection  3.   Fever  4.   Nonproductive cough  5.   Anorexia/weight loss  Disposition:  Justin [name] appears unchanged. He has hepatocellular carcinoma. His case was presented at the GI tumor conference earlier today. Review of the MRI and CTs is concerning for involvement of the diaphragm and left liver. Dr. Barry Dienes does not recommend an attempt at resection. The enlarged mediastinal lymph node is suspicious for involvement with tumor.  I reviewed the CT images with the patient and his family. The recommendation from the GI tumor conference is to proceed with an EUS biopsy of the mediastinal node. If this is positive I recommend hepatic directed therapy for palliation of the large right liver mass. The mass is likely causing fever and the cough. We discussed chemoembolization and radioembolization. I will refer him to interventional radiology to consider radioembolization.  If the lymph node biopsy is negative I will refer him to [facility] for a second surgical opinion.  Systemic treatment options include sorafenib and PD1 inhibitor therapy.  He will continue naproxen for prophylaxis of the fever. He will return for an office visit here in one month. I am available to see him sooner as needed.  He will follow-up with Dr. Brigitte Pulse for management of hypertension.  Approximately 25 minutes were spent with the patient  today. The majority of the time was used for counseling and coordination of care.  Betsy Coder, MD  12/18/2016  1:19 PM

## 2016-12-26 NOTE — Op Note (Signed)
[facility] Patient Name: Justin [name] Procedure Date: 12/26/2016 MRN: [id number] Attending MD: Milus Banister , MD Date of Birth: 1961/01/22 CSN: [id number] Age: 56 Admit Type: Outpatient Procedure:                Upper EUS Indications:              Adenopathy seen on chest CT (lower paraesophageal                            adenopathy in setting of large recently diagnosed                            hepatoma) Providers:                Milus Banister, MD, Jobe Igo, RN, Alfonso Patten, Technician, Marla Roe, CRNA Referring MD:             Julieanne Manson, MD Medicines:                Monitored Anesthesia Care Complications:            No immediate complications. Estimated blood loss:                            None. Estimated Blood Loss:     Estimated blood loss: none. Procedure:                Pre-Anesthesia Assessment:                           - Prior to the procedure, a History and Physical                            was performed, and patient medications and                            allergies were reviewed. The patient's tolerance of                            previous anesthesia was also reviewed. The risks                            and benefits of the procedure and the sedation                            options and risks were discussed with the patient.                            All questions were answered, and informed consent                            was obtained. Prior Anticoagulants: The patient has  taken no previous anticoagulant or antiplatelet                            agents. ASA Grade Assessment: II - A patient with                            mild systemic disease. After reviewing the risks                            and benefits, the patient was deemed in                            satisfactory condition to undergo the procedure.                           After obtaining informed  consent, the endoscope was                            passed under direct vision. Throughout the                            procedure, the patient's blood pressure, pulse, and                            oxygen saturations were monitored continuously. The                            [id number]) scope was introduced through                            the mouth, and advanced to the body of the stomach.                            The upper EUS was accomplished without difficulty.                            The patient tolerated the procedure well. Scope In: Scope Out: Findings:      Endoscopic Finding :      The examined esophagus was endoscopically normal.      The entire examined stomach was endoscopically normal.      The examined duodenum was endoscopically normal.      Endosonographic Finding :      1. One malignant-appearing lymph node was visualized in the lower       paraesophageal mediastinum (level 8L). It measured 22 mm in maximal       cross-sectional diameter. The node was round, hypoechoic and had well       defined margins. Fine needle aspiration for cytology was performed.       Color Doppler imaging was utilized prior to needle puncture to confirm a       lack of significant vascular structures within the needle path. Two       passes were made with the 25 gauge needle using a transesophageal       approach. A stylet was used. A cytotechnologist was present to evaluate  the adequacy of the specimen. Final cytology results are pending. Impression:               - Lower paraesophageal adenopathy in setting of                            known recently diagnosed hepatoma.                           - Preliminary cytology review is positive for                            maligancy (carcinoma, likely metastatic hepatoma). Moderate Sedation:      N/A- Per Anesthesia Care Recommendation:           - Discharge patient to home (ambulatory). Procedure Code(s):        ---  Professional ---                           [phone], 74, Esophagogastroduodenoscopy, flexible,                            transoral; with transendoscopic ultrasound-guided                            intramural or transmural fine needle                            aspiration/biopsy(s), (includes endoscopic                            ultrasound examination limited to the esophagus,                            stomach or duodenum, and adjacent structures) Diagnosis Code(s):        --- Professional ---                           I89.9, Noninfective disorder of lymphatic vessels                            and lymph nodes, unspecified                           R59.1, Generalized enlarged lymph nodes CPT copyright 2016 [employer]. All rights reserved. The codes documented in this report are preliminary and upon coder review may  be revised to meet current compliance requirements. Milus Banister, MD 12/26/2016 9:47:36 AM This report has been signed electronically. Number of Addenda: 0

## 2016-12-26 NOTE — Discharge Instructions (Signed)

## 2016-12-26 NOTE — Transfer of Care (Signed)
Immediate Anesthesia Transfer of Care Note  Patient: Justin [name]  Procedure(s) Performed: Procedure(s): UPPER ENDOSCOPIC ULTRASOUND (EUS) LINEAR (N/A) FINE NEEDLE ASPIRATION (FNA) LINEAR (N/A)  Patient Location: PACU  Anesthesia Type:MAC  Level of Consciousness:  sedated, patient cooperative and responds to stimulation  Airway & Oxygen Therapy:Patient Spontanous Breathing and Patient connected to face mask oxgen  Post-op Assessment:  Report given to PACU RN and Post -op Vital signs reviewed and stable  Post vital signs:  Reviewed and stable  Last Vitals:  Vitals:   12/26/16 0849  BP: (!) 147/107  Pulse: (!) 112  Temp: 95.0 C    Complications: No apparent anesthesia complications

## 2016-12-26 NOTE — Interval H&P Note (Signed)
History and Physical Interval Note:  12/26/2016 8:35 AM  Justin [name]  has presented today for surgery, with the diagnosis of paraesophageal mass liver mass  The various methods of treatment have been discussed with the patient and family. After consideration of risks, benefits and other options for treatment, the patient has consented to  Procedure(s): UPPER ENDOSCOPIC ULTRASOUND (EUS) LINEAR (N/A) as a surgical intervention .  The patient's history has been reviewed, patient examined, no change in status, stable for surgery.  I have reviewed the patient's chart and labs.  Questions were answered to the patient's satisfaction.     Milus Banister

## 2016-12-26 NOTE — Anesthesia Preprocedure Evaluation (Signed)
Anesthesia Evaluation  Patient identified by MRN, date of birth, ID band Patient awake    Reviewed: Allergy & Precautions, NPO status , Patient's Chart, lab work & pertinent test results  Airway Mallampati: II  TM Distance: >3 FB Neck ROM: Full    Dental no notable dental hx.    Pulmonary neg pulmonary ROS,    Pulmonary exam normal breath sounds clear to auscultation       Cardiovascular hypertension, Normal cardiovascular exam Rhythm:Regular Rate:Normal     Neuro/Psych negative neurological ROS  negative psych ROS   GI/Hepatic negative GI ROS, (+) Hepatitis -, B, C  Endo/Other  diabetes  Renal/GU negative Renal ROS  negative genitourinary   Musculoskeletal negative musculoskeletal ROS (+)   Abdominal   Peds negative pediatric ROS (+)  Hematology negative hematology ROS (+)   Anesthesia Other Findings   Reproductive/Obstetrics negative OB ROS                             Anesthesia Physical Anesthesia Plan  ASA: III  Anesthesia Plan: MAC   Post-op Pain Management:    Induction: Intravenous  Airway Management Planned: Nasal Cannula  Additional Equipment:   Intra-op Plan:   Post-operative Plan:   Informed Consent: I have reviewed the patients History and Physical, chart, labs and discussed the procedure including the risks, benefits and alternatives for the proposed anesthesia with the patient or authorized representative who has indicated his/her understanding and acceptance.   Dental advisory given  Plan Discussed with: CRNA and Surgeon  Anesthesia Plan Comments:         Anesthesia Quick Evaluation

## 2016-12-26 NOTE — Anesthesia Postprocedure Evaluation (Signed)
Anesthesia Post Note  Patient: Andyn Maugeri  Procedure(s) Performed: Procedure(s) (LRB): UPPER ENDOSCOPIC ULTRASOUND (EUS) LINEAR (N/A) FINE NEEDLE ASPIRATION (FNA) LINEAR (N/A)  Patient location during evaluation: PACU Anesthesia Type: MAC Level of consciousness: awake and alert Pain management: pain level controlled Vital Signs Assessment: post-procedure vital signs reviewed and stable Respiratory status: spontaneous breathing, nonlabored ventilation, respiratory function stable and patient connected to nasal cannula oxygen Cardiovascular status: stable and blood pressure returned to baseline Anesthetic complications: no       Last Vitals:  Vitals:   12/26/16 0849 12/26/16 0942  BP: (!) 147/107 100/66  Pulse: (!) 112 83  Resp:  (!) 7  Temp: 37.1 C 37.4 C    Last Pain:  Vitals:   12/26/16 0942  TempSrc: Oral                 Lorella Gomez S

## 2016-12-30 ENCOUNTER — Telehealth: Payer: Self-pay | Admitting: *Deleted

## 2016-12-30 ENCOUNTER — Telehealth: Payer: Self-pay | Admitting: Gastroenterology

## 2016-12-30 ENCOUNTER — Encounter (HOSPITAL_COMMUNITY): Payer: Self-pay | Admitting: Gastroenterology

## 2016-12-30 NOTE — Telephone Encounter (Signed)
-----   Message from Ladell Pier, MD sent at 12/28/2016 11:43 AM EST ----- Please call patient, lymph node biopsy positive for cancer, plan to proceed with Y-90 therapy, radiology should be calling him to schedule

## 2016-12-30 NOTE — Telephone Encounter (Signed)
Called pt with biopsy result, per Dr. Gearldine Shown note below. Pt is scheduled for Y90 procedure on 2/1.  Pt reports he needs disability forms completed. He will drop these off at front desk, request we fax forms and let him know when they've been completed.

## 2016-12-30 NOTE — Telephone Encounter (Signed)
Pt returned Dr Ardis Hughs call and will be at this number all afternoon.  [phone]

## 2017-01-02 ENCOUNTER — Other Ambulatory Visit: Payer: Self-pay | Admitting: Interventional Radiology

## 2017-01-02 DIAGNOSIS — C220 Liver cell carcinoma: Secondary | ICD-10-CM

## 2017-01-03 ENCOUNTER — Encounter: Payer: Self-pay | Admitting: Oncology

## 2017-01-03 NOTE — Progress Notes (Signed)
Faxed FMLA paperwork to Earmon Phoenix at [facility], fax [phone]

## 2017-01-06 ENCOUNTER — Telehealth: Payer: Self-pay | Admitting: Oncology

## 2017-01-06 ENCOUNTER — Ambulatory Visit: Payer: 59 | Admitting: Family Medicine

## 2017-01-06 NOTE — Telephone Encounter (Signed)
returned patient's phone to advise FMLA paperwork had been completed and faxed on 01/03/17. also provided phone number for assistance with MYChart lockout

## 2017-01-07 ENCOUNTER — Other Ambulatory Visit: Payer: Self-pay | Admitting: Radiology

## 2017-01-08 ENCOUNTER — Other Ambulatory Visit: Payer: Self-pay | Admitting: Radiology

## 2017-01-09 ENCOUNTER — Encounter (HOSPITAL_COMMUNITY): Payer: Self-pay | Admitting: Interventional Radiology

## 2017-01-09 ENCOUNTER — Other Ambulatory Visit: Payer: Self-pay | Admitting: Interventional Radiology

## 2017-01-09 ENCOUNTER — Ambulatory Visit (HOSPITAL_COMMUNITY)
Admission: RE | Admit: 2017-01-09 | Discharge: 2017-01-09 | Disposition: A | Discharge status: Home / Self Care | Payer: 59 | Source: Ambulatory Visit | Attending: Interventional Radiology | Admitting: Interventional Radiology

## 2017-01-09 ENCOUNTER — Encounter (HOSPITAL_COMMUNITY)
Admission: RE | Admit: 2017-01-09 | Discharge: 2017-01-09 | Disposition: A | Discharge status: Home / Self Care | Payer: 59 | Source: Ambulatory Visit | Attending: Interventional Radiology | Admitting: Interventional Radiology

## 2017-01-09 DIAGNOSIS — Z7982 Long term (current) use of aspirin: Secondary | ICD-10-CM | POA: Diagnosis not present

## 2017-01-09 DIAGNOSIS — C220 Liver cell carcinoma: Secondary | ICD-10-CM

## 2017-01-09 DIAGNOSIS — E785 Hyperlipidemia, unspecified: Secondary | ICD-10-CM | POA: Diagnosis not present

## 2017-01-09 DIAGNOSIS — I10 Essential (primary) hypertension: Secondary | ICD-10-CM | POA: Diagnosis not present

## 2017-01-09 DIAGNOSIS — B181 Chronic viral hepatitis B without delta-agent: Secondary | ICD-10-CM | POA: Diagnosis not present

## 2017-01-09 DIAGNOSIS — E7800 Pure hypercholesterolemia, unspecified: Secondary | ICD-10-CM | POA: Diagnosis not present

## 2017-01-09 DIAGNOSIS — E119 Type 2 diabetes mellitus without complications: Secondary | ICD-10-CM | POA: Diagnosis not present

## 2017-01-09 HISTORY — PX: IR GENERIC HISTORICAL: IMG1180011

## 2017-01-09 LAB — COMPREHENSIVE METABOLIC PANEL
ALBUMIN: 3.5 g/dL (ref 3.5–5.0)
ALT: 19 U/L (ref 17–63)
ANION GAP: 11 (ref 5–15)
AST: 25 U/L (ref 15–41)
Alkaline Phosphatase: 113 U/L (ref 38–126)
BILIRUBIN TOTAL: 0.4 mg/dL (ref 0.3–1.2)
BUN: 10 mg/dL (ref 6–20)
CHLORIDE: 103 mmol/L (ref 101–111)
CO2: 25 mmol/L (ref 22–32)
Calcium: 9.5 mg/dL (ref 8.9–10.3)
Creatinine, Ser: 0.81 mg/dL (ref 0.61–1.24)
GFR calc Af Amer: >60 mL/min (ref >60)
GFR calc non Af Amer: >60 mL/min (ref >60)
GLUCOSE: 133 mg/dL — AB (ref 65–99)
POTASSIUM: 3 mmol/L — AB (ref 3.5–5.1)
SODIUM: 139 mmol/L (ref 135–145)
TOTAL PROTEIN: 8.4 g/dL — AB (ref 6.5–8.1)

## 2017-01-09 LAB — PROTIME-INR
INR: 1.03
Prothrombin Time: 13.5 seconds (ref 11.4–15.2)

## 2017-01-09 LAB — CBC WITH DIFFERENTIAL/PLATELET
BASOS ABS: 0 10*3/uL (ref 0.0–0.1)
BASOS PCT: 0 %
EOS ABS: 0.2 10*3/uL (ref 0.0–0.7)
Eosinophils Relative: 2 %
HCT: 38.5 % — ABNORMAL LOW (ref 39.0–52.0)
Hemoglobin: 12.6 g/dL — ABNORMAL LOW (ref 13.0–17.0)
Lymphocytes Relative: 28 %
Lymphs Abs: 3.1 10*3/uL (ref 0.7–4.0)
MCH: 25 pg — ABNORMAL LOW (ref 26.0–34.0)
MCHC: 32.7 g/dL (ref 30.0–36.0)
MCV: 76.5 fL — ABNORMAL LOW (ref 78.0–100.0)
MONO ABS: 0.9 10*3/uL (ref 0.1–1.0)
MONOS PCT: 8 %
Neutro Abs: 6.8 10*3/uL (ref 1.7–7.7)
Neutrophils Relative %: 62 %
PLATELETS: 411 10*3/uL — AB (ref 150–400)
RBC: 5.03 MIL/uL (ref 4.22–5.81)
RDW: 14.1 % (ref 11.5–15.5)
WBC: 11.1 10*3/uL — ABNORMAL HIGH (ref 4.0–10.5)

## 2017-01-09 LAB — GLUCOSE, CAPILLARY: GLUCOSE-CAPILLARY: 127 mg/dL — AB (ref 65–99)

## 2017-01-09 MED ORDER — MIDAZOLAM HCL 2 MG/2ML IJ SOLN
INTRAMUSCULAR | Status: AC | PRN
  Administered 2017-01-09 (×2): 1 mg via INTRAVENOUS
  Administered 2017-01-09 (×5): 0.5 mg via INTRAVENOUS

## 2017-01-09 MED ORDER — IOPAMIDOL (ISOVUE-300) INJECTION 61%
300.0000 mL | Freq: Once | INTRAVENOUS | Status: AC | PRN
  Administered 2017-01-09: 89 mL via INTRAVENOUS

## 2017-01-09 MED ORDER — TECHNETIUM TO 99M ALBUMIN AGGREGATED
5.5000 | Freq: Once | INTRAVENOUS | Status: AC | PRN
  Administered 2017-01-09: 5.5 via INTRAVENOUS

## 2017-01-09 MED ORDER — FLUMAZENIL 0.5 MG/5ML IV SOLN
INTRAVENOUS | Status: AC
  Filled 2017-01-09: qty 5

## 2017-01-09 MED ORDER — POTASSIUM CHLORIDE CRYS ER 20 MEQ PO TBCR
40.0000 meq | EXTENDED_RELEASE_TABLET | Freq: Once | ORAL | Status: AC
  Administered 2017-01-09: 40 meq via ORAL
  Filled 2017-01-09 (×2): qty 2

## 2017-01-09 MED ORDER — MIDAZOLAM HCL 2 MG/2ML IJ SOLN
INTRAMUSCULAR | Status: AC
  Filled 2017-01-09: qty 6

## 2017-01-09 MED ORDER — SODIUM CHLORIDE 0.9 % IV SOLN
INTRAVENOUS | Status: DC
  Administered 2017-01-09: 08:00:00 via INTRAVENOUS

## 2017-01-09 MED ORDER — LIDOCAINE HCL 1 % IJ SOLN
INTRAMUSCULAR | Status: AC
  Filled 2017-01-09: qty 20

## 2017-01-09 MED ORDER — IOPAMIDOL (ISOVUE-300) INJECTION 61%
INTRAVENOUS | Status: AC
  Filled 2017-01-09: qty 400

## 2017-01-09 MED ORDER — NALOXONE HCL 0.4 MG/ML IJ SOLN
INTRAMUSCULAR | Status: AC
  Filled 2017-01-09: qty 1

## 2017-01-09 MED ORDER — FENTANYL CITRATE (PF) 100 MCG/2ML IJ SOLN
INTRAMUSCULAR | Status: AC
  Filled 2017-01-09: qty 6

## 2017-01-09 MED ORDER — FENTANYL CITRATE (PF) 100 MCG/2ML IJ SOLN
INTRAMUSCULAR | Status: AC | PRN
  Administered 2017-01-09 (×2): 25 ug via INTRAVENOUS
  Administered 2017-01-09: 50 ug via INTRAVENOUS

## 2017-01-09 NOTE — H&P (Signed)
Referring Physician(s): Sherrill,B  Supervising Physician: Arne Cleveland  Patient Status:  Justin [name] OP  Chief Complaint: Hepatocellular carcinoma   Subjective: Patient familiar to IR service from recent consultation with Dr. Vernard Gambles on 12/19/16 to discuss treatment options for right lobe hepatocellular carcinoma. He was deemed an appropriate candidate for Y 90 hepatic radio embolization and presents today for the initial pre-Y 90 roadmapping/test Y 90 dosing.He currently denies fever, headache, chest pain,abdominal pain, back pain, nausea, vomiting or abnormal bleeding. He does have occasional cough as well as some dyspnea with exertion. Past Medical History:  Diagnosis Date  . Cancer ([place])   . Chronic hepatitis B virus infection ([place]) 02/15/2004   Annotation: 02/15/2004:  +Core Ab, +SAg, -SAb, -Be Ag with elevated liver  enzymes HBV DNA via PCR:  <2.3 AFP normal Evaluated by [facility] 07/24/2004--Dr. Patsy [place] Qualifier: Diagnosis of  By: Amil Amen MD, Benjamine Mola    . Diabetes mellitus without complication (HCC)    no meds  . High cholesterol 2006  . Hyperlipidemia   . Hypertension   . Prediabetes 08/31/2016   Lab Results Component Value Date  HGBA1C 6.2 (H) 08/30/2015     Past Surgical History:  Procedure Laterality Date  . EUS N/A 12/26/2016   Procedure: UPPER ENDOSCOPIC ULTRASOUND (EUS) LINEAR;  Surgeon: Milus Banister, MD;  Location: [facility];  Service: Endoscopy;  Laterality: N/A;  . FINE NEEDLE ASPIRATION N/A 12/26/2016   Procedure: FINE NEEDLE ASPIRATION (FNA) LINEAR;  Surgeon: Milus Banister, MD;  [facility]: [facility];  Service: Endoscopy;  Laterality: N/A;  . IR GENERIC HISTORICAL  12/19/2016   IR RADIOLOGIST EVAL & MGMT 12/19/2016 Arne Cleveland, MD [facility] INTERV RAD      Allergies: Lisinopril  Medications: Prior to Admission medications   Medication Sig Start Date End Date Taking? Authorizing Provider  amLODipine (NORVASC) 5 MG tablet Take 1 tablet (5  mg total) by mouth daily. 08/31/16  Yes Gregor Hams, MD  aspirin EC 81 MG tablet Take 1 tablet (81 mg total) by mouth daily. Patient taking differently: Take 81 mg by mouth daily after supper.  10/15/16  Yes Shawnee Knapp, MD  losartan (COZAAR) 100 MG tablet Take 1 tablet (100 mg total) by mouth daily. 12/23/16  Yes Shawnee Knapp, MD  naproxen sodium (ANAPROX) 220 MG tablet Take 220 mg by mouth 2 (two) times daily as needed (for pain/fever/headache.).    Historical Provider, MD     Vital Signs: BP (!) 139/106   Pulse (!) 114   Temp 98.1 F (36.7 C) (Oral)   Resp 20   Ht 5\' 7"  (1.702 m)   Wt 161 lb (73 kg)   SpO2 100%   BMI 25.22 kg/m   Physical Exam Awake, alert. Chest with slightly diminished breath sounds right base, left clear. Heart with slightly tachycardic but regular rhythm. Abdomen soft, positive bowel sounds, nontender.Lower extremities -no edema.  Imaging: No results found.  Labs:  CBC:  Recent Labs  08/31/16 0840 12/07/16 0854 12/23/16 1340 01/09/17 0726  WBC 8.1 12.1* 7.9 11.1*  HGB 13.2 12.7*  --  12.6*  HCT 39.6 36.4* 40.1 38.5*  PLT 449*  --  406* 411*    COAGS:  Recent Labs  01/09/17 0726  INR 1.03    BMP:  Recent Labs  08/31/16 0840 12/07/16 0838 12/23/16 1340 01/09/17 0726  NA 139 139 139 139  K 4.0 3.5 3.8 3.0*  CL 99 97 95* 103  CO2 29 24  27 25  GLUCOSE 156* 221* 114* 133*  BUN 12 9 9 10   CALCIUM 9.1 9.3 9.8 9.5  CREATININE 1.00 0.89 0.83 0.81  GFRNONAA  --  95 97 >60  GFRAA  --  110 112 >60    LIVER FUNCTION TESTS:  Recent Labs  08/31/16 0840 12/07/16 0838 12/23/16 1340 01/09/17 0726  BILITOT 0.6 0.8 0.4 0.4  AST 16 24 24 25   ALT 17 19 19 19   ALKPHOS 73 86 104 113  PROT 7.3 7.6 8.1 8.4*  ALBUMIN 3.7 3.5 3.7 3.5    Assessment and Plan: Patient with history of hepatitis B, metastatic hepatocellular carcinoma with large  posterior right HCC.Seen in consultation recently by Dr. Vernard Gambles and deemed an appropriate candidate  for Y 90 hepatic radio embolization. He presents today for pre-Y 90 roadmapping/test Y 90 dosing. Details/risks of procedure, including but not limited to, internal bleeding, infection, contrast nephropathy, nontarget embolization discussed with patient and family with their understanding and consent. Will give pt K dur 40 meq po now secondary to K of 3.0 this am.    Electronically Signed: D. Rowe Robert 01/09/2017, 8:37 AM   I spent a total of 20 minutes at the the patient's bedside AND on the patient's hospital floor or unit, greater than 50% of which was counseling/coordinating care for visceral/hepatic arteriogram with embolization/test Y 90 dosing

## 2017-01-09 NOTE — Progress Notes (Signed)
Patient given 4meq of potassium po per order prior to transport to IR. Took with sip of water tolerated well. Rechecked BP and 141/98. Patient did take both of his home BP meds this am at 0600.

## 2017-01-09 NOTE — Progress Notes (Signed)
All d/c instructions reviewed with patient and family. Patient ambulated with assist to restroom with RN. Groin site u. VSS. Discharged home with family at 73. Taken to car via wheelchair.

## 2017-01-09 NOTE — Sedation Documentation (Signed)
Pt denies pain or discomfort. Right femoral arterial site level 0. Will continue to monitor. Roselyn Reef Shadoe Bethel,RN

## 2017-01-09 NOTE — Discharge Instructions (Signed)
Call 911 for any emergencies. Hold pressure if any bleeding from right groin and call 911. May call Interventional Radiology with any questions [phone]  No heavy lifting over 15 pounds for two days. May remove bandage in 24 hours. Call MD if any redness, drainage, or swelling at incision site. Call for fever or increased pain.        .Moderate Conscious Sedation, Adult, Care After These instructions provide you with information about caring for yourself after your procedure. Your health care provider may also give you more specific instructions. Your treatment has been planned according to current medical practices, but problems sometimes occur. Call your health care provider if you have any problems or questions after your procedure. What can I expect after the procedure? After your procedure, it is common:  To feel sleepy for several hours.  To feel clumsy and have poor balance for several hours.  To have poor judgment for several hours.  To vomit if you eat too soon. Follow these instructions at home: For at least 24 hours after the procedure:   Do not:  Participate in activities where you could fall or become injured.  Drive.  Use heavy machinery.  Drink alcohol.  Take sleeping pills or medicines that cause drowsiness.  Make important decisions or sign legal documents.  Take care of children on your own.  Rest. Eating and drinking  Follow the diet recommended by your health care provider.  If you vomit:  Drink water, juice, or soup when you can drink without vomiting.  Make sure you have little or no nausea before eating solid foods. General instructions  Have a responsible adult stay with you until you are awake and alert.  Take over-the-counter and prescription medicines only as told by your health care provider.  If you smoke, do not smoke without supervision.  Keep all follow-up visits as told by your health care provider. This is  important. Contact a health care provider if:  You keep feeling nauseous or you keep vomiting.  You feel light-headed.  You develop a rash.  You have a fever. Get help right away if:  You have trouble breathing. This information is not intended to replace advice given to you by your health care provider. Make sure you discuss any questions you have with your health care provider. Document Released: 09/15/2013 Document Revised: 04/29/2016 Document Reviewed: 03/16/2016 Elsevier Interactive Patient Education  2017 [employer].

## 2017-01-09 NOTE — Procedures (Signed)
Selective hepatic arteriogram Gastroduodenal art embolization Intraarterial MAA injection  No complication No blood loss. See complete dictation in [facility].

## 2017-01-09 NOTE — Sedation Documentation (Signed)
Bilateral pedal pulses 3+ pre and post procedure. Roselyn Reef Tyrie Porzio,RN

## 2017-01-09 NOTE — Sedation Documentation (Signed)
Patient transported to [place] for post procedure imaging. Will continue to monitor.  Roselyn Reef Berk Pilot,RN

## 2017-01-16 ENCOUNTER — Ambulatory Visit (HOSPITAL_BASED_OUTPATIENT_CLINIC_OR_DEPARTMENT_OTHER): Payer: 59 | Admitting: Oncology

## 2017-01-16 ENCOUNTER — Ambulatory Visit (HOSPITAL_BASED_OUTPATIENT_CLINIC_OR_DEPARTMENT_OTHER): Payer: 59

## 2017-01-16 ENCOUNTER — Other Ambulatory Visit: Payer: Self-pay | Admitting: *Deleted

## 2017-01-16 VITALS — BP 120/96 | HR 116 | Temp 99.1°F | Resp 18 | Ht 67.0 in | Wt 157.8 lb

## 2017-01-16 DIAGNOSIS — R590 Localized enlarged lymph nodes: Secondary | ICD-10-CM

## 2017-01-16 DIAGNOSIS — R634 Abnormal weight loss: Secondary | ICD-10-CM

## 2017-01-16 DIAGNOSIS — R630 Anorexia: Secondary | ICD-10-CM

## 2017-01-16 DIAGNOSIS — D509 Iron deficiency anemia, unspecified: Secondary | ICD-10-CM

## 2017-01-16 DIAGNOSIS — R05 Cough: Secondary | ICD-10-CM

## 2017-01-16 DIAGNOSIS — R918 Other nonspecific abnormal finding of lung field: Secondary | ICD-10-CM | POA: Diagnosis not present

## 2017-01-16 DIAGNOSIS — C220 Liver cell carcinoma: Secondary | ICD-10-CM

## 2017-01-16 DIAGNOSIS — I10 Essential (primary) hypertension: Secondary | ICD-10-CM

## 2017-01-16 DIAGNOSIS — R509 Fever, unspecified: Secondary | ICD-10-CM

## 2017-01-16 LAB — CBC WITH DIFFERENTIAL/PLATELET
BASO%: 0.7 % (ref 0.0–2.0)
Basophils Absolute: 0.1 10*3/uL (ref 0.0–0.1)
EOS ABS: 0.3 10*3/uL (ref 0.0–0.5)
EOS%: 2 % (ref 0.0–7.0)
HCT: 39.3 % (ref 38.4–49.9)
HGB: 12.7 g/dL — ABNORMAL LOW (ref 13.0–17.1)
LYMPH%: 19.3 % (ref 14.0–49.0)
MCH: 24.8 pg — ABNORMAL LOW (ref 27.2–33.4)
MCHC: 32.4 g/dL (ref 32.0–36.0)
MCV: 76.4 fL — AB (ref 79.3–98.0)
MONO#: 1.2 10*3/uL — ABNORMAL HIGH (ref 0.1–0.9)
MONO%: 8.6 % (ref 0.0–14.0)
NEUT%: 69.4 % (ref 39.0–75.0)
NEUTROS ABS: 9.7 10*3/uL — AB (ref 1.5–6.5)
Platelets: 385 10*3/uL (ref 140–400)
RBC: 5.14 10*6/uL (ref 4.20–5.82)
RDW: 14.7 % — ABNORMAL HIGH (ref 11.0–14.6)
WBC: 13.9 10*3/uL — ABNORMAL HIGH (ref 4.0–10.3)
lymph#: 2.7 10*3/uL (ref 0.9–3.3)

## 2017-01-16 LAB — CHCC SMEAR

## 2017-01-16 MED ORDER — AMLODIPINE BESYLATE 10 MG PO TABS: 10.0000 mg | ORAL_TABLET | Freq: Every day | ORAL | 0 refills | Status: DC

## 2017-01-16 NOTE — Progress Notes (Addendum)
  [place] OFFICE PROGRESS NOTE   Diagnosis: Hepatocellular carcinoma  INTERVAL HISTORY:   Mr.Devall returns as scheduled. He underwent an EUS biopsy by Dr. Ardis Hughs on 12/26/2016. A malignant appearing lymph node was visualized in the lower paraesophageal mediastinum measuring 22 mm. An FNA biopsy confirmed malignant cells consistent with hepatocellular carcinoma.  He saw Dr. Vernard Gambles and underwent a planning study for radio embolization. No significant extrahepatic radiotracer was noted. He is scheduled for the treatment dose on 01/23/2017. He reports recent anemia. No bleeding. On 01/09/2007 the hemoglobin returned at 12.6 with an MCV of 76.5. The ferritin returned at 614 with a serum iron of 27 on 12/23/2016. He reports no history of anemia.  He has a cough with exertion and deep inspiration. This has been present for months.  Objective:  Vital signs in last 24 hours:  Blood pressure (!) 138/103, pulse (!) 116, temperature 99.1 F (37.3 C), temperature source Oral, resp. rate 18, height 5\' 7"  (1.702 m), weight 157 lb 12.8 oz (71.6 kg), SpO2 99 %.    Resp:  lungs clear bilaterally, no respiratory distress Cardio:  regular rate and rhythm GI:  no hepatomegaly, nontender Vascular:  no leg edema   Lab Results:  Lab Results  Component Value Date   WBC 11.1 (H) 01/09/2017   HGB 12.6 (L) 01/09/2017   HCT 38.5 (L) 01/09/2017   MCV 76.5 (L) 01/09/2017   PLT 411 (H) 01/09/2017   NEUTROABS 6.8 01/09/2017   Blood smear: Normal platelets, atypical lymphocytes, a few ovalocytes. The polychromasia is not increased.  Medications: I have reviewed the patient's current medications.  Assessment/Plan: 1. Hepatocellular carcinoma ? MRI abdomen 12/12/2016 consistent with a right hepatic HCC measuring 12.3 x 12.7 cm ? Markedly elevated AFP ? History of hepatitis B ? CTs chest, abdomen, and pelvis 12/17/2016-large right liver mass involving segments 7 and 8, possible  involvement of the left liver. Nonspecific right lung nodules, nonspecific porta hepatis nodes, 1.5 cm right lower paraesophageal mediastinal node ? EUS biopsy of the paraesophageal lymph node on 12/26/2016 confirms metastatic hepatocellular carcinoma   2. Chronic hepatitis B infection  3. Fever  4. Nonproductive cough  5. Anorexia/weight loss  6.  Microcytic anemia-likely iron deficiency as the red cell microcytosis is new. He will submit stool Hemoccult cards and begin ferrous sulfate. We will check the peripheral blood smear today.  7.  Hypertension-amlodipine dose increased to 10 mg 01/16/2017    Disposition:   [name] has been diagnosed with metastatic hepatocellular carcinoma. He is scheduled to undergo radioembolization of the dominant symptomatic right liver mass on 01/23/2017. We will plan for restaging CTs 2-3 months following therapy to follow-up on the liver lesion, lung nodules, and chest adenopathy. I discussed systemic treatment options with the patient and his family today. We will consider immunotherapy depending on the restaging CT results.  He appears to have iron deficiency anemia. We will check stool Hemoccult cards. I will refer him to Dr. Ardis Hughs if the Hemoccult cards returned positive.  He has hypertension on repeat office visits. He will increase the amlodipine dose. I recommended he follow-up with Dr. Brigitte Pulse.  25 minutes were spent with the patient today. The majority of the time was used for counseling and coordination of care.  Betsy Coder, MD  01/16/2017  1:01 PM

## 2017-01-19 NOTE — Progress Notes (Signed)
Subjective:    Patient ID: Justin [name], male    DOB: 31-Jan-1961, 56 y.o.   MRN: [id number]  Chief Complaint  Patient presents with  . Medication Problem    wants to change lisinopril and increase Norvasc    HPI  Justin [name] is here today with his wife. He was recently diagnosed with [place] from Hepatitis B.  He really wants to try to keep excellent control of his chronic medical problems so that he is prepared for treatment.  He was diagnosed with type 2 DM last yr.  He has been talking with a physician friend who made the above recommendations to his BP med regimen.  Past Medical History:  Diagnosis Date  . Cancer ([place])   . Chronic hepatitis B virus infection ([place]) 02/15/2004   Annotation: 02/15/2004:  +Core Ab, +SAg, -SAb, -Be Ag with elevated liver  enzymes HBV DNA via PCR:  <2.3 AFP normal Evaluated by [name] 07/24/2004--Dr. Patsy [name] Qualifier: Diagnosis of  By: Amil Amen MD, Benjamine Mola    . Diabetes mellitus without complication (HCC)    no meds  . High cholesterol 2006  . Hyperlipidemia   . Hypertension   . Prediabetes 08/31/2016   Lab Results Component Value Date  HGBA1C 6.2 (H) 08/30/2015     Past Surgical History:  Procedure Laterality Date  . EUS N/A 12/26/2016   Procedure: UPPER ENDOSCOPIC ULTRASOUND (EUS) LINEAR;  Surgeon: Milus Banister, MD;  Location[facility];  Service: Endoscopy;  Laterality: N/A;  . FINE NEEDLE ASPIRATION N/A 12/26/2016   Procedure: FINE NEEDLE ASPIRATION (FNA) LINEAR;  Surgeon: Milus Banister, MD;  [facility];  Service: Endoscopy;  Laterality: N/A;  . IR GENERIC HISTORICAL  12/19/2016   IR RADIOLOGIST EVAL & MGMT 12/19/2016 Arne Cleveland, MD [facility] INTERV RAD  . IR GENERIC HISTORICAL  01/09/2017   IR ANGIOGRAM SELECTIVE EACH ADDITIONAL VESSEL 01/09/2017 Arne Cleveland, MD [facility]  . IR GENERIC HISTORICAL  01/09/2017   IR ANGIOGRAM VISCERAL SELECTIVE 01/09/2017 Arne Cleveland, MD [facility]  . IR GENERIC HISTORICAL   01/09/2017   IR ANGIOGRAM SELECTIVE EACH ADDITIONAL VESSEL 01/09/2017 Arne Cleveland, MD [facility]  . IR GENERIC HISTORICAL  01/09/2017   IR ANGIOGRAM VISCERAL SELECTIVE 01/09/2017 Arne Cleveland, MD [facility]  . IR GENERIC HISTORICAL  01/09/2017   IR ANGIOGRAM SELECTIVE EACH ADDITIONAL VESSEL 01/09/2017 Arne Cleveland, MD [facility]  . IR GENERIC HISTORICAL  01/09/2017   IR US GUIDE VASC ACCESS RIGHT 01/09/2017 Arne Cleveland, MD [facility]  . IR GENERIC HISTORICAL  01/09/2017   IR EMBO ARTERIAL NOT HEMORR HEMANG INC GUIDE ROADMAPPING 01/09/2017 Arne Cleveland, MD [facility]   Current Outpatient Prescriptions on File Prior to Visit  Medication Sig Dispense Refill  . aspirin EC 81 MG tablet Take 1 tablet (81 mg total) by mouth daily. (Patient taking differently: Take 81 mg by mouth daily after supper. )    . naproxen sodium (ANAPROX) 220 MG tablet Take 220 mg by mouth 2 (two) times daily as needed (for pain/fever/headache.).     No current facility-administered medications on file prior to visit.    Allergies  Allergen Reactions  . Lisinopril Other (See Comments)    coughing   Family History  Problem Relation Age of Onset  . Heart disease Father   . Hyperlipidemia Father    Social History   Social History  . Marital status: Married    Spouse name: N/A  . Number of children: N/A  .  Years of education: N/A   Social History Main Topics  . Smoking status: Never Smoker  . Smokeless tobacco: Never Used  . Alcohol use No  . Drug use: No  . Sexual activity: No   Other Topics Concern  . None   Social History Narrative   Married, wife Justin [name]   Employed at [employer] in [place]   Has lived in [place] over 20 years   Depression screen [facility] 2/9 12/23/2016 12/07/2016 10/15/2016 08/31/2016 08/30/2015  Decreased Interest 0 0 0 0 0  Down, Depressed, Hopeless 0 0 0 0 0  PHQ - 2 Score 0 0 0 0 0     Review of Systems  Constitutional: Positive for  appetite change, fatigue, fever and unexpected weight change. Negative for activity change.  Gastrointestinal: Positive for abdominal distention and abdominal pain. Negative for constipation, diarrhea, nausea and vomiting.  Psychiatric/Behavioral: Negative for agitation, behavioral problems, confusion, decreased concentration, dysphoric mood and sleep disturbance. The patient is not nervous/anxious.        Objective:   Physical Exam  Constitutional: He is oriented to person, place, and time. He appears well-developed and well-nourished. No distress.  HENT:  Head: Normocephalic and atraumatic.  Eyes: Conjunctivae are normal. Pupils are equal, round, and reactive to light. No scleral icterus.  Neck: Normal range of motion. Neck supple. No thyromegaly present.  Cardiovascular: Normal rate, regular rhythm, normal heart sounds and intact distal pulses.   Pulmonary/Chest: Effort normal and breath sounds normal. No respiratory distress.  Musculoskeletal: He exhibits no edema.  Lymphadenopathy:    He has no cervical adenopathy.  Neurological: He is alert and oriented to person, place, and time.  Skin: Skin is warm and dry. He is not diaphoretic.  Psychiatric: He has a normal mood and affect. His behavior is normal.      BP (!) 144/98 (BP Location: Left Arm, Patient Position: Sitting, Cuff Size: Small)   Pulse (!) 108   Temp 98.9 F (37.2 C) (Oral)   Resp 16   Ht 5\' 7"  (1.702 m)   Wt 161 lb 12.8 oz (73.4 kg)   SpO2 100%   BMI 25.34 kg/m     Assessment & Plan:   1. Essential hypertension, benign - new diagnosis DM so agree w/ consolidating BP meds, stop lisinopril-hctz 20-12.5 and amlodipine 5, start losartan 100. Recheck in 1 mo  2. Anemia, unspecified type   3. Type 2 diabetes mellitus without complication, without long-term current use of insulin (HCC) - cont on tlc  4. Hepatocellular carcinoma ([place])   5. Dyslipidemia     Orders Placed This Encounter  Procedures  . CBC with  Differential/Platelet  . Comprehensive metabolic panel  . Ferritin  . Iron  . IBC Panel    Meds ordered this encounter  Medications  . losartan (COZAAR) 100 MG tablet    Sig: Take 1 tablet (100 mg total) by mouth daily.    Dispense:  90 tablet    Refill:  3    Delman Cheadle, M.D.  Primary Care at [facility] [address] [place], [place] [address] [phone] phone [phone] fax  01/19/17 1:29 AM

## 2017-01-21 ENCOUNTER — Telehealth: Payer: Self-pay

## 2017-01-21 ENCOUNTER — Other Ambulatory Visit: Payer: Self-pay | Admitting: Student

## 2017-01-21 NOTE — Telephone Encounter (Signed)
Patient called stating he had stomach pains. Called pt back, states the stomach pains are off and on. Going to the bathroom regularly. Stools are dark and is taking tums occasionally to ease the pain. States he has a stool sample card he will be bringing into the lab tomorrow, will inform MD for further instruction

## 2017-01-22 ENCOUNTER — Ambulatory Visit (HOSPITAL_BASED_OUTPATIENT_CLINIC_OR_DEPARTMENT_OTHER): Payer: 59

## 2017-01-22 ENCOUNTER — Other Ambulatory Visit: Payer: Self-pay | Admitting: Radiology

## 2017-01-22 DIAGNOSIS — D509 Iron deficiency anemia, unspecified: Secondary | ICD-10-CM

## 2017-01-22 LAB — FECAL OCCULT BLOOD, GUAIAC: Occult Blood: NEGATIVE

## 2017-01-23 ENCOUNTER — Ambulatory Visit (HOSPITAL_COMMUNITY)
Admission: RE | Admit: 2017-01-23 | Discharge: 2017-01-23 | Disposition: A | Discharge status: Home / Self Care | Payer: 59 | Source: Ambulatory Visit | Attending: Interventional Radiology | Admitting: Interventional Radiology

## 2017-01-23 ENCOUNTER — Telehealth: Payer: Self-pay | Admitting: Oncology

## 2017-01-23 ENCOUNTER — Encounter (HOSPITAL_COMMUNITY): Payer: Self-pay

## 2017-01-23 ENCOUNTER — Encounter (HOSPITAL_COMMUNITY)
Admission: RE | Admit: 2017-01-23 | Discharge: 2017-01-23 | Disposition: A | Discharge status: Home / Self Care | Payer: 59 | Source: Ambulatory Visit | Attending: Interventional Radiology | Admitting: Interventional Radiology

## 2017-01-23 ENCOUNTER — Ambulatory Visit (HOSPITAL_BASED_OUTPATIENT_CLINIC_OR_DEPARTMENT_OTHER): Payer: 59 | Admitting: Oncology

## 2017-01-23 ENCOUNTER — Ambulatory Visit: Payer: 59

## 2017-01-23 ENCOUNTER — Ambulatory Visit (HOSPITAL_COMMUNITY)
Admission: RE | Admit: 2017-01-23 | Discharge: 2017-01-23 | Disposition: A | Discharge status: Home / Self Care | Payer: 59 | Source: Ambulatory Visit | Attending: General Surgery | Admitting: General Surgery

## 2017-01-23 VITALS — BP 133/98 | HR 124 | Temp 99.2°F | Resp 18 | Ht 67.0 in | Wt 152.8 lb

## 2017-01-23 VITALS — BP 123/99 | HR 123 | Temp 98.5°F | Resp 18

## 2017-01-23 DIAGNOSIS — D72829 Elevated white blood cell count, unspecified: Secondary | ICD-10-CM | POA: Diagnosis not present

## 2017-01-23 DIAGNOSIS — C220 Liver cell carcinoma: Secondary | ICD-10-CM

## 2017-01-23 DIAGNOSIS — I10 Essential (primary) hypertension: Secondary | ICD-10-CM

## 2017-01-23 DIAGNOSIS — Z539 Procedure and treatment not carried out, unspecified reason: Secondary | ICD-10-CM | POA: Diagnosis not present

## 2017-01-23 DIAGNOSIS — R5381 Other malaise: Secondary | ICD-10-CM

## 2017-01-23 DIAGNOSIS — Z8249 Family history of ischemic heart disease and other diseases of the circulatory system: Secondary | ICD-10-CM | POA: Insufficient documentation

## 2017-01-23 DIAGNOSIS — R109 Unspecified abdominal pain: Secondary | ICD-10-CM

## 2017-01-23 DIAGNOSIS — R509 Fever, unspecified: Secondary | ICD-10-CM

## 2017-01-23 DIAGNOSIS — R17 Unspecified jaundice: Secondary | ICD-10-CM

## 2017-01-23 DIAGNOSIS — Z7189 Other specified counseling: Secondary | ICD-10-CM | POA: Insufficient documentation

## 2017-01-23 DIAGNOSIS — E119 Type 2 diabetes mellitus without complications: Secondary | ICD-10-CM | POA: Diagnosis not present

## 2017-01-23 DIAGNOSIS — B181 Chronic viral hepatitis B without delta-agent: Secondary | ICD-10-CM | POA: Diagnosis not present

## 2017-01-23 DIAGNOSIS — E7800 Pure hypercholesterolemia, unspecified: Secondary | ICD-10-CM | POA: Insufficient documentation

## 2017-01-23 DIAGNOSIS — R05 Cough: Secondary | ICD-10-CM

## 2017-01-23 DIAGNOSIS — R0600 Dyspnea, unspecified: Secondary | ICD-10-CM

## 2017-01-23 LAB — CBC
HCT: 37.9 % — ABNORMAL LOW (ref 39.0–52.0)
Hemoglobin: 12.7 g/dL — ABNORMAL LOW (ref 13.0–17.0)
MCH: 25.6 pg — AB (ref 26.0–34.0)
MCHC: 33.5 g/dL (ref 30.0–36.0)
MCV: 76.3 fL — AB (ref 78.0–100.0)
PLATELETS: 352 10*3/uL (ref 150–400)
RBC: 4.97 MIL/uL (ref 4.22–5.81)
RDW: 14.7 % (ref 11.5–15.5)
WBC: 18 10*3/uL — AB (ref 4.0–10.5)

## 2017-01-23 LAB — COMPREHENSIVE METABOLIC PANEL
ALK PHOS: 108 U/L (ref 38–126)
ALT: 21 U/L (ref 17–63)
ANION GAP: 11 (ref 5–15)
AST: 28 U/L (ref 15–41)
Albumin: 3.5 g/dL (ref 3.5–5.0)
BILIRUBIN TOTAL: 1.6 mg/dL — AB (ref 0.3–1.2)
BUN: 14 mg/dL (ref 6–20)
CO2: 28 mmol/L (ref 22–32)
Calcium: 12.3 mg/dL — ABNORMAL HIGH (ref 8.9–10.3)
Chloride: 99 mmol/L — ABNORMAL LOW (ref 101–111)
Creatinine, Ser: 0.91 mg/dL (ref 0.61–1.24)
GFR calc non Af Amer: >60 mL/min (ref >60)
Glucose, Bld: 147 mg/dL — ABNORMAL HIGH (ref 65–99)
Potassium: 3.2 mmol/L — ABNORMAL LOW (ref 3.5–5.1)
SODIUM: 138 mmol/L (ref 135–145)
TOTAL PROTEIN: 8.9 g/dL — AB (ref 6.5–8.1)

## 2017-01-23 LAB — APTT: aPTT: 36 seconds (ref 24–36)

## 2017-01-23 LAB — PROTIME-INR
INR: 1.16
PROTHROMBIN TIME: 14.8 s (ref 11.4–15.2)

## 2017-01-23 LAB — GLUCOSE, CAPILLARY: Glucose-Capillary: 143 mg/dL — ABNORMAL HIGH (ref 65–99)

## 2017-01-23 MED ORDER — SODIUM CHLORIDE 0.9 % IV SOLN
8.0000 mg | Freq: Once | INTRAVENOUS | Status: DC
  Filled 2017-01-23: qty 4

## 2017-01-23 MED ORDER — PANTOPRAZOLE SODIUM 40 MG IV SOLR: 40.0000 mg | Freq: Once | INTRAVENOUS | Status: DC

## 2017-01-23 MED ORDER — SODIUM CHLORIDE 0.9 % IV SOLN
1500.0000 mL | Freq: Once | INTRAVENOUS | Status: AC
  Administered 2017-01-23: 1500 mL via INTRAVENOUS

## 2017-01-23 MED ORDER — PROCHLORPERAZINE MALEATE 10 MG PO TABS: 10.0000 mg | ORAL_TABLET | Freq: Four times a day (QID) | ORAL | 0 refills | Status: AC | PRN

## 2017-01-23 MED ORDER — IOPAMIDOL (ISOVUE-300) INJECTION 61%
100.0000 mL | Freq: Once | INTRAVENOUS | Status: AC | PRN
  Administered 2017-01-23: 100 mL via INTRAVENOUS

## 2017-01-23 MED ORDER — IOPAMIDOL (ISOVUE-300) INJECTION 61%
INTRAVENOUS | Status: AC
  Filled 2017-01-23: qty 100

## 2017-01-23 MED ORDER — ZOLEDRONIC ACID 4 MG/100ML IV SOLN
4.0000 mg | Freq: Once | INTRAVENOUS | Status: AC
  Administered 2017-01-23: 4 mg via INTRAVENOUS
  Filled 2017-01-23: qty 100

## 2017-01-23 MED ORDER — PIPERACILLIN-TAZOBACTAM 3.375 G IVPB 30 MIN
3.3750 g | Freq: Once | INTRAVENOUS | Status: DC
  Filled 2017-01-23: qty 50

## 2017-01-23 MED ORDER — OXYCODONE-ACETAMINOPHEN 5-325 MG PO TABS: 1.0000 | ORAL_TABLET | ORAL | 0 refills | Status: DC | PRN

## 2017-01-23 MED ORDER — SODIUM CHLORIDE 0.9 % IJ SOLN
INTRAMUSCULAR | Status: AC
  Filled 2017-01-23: qty 50

## 2017-01-23 MED ORDER — ONDANSETRON HCL 8 MG PO TABS: 8.0000 mg | ORAL_TABLET | Freq: Three times a day (TID) | ORAL | 1 refills | Status: AC | PRN

## 2017-01-23 MED ORDER — SODIUM CHLORIDE 0.9 % IV SOLN
INTRAVENOUS | Status: DC
  Administered 2017-01-23: 08:00:00 via INTRAVENOUS

## 2017-01-23 MED ORDER — POTASSIUM CHLORIDE CRYS ER 20 MEQ PO TBCR: 20.0000 meq | EXTENDED_RELEASE_TABLET | Freq: Every day | ORAL | 0 refills | Status: AC

## 2017-01-23 NOTE — Telephone Encounter (Signed)
Received disability paperwork to be completed  °

## 2017-01-23 NOTE — Telephone Encounter (Signed)
Called patient to let him know about his appointments on 3/9.  He said that he came for a scan and that he needed to be seen to day or tomorrow so I transferred him to the RN for Dr Benay Spice

## 2017-01-23 NOTE — Telephone Encounter (Signed)
Spoke with pt, appt given for today at 3:30 to review scan result.

## 2017-01-23 NOTE — Progress Notes (Signed)
[place] OFFICE PROGRESS NOTE   Diagnosis: Hepatocellular carcinoma  INTERVAL HISTORY:   [name] returns prior to a scheduled visit. He was scheduled to undergo radioembolization of the right liver in interventional radiology earlier today. He was noted to have an elevated white count and bilirubin. A CT of the abdomen and pelvis confirmed marked progression of hepatocellular carcinoma and the procedure was aborted. He has noted increased pain in the upper abdomen. He reports vomiting on 3 days this week. He has exertional dyspnea and a persistent cough. He reports malaise. He is having bowel movements.  Objective:  Vital signs in last 24 hours:  Blood pressure (!) 133/98, pulse (!) 124, temperature 99.2 F (37.3 C), temperature source Oral, resp. rate 18, height 5\' 7"  (1.702 m), weight 152 lb 12.8 oz (69.3 kg), SpO2 98 %.    HEENT: Neck without mass Lymphatics: No cervical or supraclavicular nodes Resp: Lungs clear bilaterally, no respiratory distress Cardio: Regular rate and rhythm, tachycardia GI: Tender in the mid upper abdomen, no mass, no hepatosplenomegaly Vascular: No leg edema  Skin: No rash, abdominal wall nodule measuring approximate 1 cm at the right lower abdomen     Lab Results:  Lab Results  Component Value Date   WBC 18.0 (H) 01/23/2017   HGB 12.7 (L) 01/23/2017   HCT 37.9 (L) 01/23/2017   MCV 76.3 (L) 01/23/2017   PLT 352 01/23/2017   NEUTROABS 9.7 (H) 01/16/2017    BUN 14, creatinine 0.91, potassium 3.2, calcium 12.3, albumin 3.5   Imaging:  Ct Abdomen Pelvis W Contrast  Result Date: 01/23/2017 CLINICAL DATA:  Large right hepatic lobe hepatocellular carcinoma, with planned radio embolization. Cough. Abdominal pain and elevated bilirubin EXAM: CT ABDOMEN AND PELVIS WITH CONTRAST TECHNIQUE: Multidetector CT imaging of the abdomen and pelvis was performed using the standard protocol following bolus administration of intravenous  contrast. CONTRAST:  139mL ISOVUE-300 IOPAMIDOL (ISOVUE-300) INJECTION 61% COMPARISON:  Multiple exams, including 12/12/2016 MRI and CT of 12/17/2016 FINDINGS: Lower chest: Abnormal right infrahilar low-density lymph node 1.4 cm in short axis on image 6/2. Extensive subcarinal and paraesophageal low-density mass significantly increased over the past 5 weeks, measuring up to 3.3 by 7.0 cm on image 6/2. A distal paraesophageal node measures 2.8 cm in short axis on image 16/2. Possible invasion of the large right hepatocellular carcinoma through the right hemidiaphragm on images 9/2. Hepatobiliary: The mass arising from the right hepatic lobe measures 17.0 by 13.8 by 12.6 cm (volume = 1550 cm^3), a significant enlargement compared to prior. This appears to extend across into the left hepatic lobe adjacent to the IVC and displaces and flattens the intrahepatic IVC significantly. Pancreas: There is a new 8 mm focus of hypoenhancement in the pancreatic head near the junction with the uncinate process on image 42/2, not readily visible on the prior exam. Early metastatic lesion not excluded. Spleen: Unremarkable Adrenals/Urinary Tract: New metastatic lesions to the left adrenal gland, largest 1.8 by 2.9 cm. New metastatic lesions to the right adrenal gland, including a 2.2 by 1.8 cm posterior nodule and a 2.5 by 1.2 cm lateral limb nodule. There is a new a hypodense nodular lesion in the right mid kidney measuring 9 mm in diameter on image 42/2, although this resembles a cyst the fact that is new compared to an exam from 5 weeks ago raises concern for possible metastatic lesion. There is also a new small right perirenal nodule measuring about 10 mm in diameter on image 44/2. New  anterior right perirenal nodule 1.0 by 0.8 cm on image 36/2. Stomach/Bowel: Unremarkable Vascular/Lymphatic: Coils near the origin of the gastroduodenal artery, no visualized patency of the GDA observed. Porta hepatis and peripancreatic adenopathy  an index node adjacent to the upper IVC measures 1.7 cm in short axis on image 38/2. Reproductive: Unremarkable Other: New omental nodule 1.2 by 0.8 cm, image 57/2. New anterior abdominal wall subcutaneous nodule, 1.6 by 1.2 cm on image 70/2. New left anterior omental nodule 0.6 cm in diameter on image 51/2. New left anterior abdominal wall subcutaneous nodule, 1.7 by 1.1 cm on image 85/2. New anterior subcutaneous nodule in the lower pelvis eccentric to the right, 1.2 by 0.9 cm on image 91/2. Musculoskeletal: Sacralized L5. Mild left foraminal stenosis at L4-5 due to spurring. IMPRESSION: 1. Rapidly progressive metastatic disease and enlarging right hepatic lobe mass. The mass now measures 1550 cubic cm with new metastatic lesions to the right infrahilar region, subcarinal and para sat esophageal regions, right perirenal space, anterior subcutaneous tissues, omentum, porta hepatis, pancreatic head, and right kidney. The tumor may be invading through the right hemidiaphragm. 2. Coils near the origin of the gastroduodenal artery without patency of the GDA observed. Electronically Signed   By: Van Clines M.D.   On: 01/23/2017 11:12    Medications: I have reviewed the patient's current medications.  Assessment/Plan: 1. Hepatocellular carcinoma ? MRI abdomen 12/12/2016 consistent with a right hepatic HCC measuring 12.3 x 12.7 cm ? Markedly elevated AFP ? History of hepatitis B ? CTs chest, abdomen, and pelvis 12/17/2016-large right liver mass involving segments 7 and 8, possible involvement of the left liver. Nonspecific right lung nodules, nonspecific porta hepatis nodes, 1.5 cm right lower paraesophageal mediastinal node ? EUS biopsy of the paraesophageal lymph node on 12/26/2016 confirms metastatic hepatocellular carcinoma  ? CT abdomen and pelvis 01/23/2017 with marked progression of the right liver mass, hilar/esophageal lymph nodes, omentum, pancreas head, and subcutaneous nodules  2.  Chronic hepatitis B infection  3. Fever  4. Nonproductive cough  5. Anorexia/weight loss  6.  Microcytic anemia-likely iron deficiency as the red cell microcytosis is new. Stool Hemoccult cards negative on 01/22/2017  7.  Hypertension-amlodipine dose increased to 10 mg 01/16/2017  8.  Hypercalcemia of malignancy 01/23/2017-Zometa given 01/23/2017    Disposition:  Justin [name] has metastatic hepatocellular carcinoma. He was scheduled to undergo radioembolization of the dominant right liver lesion in radiology today. A CT confirms marked progression of extrahepatic metastatic disease. The radio embolization was aborted.  I reviewed the CT images and discussed treatment options with Mr. Coxwell and multiple family members. We discussed sorafenib, immunotherapy, and systemic chemotherapy. I favor a trial of systemic chemotherapy given the rapid and symptomatic disease progression. He understands a small chance of obtaining a clinical remission with any form of systemic therapy. I discussed the case with the GI oncology service at [facility]. He is eligible for a clinical trial there, but it would take several weeks for him to begin treatment. We recommend proceeding with chemotherapy now.  I recommend gemcitabine/oxaliplatin. I reviewed the potential toxicities associated with this chemotherapy regimen including the chance for nausea/vomiting and hematologic toxicity. We discussed the rash, fever, and pneumonitis associated with gemcitabine. We discussed the allergic reaction and various types of neuropathy seen with oxaliplatin. He will attend a chemotherapy teaching class.  The plan is to complete a first cycle of chemotherapy with peripheral IV access. He will then be referred for placement of a Port-A-Cath.  He  has hypercalcemia of malignancy. He will receive intravenous fluids and Zometa today. We will check a chem strip panel when he returns for chemotherapy on  01/28/2017.  Approximately 45 minutes were spent with the patient today. The majority of the time was used for counseling and coordination of care. Betsy Coder, MD  01/23/2017  3:50 PM

## 2017-01-23 NOTE — Progress Notes (Signed)
Patient back from radiology. Procedure cancelled per MD. Patient's family has script from Dr. Vernard Gambles. They are aware Dr. Gearldine Shown office will be in touch with them. Discharged home with family. Escorted to car via w/c.

## 2017-01-23 NOTE — Patient Instructions (Addendum)
[facility] Discharge Instructions for Patients Receiving Chemotherapy  Today you received the following chemotherapy agents Zoledronic Acid  To help prevent nausea and vomiting after your treatment, we encourage you to take your nausea medication as prescribed.  If you develop nausea and vomiting that is not controlled by your nausea medication, call the clinic.   BELOW ARE SYMPTOMS THAT SHOULD BE REPORTED IMMEDIATELY:  *FEVER GREATER THAN 100.5 F  *CHILLS WITH OR WITHOUT FEVER  NAUSEA AND VOMITING THAT IS NOT CONTROLLED WITH YOUR NAUSEA MEDICATION  *UNUSUAL SHORTNESS OF BREATH  *UNUSUAL BRUISING OR BLEEDING  TENDERNESS IN MOUTH AND THROAT WITH OR WITHOUT PRESENCE OF ULCERS  *URINARY PROBLEMS  *BOWEL PROBLEMS  UNUSUAL RASH Items with * indicate a potential emergency and should be followed up as soon as possible.  Feel free to call the clinic you have any questions or concerns. The clinic phone number is [phone].  Please show the [facility] at check-in to the Emergency Department and triage nurse.   Dehydration, Adult Dehydration is when there is not enough fluid or water in your body. This happens when you lose more fluids than you take in. Dehydration can range from mild to very bad. It should be treated right away to keep it from getting very bad. Symptoms of mild dehydration may include:  Thirst.  Dry lips.  Slightly dry mouth.  Dry, warm skin.  Dizziness. Symptoms of moderate dehydration may include:  Very dry mouth.  Muscle cramps.  Dark pee (urine). Pee may be the color of tea.  Your body making less pee.  Your eyes making fewer tears.  Heartbeat that is uneven or faster than normal (palpitations).  Headache.  Light-headedness, especially when you stand up from sitting.  Fainting (syncope). Symptoms of very bad dehydration may include:  Changes in skin, such as:  Cold and clammy skin.  Blotchy (mottled) or pale  skin.  Skin that does not quickly return to normal after being lightly pinched and let go (poor skin turgor).  Changes in body fluids, such as:  Feeling very thirsty.  Your eyes making fewer tears.  Not sweating when body temperature is high, such as in hot weather.  Your body making very little pee.  Changes in vital signs, such as:  Weak pulse.  Pulse that is more than 100 beats a minute when you are sitting still.  Fast breathing.  Low blood pressure.  Other changes, such as:  Sunken eyes.  Cold hands and feet.  Confusion.  Lack of energy (lethargy).  Trouble waking up from sleep.  Short-term weight loss.  Unconsciousness. Follow these instructions at home:  If told by your doctor, drink an ORS:  Make an ORS by using instructions on the package.  Start by drinking small amounts, about  cup (120 mL) every 5-10 minutes.  Slowly drink more until you have had the amount that your doctor said to have.  Drink enough clear fluid to keep your pee clear or pale yellow. If you were told to drink an ORS, finish the ORS first, then start slowly drinking clear fluids. Drink fluids such as:  Water. Do not drink only water by itself. Doing that can make the salt (sodium) level in your body get too low (hyponatremia).  Ice chips.  Fruit juice that you have added water to (diluted).  Low-calorie sports drinks.  Avoid:  Alcohol.  Drinks that have a lot of sugar. These include high-calorie sports drinks, fruit juice that  does not have water added, and soda.  Caffeine.  Foods that are greasy or have a lot of fat or sugar.  Take over-the-counter and prescription medicines only as told by your doctor.  Do not take salt tablets. Doing that can make the salt level in your body get too high (hypernatremia).  Eat foods that have minerals (electrolytes). Examples include bananas, oranges, potatoes, tomatoes, and spinach.  Keep all follow-up visits as told by your  doctor. This is important. Contact a doctor if:  You have belly (abdominal) pain that:  Gets worse.  Stays in one area (localizes).  You have a rash.  You have a stiff neck.  You get angry or annoyed more easily than normal (irritability).  You are more sleepy than normal.  You have a harder time waking up than normal.  You feel:  Weak.  Dizzy.  Very thirsty.  You have peed (urinated) only a small amount of very dark pee during 6-8 hours. Get help right away if:  You have symptoms of very bad dehydration.  You cannot drink fluids without throwing up (vomiting).  Your symptoms get worse with treatment.  You have a fever.  You have a very bad headache.  You are throwing up or having watery poop (diarrhea) and it:  Gets worse.  Does not go away.  You have blood or something green (bile) in your throw-up.  You have blood in your poop (stool). This may cause poop to look black and tarry.  You have not peed in 6-8 hours.  You pass out (faint).  Your heart rate when you are sitting still is more than 100 beats a minute.  You have trouble breathing. This information is not intended to replace advice given to you by your health care provider. Make sure you discuss any questions you have with your health care provider. Document Released: 09/21/2009 Document Revised: 06/14/2016 Document Reviewed: 01/19/2016 Elsevier Interactive Patient Education  2017 [employer] Zoledronic Acid injection (Hypercalcemia, Oncology) What is this medicine? ZOLEDRONIC ACID (ZOE le dron ik AS id) lowers the amount of calcium loss from bone. It is used to treat too much calcium in your blood from cancer. It is also used to prevent complications of cancer that has spread to the bone. This medicine may be used for other purposes; ask your health care provider or pharmacist if you have questions. COMMON BRAND NAME(S): Zometa What should I tell my health care provider before I take this  medicine? They need to know if you have any of these conditions: -aspirin-sensitive asthma -cancer, especially if you are receiving medicines used to treat cancer -dental disease or wear dentures -infection -kidney disease -receiving corticosteroids like dexamethasone or prednisone -an unusual or allergic reaction to zoledronic acid, other medicines, foods, dyes, or preservatives -pregnant or trying to get pregnant -breast-feeding How should I use this medicine? This medicine is for infusion into a vein. It is given by a health care professional in a hospital or clinic setting. Talk to your pediatrician regarding the use of this medicine in children. Special care may be needed. Overdosage: If you think you have taken too much of this medicine contact a poison control center or emergency room at once. NOTE: This medicine is only for you. Do not share this medicine with others. What if I miss a dose? It is important not to miss your dose. Call your doctor or health care professional if you are unable to keep an appointment. What may interact with  this medicine? -certain antibiotics given by injection -NSAIDs, medicines for pain and inflammation, like ibuprofen or naproxen -some diuretics like bumetanide, furosemide -teriparatide -thalidomide This list may not describe all possible interactions. Give your health care provider a list of all the medicines, herbs, non-prescription drugs, or dietary supplements you use. Also tell them if you smoke, drink alcohol, or use illegal drugs. Some items may interact with your medicine. What should I watch for while using this medicine? Visit your doctor or health care professional for regular checkups. It may be some time before you see the benefit from this medicine. Do not stop taking your medicine unless your doctor tells you to. Your doctor may order blood tests or other tests to see how you are doing. Women should inform their doctor if they wish to  become pregnant or think they might be pregnant. There is a potential for serious side effects to an unborn child. Talk to your health care professional or pharmacist for more information. You should make sure that you get enough calcium and vitamin D while you are taking this medicine. Discuss the foods you eat and the vitamins you take with your health care professional. Some people who take this medicine have severe bone, joint, and/or muscle pain. This medicine may also increase your risk for jaw problems or a broken thigh bone. Tell your doctor right away if you have severe pain in your jaw, bones, joints, or muscles. Tell your doctor if you have any pain that does not go away or that gets worse. Tell your dentist and dental surgeon that you are taking this medicine. You should not have major dental surgery while on this medicine. See your dentist to have a dental exam and fix any dental problems before starting this medicine. Take good care of your teeth while on this medicine. Make sure you see your dentist for regular follow-up appointments. What side effects may I notice from receiving this medicine? Side effects that you should report to your doctor or health care professional as soon as possible: -allergic reactions like skin rash, itching or hives, swelling of the face, lips, or tongue -anxiety, confusion, or depression -breathing problems -changes in vision -eye pain -feeling faint or lightheaded, falls -jaw pain, especially after dental work -mouth sores -muscle cramps, stiffness, or weakness -redness, blistering, peeling or loosening of the skin, including inside the mouth -trouble passing urine or change in the amount of urine Side effects that usually do not require medical attention (report to your doctor or health care professional if they continue or are bothersome): -bone, joint, or muscle pain -constipation -diarrhea -fever -hair loss -irritation at site where  injected -loss of appetite -nausea, vomiting -stomach upset -trouble sleeping -trouble swallowing -weak or tired This list may not describe all possible side effects. Call your doctor for medical advice about side effects. You may report side effects to FDA at 1-800-FDA-1088. Where should I keep my medicine? This drug is given in a hospital or clinic and will not be stored at home. NOTE: This sheet is a summary. It may not cover all possible information. If you have questions about this medicine, talk to your doctor, pharmacist, or health care provider.  2017 Elsevier/Gold Standard (2014-04-23 14:19:39)

## 2017-01-23 NOTE — H&P (Signed)
Chief Complaint: hepatocellular carcinoma  Referring Physician: Dr. Dominica Severin B. Sherrill  Supervising Physician: Arne Cleveland  Patient Status: [facility] - Out-pt  HPI: Justin [name] is an 56 y.o. male who is known to Dr. Vernard Gambles for right lobed hepatocellular carcinoma.  He has undergone his Pre Y90 roadmapping 2 weeks ago.  However, just this past Sunday, the patient started feeling worse with lower abdominal pain.  Both sides hurt equally as bad.  Eating makes his pain worse.  He then began to have 2 episodes of emesis on Tuesday.  He has been taking antacids, which also make his pain worse.  One of those being peptobismol and he does state his stool turned black; however, he card tested this and it was indeed fecal occult negative for blood.  He presents today for his actually Y-90 radio embolization procedure.  Upon arrival today his TB has increase to 1.6 from 0.4 two weeks ago, his WBC is 18K, and he is tachycardic in the 120s.  Past Medical History:  Past Medical History:  Diagnosis Date  . Cancer (Early)   . Chronic hepatitis B virus infection ([place]) 02/15/2004   Annotation: 02/15/2004:  +Core Ab, +SAg, -SAb, -Be Ag with elevated liver  enzymes HBV DNA via PCR:  <2.3 AFP normal Evaluated by [facility] 07/24/2004--Dr. Patsy [place] Qualifier: Diagnosis of  By: Amil Amen MD, Benjamine Mola    . Diabetes mellitus without complication (HCC)    no meds  . High cholesterol 2006  . Hyperlipidemia   . Hypertension   . Prediabetes 08/31/2016   Lab Results Component Value Date  HGBA1C 6.2 (H) 08/30/2015      Past Surgical History:  Past Surgical History:  Procedure Laterality Date  . EUS N/A 12/26/2016   Procedure: UPPER ENDOSCOPIC ULTRASOUND (EUS) LINEAR;  Surgeon: Milus Banister, MD;  Location: [facility] ENDOSCOPY;  Service: Endoscopy;  Laterality: N/A;  . FINE NEEDLE ASPIRATION N/A 12/26/2016   Procedure: FINE NEEDLE ASPIRATION (FNA) LINEAR;  Surgeon: Milus Banister, MD;  Location: [facility] ENDOSCOPY;   Service: Endoscopy;  Laterality: N/A;  . IR GENERIC HISTORICAL  12/19/2016   IR RADIOLOGIST EVAL & MGMT 12/19/2016 Arne Cleveland, MD [facility] INTERV RAD  . IR GENERIC HISTORICAL  01/09/2017   IR ANGIOGRAM SELECTIVE EACH ADDITIONAL VESSEL 01/09/2017 Arne Cleveland, MD [facility]  . IR GENERIC HISTORICAL  01/09/2017   IR ANGIOGRAM VISCERAL SELECTIVE 01/09/2017 Arne Cleveland, MD [facility]  . IR GENERIC HISTORICAL  01/09/2017   IR ANGIOGRAM SELECTIVE EACH ADDITIONAL VESSEL 01/09/2017 Arne Cleveland, MD [facility]  . IR GENERIC HISTORICAL  01/09/2017   IR ANGIOGRAM VISCERAL SELECTIVE 01/09/2017 Arne Cleveland, MD [facility]  . IR GENERIC HISTORICAL  01/09/2017   IR ANGIOGRAM SELECTIVE EACH ADDITIONAL VESSEL 01/09/2017 Arne Cleveland, MD [facility]  . IR GENERIC HISTORICAL  01/09/2017   IR US GUIDE VASC ACCESS RIGHT 01/09/2017 Arne Cleveland, MD [facility]INTER[facility]  . IR GENERIC HISTORICAL  01/09/2017   IR EMBO ARTERIAL NOT HEMORR HEMANG INC GUIDE ROADMAPPING 01/09/2017 Arne Cleveland, MD [facility]INTER[facility] RAD    Family History:  Family History  Problem Relation Age of Onset  . Heart disease Father   . Hyperlipidemia Father     Social History:  reports that he has never smoked. He has never used smokeless tobacco. He reports that he does not drink alcohol or use drugs.  Allergies:  Allergies  Allergen Reactions  . Lisinopril Other (See Comments)    coughing    Medications: Medications reviewed  in epic.  Please HPI for pertinent positives, otherwise complete 10 system ROS negative.  Mallampati Score: MD Evaluation Airway: WNL Heart: Other (comments) Heart  comments: tachy Abdomen: WNL Chest/ Lungs: WNL ASA  Classification: 3 Mallampati/Airway Score: Two  Physical Exam: BP (!) 123/99   Pulse (!) 123   Temp 98.5 F (36.9 C) (Oral)   Resp 18   SpO2 98%  There is no height or weight on file to calculate BMI. General: pleasant, WD male who is laying in bed in NAD, but does appear tired HEENT: head  is normocephalic, atraumatic.  Sclera are noninjected.  PERRL.  Ears and nose without any masses or lesions.  Mouth is pink and moist Heart: regular rhythm, but tachy.  Normal s1,s2. No obvious murmurs, gallops, or rubs noted.  Palpable radial and pedal pulses bilaterally Lungs: CTAB, no wheezes, rhonchi, or rales noted.  Respiratory effort nonlabored Abd: soft, mildly tender across the lower abdomen, ND, +BS, no masses, hernias, or organomegaly Psych: A&Ox3 with an appropriate affect.   Labs: Results for orders placed or performed during the hospital encounter of 01/23/17 (from the past 48 hour(s))  APTT upon arrival     Status: None   Collection Time: 01/23/17  7:58 AM  Result Value Ref Range   aPTT 36 24 - 36 seconds  CBC upon arrival     Status: Abnormal   Collection Time: 01/23/17  7:58 AM  Result Value Ref Range   WBC 18.0 (H) 4.0 - 10.5 K/uL   RBC 4.97 4.22 - 5.81 MIL/uL   Hemoglobin 12.7 (L) 13.0 - 17.0 g/dL   HCT 37.9 (L) 39.0 - 52.0 %   MCV 76.3 (L) 78.0 - 100.0 fL   MCH 25.6 (L) 26.0 - 34.0 pg   MCHC 33.5 30.0 - 36.0 g/dL   RDW 14.7 11.5 - 15.5 %   Platelets 352 150 - 400 K/uL  Protime-INR upon arrival     Status: None   Collection Time: 01/23/17  7:58 AM  Result Value Ref Range   Prothrombin Time 14.8 11.4 - 15.2 seconds   INR 1.16   Comprehensive metabolic panel     Status: Abnormal   Collection Time: 01/23/17  7:58 AM  Result Value Ref Range   Sodium 138 135 - 145 mmol/L   Potassium 3.2 (L) 3.5 - 5.1 mmol/L   Chloride 99 (L) 101 - 111 mmol/L   CO2 28 22 - 32 mmol/L   Glucose, Bld 147 (H) 65 - 99 mg/dL   BUN 14 6 - 20 mg/dL   Creatinine, Ser 0.91 0.61 - 1.24 mg/dL   Calcium 12.3 (H) 8.9 - 10.3 mg/dL   Total Protein 8.9 (H) 6.5 - 8.1 g/dL   Albumin 3.5 3.5 - 5.0 g/dL   AST 28 15 - 41 U/L   ALT 21 17 - 63 U/L   Alkaline Phosphatase 108 38 - 126 U/L   Total Bilirubin 1.6 (H) 0.3 - 1.2 mg/dL   GFR calc non Af Amer >60 >60 mL/min   GFR calc Af Amer >60 >60  mL/min    Comment: (NOTE) The eGFR has been calculated using the CKD EPI equation. This calculation has not been validated in all clinical situations. eGFR's persistently <60 mL/min signify possible Chronic Kidney Disease.    Anion gap 11 5 - 15    Imaging: No results found.  Assessment/Plan 1. Hepatocellular carcinoma The patient presents today for his Y-90 radioembolization treatment; however, upon arrival the patient has  been noted to have a new elevation of his bilirubin, elevation of his WBC, tachycardia, and new onset abdominal pain with occasional emesis.  Dr. Vernard Gambles and I have discussed this case and we will proceed with a CT of his abdomen and pelvis to evaluate these new findings prior to proceeding with his treatment.  This has been discussed with the patient and his family members who are present.  They all understand and agree with this plan.  ADDENDUM: CT scan reveals significant progression of his disease.  Dr. Vernard Gambles has reviewed his CT scan and discussed these results with the patient and his family.  He has also discussed this with Dr. Benay Spice and he will contact the patient to get an appointment soon to discuss treatment options. The Y-90 procedure will be cancelled at this time as he will need systemic therapy and not just focal, localized treatment.  Thank you for this interesting consult.  I greatly enjoyed meeting Justin [name] and look forward to participating in their care.  A copy of this report was sent to the requesting provider on this date.  Electronically Signed: Henreitta Cea 01/23/2017, 9:12 AM   I spent a total of    40 Minutes in face to face in clinical consultation, greater than 50% of which was counseling/coordinating care for hepatocellular carcinoma

## 2017-01-23 NOTE — Progress Notes (Signed)
START OFF PATHWAY REGIMEN - [Other Dx]  GemOx [id number] (T-cell Lymphoma)  [id number]:GemOx q14days (T-cell Lymphoma):   A cycle is every 14 days:     Gemcitabine (Gemzar(R)) 1000 mg/m2 in 250 mL NS IV over 30 minutes day 1 Dose Mod: None     Oxaliplatin (Eloxatin(R)) 100 mg/m2 in 250 mL D5W IV over 2 hours day 1 Dose Mod: None Additional Orders: Cycle = q14 days for 6-8 cycles.  Lab parameters before treatment:  Contact physician if absolute neutrophil count < 1000 or platelet count < 100.  Eugenio Hoes et al. study dosed regimen q21days if parameters not met.  **Always confirm dose/schedule in your pharmacy ordering system**    Intent of Therapy: Non-Curative / Palliative Intent, Discussed with Patient

## 2017-01-24 ENCOUNTER — Telehealth: Payer: Self-pay | Admitting: *Deleted

## 2017-01-24 ENCOUNTER — Telehealth: Payer: Self-pay | Admitting: Oncology

## 2017-01-24 ENCOUNTER — Other Ambulatory Visit: Payer: 59

## 2017-01-24 NOTE — Telephone Encounter (Signed)
Appointments scheduled per 2/15 LOS. Patient given AVS report and calendars with future scheduled appointments. °

## 2017-01-24 NOTE — Telephone Encounter (Signed)
Per 2/16 LOS and staff message I have scheduled appts. Notified the scheduelr

## 2017-01-24 NOTE — Telephone Encounter (Signed)
Called patient to inform him of next scheduled appointment for 2/20. LVM

## 2017-01-26 ENCOUNTER — Other Ambulatory Visit: Payer: Self-pay | Admitting: Oncology

## 2017-01-27 ENCOUNTER — Telehealth: Payer: Self-pay | Admitting: *Deleted

## 2017-01-27 ENCOUNTER — Telehealth: Payer: Self-pay | Admitting: Oncology

## 2017-01-27 NOTE — Telephone Encounter (Signed)
Received notification of insurance denial for Gem/Ox chemo. Peer-to-peer review by Dr. Benay Spice with Carleene Overlie MD, Shawn Stall. We will await appeal decision. Left message on voicemail informing pt we will need to reschedule infusion appts.

## 2017-01-27 NOTE — Telephone Encounter (Signed)
Faxed disability paperwork to  [place] fax [phone]

## 2017-01-27 NOTE — Telephone Encounter (Signed)
Received Disability paperwork to be completed

## 2017-01-28 ENCOUNTER — Other Ambulatory Visit: Payer: 59

## 2017-01-28 ENCOUNTER — Ambulatory Visit: Payer: 59

## 2017-01-28 NOTE — Telephone Encounter (Signed)
Pt returned call, informed him of insurance denial of chemo. Appeal has been submitted. Informed him we will call with new appointment when we hear from insurance company. Instructed pt to keep appt for port placement this week. He voiced understanding.

## 2017-01-29 ENCOUNTER — Telehealth: Payer: Self-pay

## 2017-01-29 ENCOUNTER — Other Ambulatory Visit: Payer: Self-pay | Admitting: Physician Assistant

## 2017-01-29 ENCOUNTER — Encounter: Payer: Self-pay | Admitting: Oncology

## 2017-01-29 ENCOUNTER — Other Ambulatory Visit: Payer: Self-pay | Admitting: Radiology

## 2017-01-29 NOTE — Telephone Encounter (Signed)
Pt called stating he called insurance company yesterday and the insurance company told him they fixed a problem with the denial and they have approved the medication. Pt is asking for a call back to schedule a new appt.

## 2017-01-30 ENCOUNTER — Other Ambulatory Visit: Payer: Self-pay | Admitting: Oncology

## 2017-01-30 ENCOUNTER — Ambulatory Visit (HOSPITAL_COMMUNITY)
Admission: RE | Admit: 2017-01-30 | Discharge: 2017-01-30 | Disposition: A | Discharge status: Home / Self Care | Payer: 59 | Source: Ambulatory Visit | Attending: Oncology | Admitting: Oncology

## 2017-01-30 ENCOUNTER — Telehealth: Payer: Self-pay | Admitting: *Deleted

## 2017-01-30 ENCOUNTER — Encounter (HOSPITAL_COMMUNITY): Payer: Self-pay

## 2017-01-30 DIAGNOSIS — C220 Liver cell carcinoma: Secondary | ICD-10-CM | POA: Diagnosis present

## 2017-01-30 DIAGNOSIS — Z7982 Long term (current) use of aspirin: Secondary | ICD-10-CM | POA: Diagnosis not present

## 2017-01-30 DIAGNOSIS — R000 Tachycardia, unspecified: Secondary | ICD-10-CM | POA: Insufficient documentation

## 2017-01-30 DIAGNOSIS — Z9221 Personal history of antineoplastic chemotherapy: Secondary | ICD-10-CM | POA: Diagnosis not present

## 2017-01-30 DIAGNOSIS — Z7189 Other specified counseling: Secondary | ICD-10-CM

## 2017-01-30 HISTORY — PX: IR GENERIC HISTORICAL: IMG1180011

## 2017-01-30 LAB — COMPREHENSIVE METABOLIC PANEL
ALK PHOS: 132 U/L — AB (ref 38–126)
ALT: 28 U/L (ref 17–63)
ANION GAP: 8 (ref 5–15)
AST: 33 U/L (ref 15–41)
Albumin: 3.2 g/dL — ABNORMAL LOW (ref 3.5–5.0)
BILIRUBIN TOTAL: 0.5 mg/dL (ref 0.3–1.2)
BUN: 10 mg/dL (ref 6–20)
CALCIUM: 9.2 mg/dL (ref 8.9–10.3)
CO2: 24 mmol/L (ref 22–32)
CREATININE: 0.79 mg/dL (ref 0.61–1.24)
Chloride: 107 mmol/L (ref 101–111)
Glucose, Bld: 118 mg/dL — ABNORMAL HIGH (ref 65–99)
Potassium: 3.3 mmol/L — ABNORMAL LOW (ref 3.5–5.1)
SODIUM: 139 mmol/L (ref 135–145)
TOTAL PROTEIN: 8 g/dL (ref 6.5–8.1)

## 2017-01-30 LAB — CBC WITH DIFFERENTIAL/PLATELET
Basophils Absolute: 0 10*3/uL (ref 0.0–0.1)
Basophils Relative: 0 %
EOS ABS: 0.3 10*3/uL (ref 0.0–0.7)
Eosinophils Relative: 2 %
HCT: 35.4 % — ABNORMAL LOW (ref 39.0–52.0)
HEMOGLOBIN: 11.6 g/dL — AB (ref 13.0–17.0)
LYMPHS ABS: 2.4 10*3/uL (ref 0.7–4.0)
LYMPHS PCT: 14 %
MCH: 24.7 pg — AB (ref 26.0–34.0)
MCHC: 32.8 g/dL (ref 30.0–36.0)
MCV: 75.5 fL — AB (ref 78.0–100.0)
MONOS PCT: 8 %
Monocytes Absolute: 1.4 10*3/uL — ABNORMAL HIGH (ref 0.1–1.0)
NEUTROS PCT: 76 %
Neutro Abs: 13.3 10*3/uL — ABNORMAL HIGH (ref 1.7–7.7)
Platelets: 365 10*3/uL (ref 150–400)
RBC: 4.69 MIL/uL (ref 4.22–5.81)
RDW: 15.1 % (ref 11.5–15.5)
WBC: 17.5 10*3/uL — AB (ref 4.0–10.5)

## 2017-01-30 LAB — PROTIME-INR
INR: 1.18
PROTHROMBIN TIME: 15.1 s (ref 11.4–15.2)

## 2017-01-30 MED ORDER — MIDAZOLAM HCL 2 MG/2ML IJ SOLN
INTRAMUSCULAR | Status: AC
  Filled 2017-01-30: qty 6

## 2017-01-30 MED ORDER — LIDOCAINE-EPINEPHRINE (PF) 2 %-1:200000 IJ SOLN
INTRAMUSCULAR | Status: AC
  Filled 2017-01-30: qty 20

## 2017-01-30 MED ORDER — HEPARIN SOD (PORK) LOCK FLUSH 100 UNIT/ML IV SOLN
INTRAVENOUS | Status: AC | PRN
  Administered 2017-01-30: 500 [IU]

## 2017-01-30 MED ORDER — LIDOCAINE-EPINEPHRINE (PF) 2 %-1:200000 IJ SOLN
INTRAMUSCULAR | Status: AC | PRN
  Administered 2017-01-30: 10 mL

## 2017-01-30 MED ORDER — CEFAZOLIN SODIUM-DEXTROSE 2-4 GM/100ML-% IV SOLN
2.0000 g | INTRAVENOUS | Status: AC
  Administered 2017-01-30: 2 g via INTRAVENOUS
  Filled 2017-01-30: qty 100

## 2017-01-30 MED ORDER — SODIUM CHLORIDE 0.9 % IV SOLN
INTRAVENOUS | Status: DC
  Administered 2017-01-30: 13:00:00 via INTRAVENOUS

## 2017-01-30 MED ORDER — LIDOCAINE HCL 1 % IJ SOLN
INTRAMUSCULAR | Status: AC
  Filled 2017-01-30: qty 20

## 2017-01-30 MED ORDER — FENTANYL CITRATE (PF) 100 MCG/2ML IJ SOLN
INTRAMUSCULAR | Status: AC
  Filled 2017-01-30: qty 4

## 2017-01-30 MED ORDER — HEPARIN SOD (PORK) LOCK FLUSH 100 UNIT/ML IV SOLN
INTRAVENOUS | Status: AC
  Filled 2017-01-30: qty 5

## 2017-01-30 MED ORDER — MIDAZOLAM HCL 2 MG/2ML IJ SOLN
INTRAMUSCULAR | Status: AC | PRN
  Administered 2017-01-30 (×3): 1 mg via INTRAVENOUS
  Administered 2017-01-30 (×2): 0.5 mg via INTRAVENOUS

## 2017-01-30 MED ORDER — FENTANYL CITRATE (PF) 100 MCG/2ML IJ SOLN
INTRAMUSCULAR | Status: AC | PRN
  Administered 2017-01-30: 25 ug via INTRAVENOUS
  Administered 2017-01-30: 50 ug via INTRAVENOUS
  Administered 2017-01-30 (×2): 25 ug via INTRAVENOUS

## 2017-01-30 MED ORDER — LIDOCAINE HCL 1 % IJ SOLN
INTRAMUSCULAR | Status: AC | PRN
  Administered 2017-01-30: 10 mL

## 2017-01-30 NOTE — Telephone Encounter (Signed)
Left message on voicemail informing pt that Dr. Benay Spice has arranged with oncology pharmacy director to begin treatment while we wait on insurance approval. Requested he call office to confirm appt for 2/23 @ 1:30.

## 2017-01-30 NOTE — H&P (Signed)
Referring Physician(s):  Dr. Betsy Coder  Supervising Physician: Markus Daft  Patient Status:  [facility]- outpt  Chief Complaint:  Hepatocellular carcinoma  Subjective: Justin [name] is a 56 year old male known to interventional radiology service for his history of hepatocellular carcinoma s/p Pre Y90 roadmapping who presented to IR department last week for his Y90 procedure, however patient was found to been clinically worse with increased pain, emesis, and elevated WBC. CT scan performed 01/24/16 showed significant progression of his disease.  At that time, his Y90 was cancelled and patient was to return to Oncology to discuss plans for systemic therapy.   Patient presents today for Port-A-Cath placement at the request of Dr. Betsy Coder.   Patient has been NPO.  He does not take blood thinners.  He has been in his usual state of health.   Allergies: Lisinopril  Medications: Prior to Admission medications   Medication Sig Start Date End Date Taking? Authorizing Provider  amLODipine (NORVASC) 10 MG tablet Take 1 tablet (10 mg total) by mouth daily. 01/16/17   Ladell Pier, MD  aspirin EC 81 MG tablet Take 1 tablet (81 mg total) by mouth daily. Patient taking differently: Take 81 mg by mouth daily after supper.  10/15/16   Shawnee Knapp, MD  calcium carbonate (TUMS - DOSED IN MG ELEMENTAL CALCIUM) 500 MG chewable tablet Chew 1 tablet by mouth daily as needed for indigestion or heartburn.    Historical Provider, MD  ferrous sulfate 325 (65 FE) MG tablet Take 325 mg by mouth 2 (two) times daily with a meal.    Historical Provider, MD  losartan (COZAAR) 100 MG tablet Take 1 tablet (100 mg total) by mouth daily. 12/23/16   Shawnee Knapp, MD  naproxen sodium (ANAPROX) 220 MG tablet Take 220 mg by mouth 2 (two) times daily as needed (for pain/fever/headache.).    Historical Provider, MD  ondansetron (ZOFRAN) 8 MG tablet Take 1 tablet (8 mg total) by mouth every 8 (eight) hours as needed for nausea or  vomiting. 01/23/17   Ladell Pier, MD  oxyCODONE-acetaminophen (PERCOCET/ROXICET) 5-325 MG tablet Take 1-2 tablets by mouth every 4 (four) hours as needed for severe pain. 01/23/17   Ladell Pier, MD  potassium chloride SA (K-DUR,KLOR-CON) 20 MEQ tablet Take 1 tablet (20 mEq total) by mouth daily. 01/23/17   Ladell Pier, MD  prochlorperazine (COMPAZINE) 10 MG tablet Take 1 tablet (10 mg total) by mouth every 6 (six) hours as needed for nausea or vomiting. 01/23/17   Ladell Pier, MD     Vital Signs: There were no vitals taken for this visit.  Physical Exam  Constitutional: He appears well-developed.  Cardiovascular: Regular rhythm and normal heart sounds.   Tachycardia  Pulmonary/Chest: Effort normal and breath sounds normal. No respiratory distress.  Abdominal: There is no tenderness.  Nursing note and vitals reviewed.   Imaging: No results found.  Labs:  CBC:  Recent Labs  12/07/16 0854 12/23/16 1340 01/09/17 0726 01/16/17 1350 01/23/17 0758  WBC 12.1* 7.9 11.1* 13.9* 18.0*  HGB 12.7*  --  12.6* 12.7* 12.7*  HCT 36.4* 40.1 38.5* 39.3 37.9*  PLT  --  406* 411* 385 352    COAGS:  Recent Labs  01/09/17 0726 01/23/17 0758  INR 1.03 1.16  APTT  --  36    BMP:  Recent Labs  12/07/16 0838 12/23/16 1340 01/09/17 0726 01/23/17 0758  NA 139 139 139 138  K  3.5 3.8 3.0* 3.2*  CL 97 95* 103 99*  CO2 24 27 25 28   GLUCOSE 221* 114* 133* 147*  BUN 9 9 10 14   CALCIUM 9.3 9.8 9.5 12.3*  CREATININE 0.89 0.83 0.81 0.91  GFRNONAA 95 97 >60 >60  GFRAA 110 112 >60 >60    LIVER FUNCTION TESTS:  Recent Labs  12/07/16 0838 12/23/16 1340 01/09/17 0726 01/23/17 0758  BILITOT 0.8 0.4 0.4 1.6*  AST 24 24 25 28   ALT 19 19 19 21   ALKPHOS 86 104 113 108  PROT 7.6 8.1 8.4* 8.9*  ALBUMIN 3.5 3.7 3.5 3.5    Assessment and Plan: Patient with hepatocellular carcinoma unable to undergo Y90 therapy due to disease progression presents for Port-A-Cath placement  at the request of Dr. Betsy Coder for systemic chemotherapy.  Patient presents for procedure today.  On exam, he is tachycardic.  He reports increased weakness, but states he has otherwise been healthy and without acute illness.  Patient reports fever after chemotherapy last week, but has since been afebrile.  Risks and Benefits discussed with the patient including, but not limited to bleeding, infection, pneumothorax, or fibrin sheath development and need for additional procedures. All of the patient's questions were answered, patient is agreeable to proceed. Consent signed and in chart. Discussed patient status with Dr. Anselm Pancoast.  Electronically Signed: Docia Barrier 01/30/2017, 12:46 PM   I spent a total of 15 Minutes at the the patient's bedside AND on the patient's hospital floor or unit, greater than 50% of which was counseling/coordinating care for hepatocellular carcinoma.

## 2017-01-30 NOTE — Sedation Documentation (Signed)
Patient is resting comfortably. 

## 2017-01-30 NOTE — Discharge Instructions (Signed)
Implanted Port Insertion, Care After °Refer to this sheet in the next few weeks. These instructions provide you with information on caring for yourself after your procedure. Your health care provider may also give you more specific instructions. Your treatment has been planned according to current medical practices, but problems sometimes occur. Call your health care provider if you have any problems or questions after your procedure. °WHAT TO EXPECT AFTER THE PROCEDURE °After your procedure, it is typical to have the following:  °· Discomfort at the port insertion site. Ice packs to the area will help. °· Bruising on the skin over the port. This will subside in 3-4 days. °HOME CARE INSTRUCTIONS °· After your port is placed, you will get a manufacturer's information card. The card has information about your port. Keep this card with you at all times.   °· Know what kind of port you have. There are many types of ports available.   °· Wear a medical alert bracelet in case of an emergency. This can help alert health care workers that you have a port.   °· The port can stay in for as long as your health care provider believes it is necessary.   °· A home health care nurse may give medicines and take care of the port.   °· You or a family member can get special training and directions for giving medicine and taking care of the port at home.   °SEEK MEDICAL CARE IF:  °· Your port does not flush or you are unable to get a blood return.   °· You have a fever or chills. °SEEK IMMEDIATE MEDICAL CARE IF: °· You have new fluid or pus coming from your incision.   °· You notice a bad smell coming from your incision site.   °· You have swelling, pain, or more redness at the incision or port site.   °· You have chest pain or shortness of breath. °This information is not intended to replace advice given to you by your health care provider. Make sure you discuss any questions you have with your health care provider. °Document  Released: 09/15/2013 Document Revised: 11/30/2013 Document Reviewed: 09/15/2013 °Elsevier Interactive Patient Education © 2017 [employer] ° °Moderate Conscious Sedation, Adult, Care After °These instructions provide you with information about caring for yourself after your procedure. Your health care provider may also give you more specific instructions. Your treatment has been planned according to current medical practices, but problems sometimes occur. Call your health care provider if you have any problems or questions after your procedure. °What can I expect after the procedure? °After your procedure, it is common: °· To feel sleepy for several hours. °· To feel clumsy and have poor balance for several hours. °· To have poor judgment for several hours. °· To vomit if you eat too soon. °Follow these instructions at home: °For at least 24 hours after the procedure:  °· Do not: °¨ Participate in activities where you could fall or become injured. °¨ Drive. °¨ Use heavy machinery. °¨ Drink alcohol. °¨ Take sleeping pills or medicines that cause drowsiness. °¨ Make important decisions or sign legal documents. °¨ Take care of children on your own. °· Rest. °Eating and drinking °· Follow the diet recommended by your health care provider. °· If you vomit: °¨ Drink water, juice, or soup when you can drink without vomiting. °¨ Make sure you have little or no nausea before eating solid foods. °General instructions °· Have a responsible adult stay with you until you are awake and alert. °·   Take over-the-counter and prescription medicines only as told by your health care provider. °· If you smoke, do not smoke without supervision. °· Keep all follow-up visits as told by your health care provider. This is important. °Contact a health care provider if: °· You keep feeling nauseous or you keep vomiting. °· You feel light-headed. °· You develop a rash. °· You have a fever. °Get help right away if: °· You have trouble  breathing. °This information is not intended to replace advice given to you by your health care provider. Make sure you discuss any questions you have with your health care provider. °Document Released: 09/15/2013 Document Revised: 04/29/2016 Document Reviewed: 03/16/2016 °Elsevier Interactive Patient Education © 2017 [employer] Inc. ° °

## 2017-01-30 NOTE — Procedures (Signed)
Placement of right jugular portacath, tip at SVC/RA junction.  No immediate complication.  Minimal blood loss.  See full report in Imaging.

## 2017-01-31 ENCOUNTER — Other Ambulatory Visit: Payer: Self-pay | Admitting: Oncology

## 2017-01-31 ENCOUNTER — Ambulatory Visit (HOSPITAL_BASED_OUTPATIENT_CLINIC_OR_DEPARTMENT_OTHER): Payer: 59

## 2017-01-31 VITALS — BP 119/94 | HR 100 | Temp 98.5°F | Resp 20

## 2017-01-31 DIAGNOSIS — C220 Liver cell carcinoma: Secondary | ICD-10-CM | POA: Diagnosis not present

## 2017-01-31 DIAGNOSIS — Z5111 Encounter for antineoplastic chemotherapy: Secondary | ICD-10-CM | POA: Diagnosis not present

## 2017-01-31 MED ORDER — DEXAMETHASONE SODIUM PHOSPHATE 10 MG/ML IJ SOLN
10.0000 mg | Freq: Once | INTRAMUSCULAR | Status: AC
  Administered 2017-01-31: 10 mg via INTRAVENOUS

## 2017-01-31 MED ORDER — PALONOSETRON HCL INJECTION 0.25 MG/5ML
INTRAVENOUS | Status: AC
  Filled 2017-01-31: qty 5

## 2017-01-31 MED ORDER — DEXAMETHASONE SODIUM PHOSPHATE 10 MG/ML IJ SOLN
INTRAMUSCULAR | Status: AC
  Filled 2017-01-31: qty 1

## 2017-01-31 MED ORDER — SODIUM CHLORIDE 0.9% FLUSH
10.0000 mL | INTRAVENOUS | Status: DC | PRN
  Administered 2017-01-31: 10 mL
  Filled 2017-01-31: qty 10

## 2017-01-31 MED ORDER — DEXTROSE 5 % IV SOLN
Freq: Once | INTRAVENOUS | Status: AC
  Administered 2017-01-31: 16:00:00 via INTRAVENOUS

## 2017-01-31 MED ORDER — PALONOSETRON HCL INJECTION 0.25 MG/5ML
0.2500 mg | Freq: Once | INTRAVENOUS | Status: AC
  Administered 2017-01-31: 0.25 mg via INTRAVENOUS

## 2017-01-31 MED ORDER — ANTICOAGULANT SODIUM CITRATE 4% (200MG/5ML) IV SOLN
5.0000 mL | Freq: Once | Status: AC
  Administered 2017-01-31: 5 mL via INTRAVENOUS
  Filled 2017-01-31: qty 5

## 2017-01-31 MED ORDER — LIDOCAINE-PRILOCAINE 2.5-2.5 % EX CREA: TOPICAL_CREAM | CUTANEOUS | 0 refills | Status: AC

## 2017-01-31 MED ORDER — DEXTROSE 5 % IV SOLN
84.0000 mg/m2 | Freq: Once | INTRAVENOUS | Status: AC
  Administered 2017-01-31: 150 mg via INTRAVENOUS
  Filled 2017-01-31: qty 10

## 2017-01-31 MED ORDER — SODIUM CHLORIDE 0.9 % IV SOLN
800.0000 mg/m2 | Freq: Once | INTRAVENOUS | Status: AC
  Administered 2017-01-31: 1444 mg via INTRAVENOUS
  Filled 2017-01-31: qty 37.98

## 2017-01-31 MED ORDER — SODIUM CHLORIDE 0.9 % IV SOLN
Freq: Once | INTRAVENOUS | Status: AC
  Administered 2017-01-31: 15:00:00 via INTRAVENOUS

## 2017-01-31 MED ORDER — HEPARIN SOD (PORK) LOCK FLUSH 100 UNIT/ML IV SOLN
500.0000 [IU] | Freq: Once | INTRAVENOUS | Status: DC | PRN
  Filled 2017-01-31: qty 5

## 2017-01-31 NOTE — Patient Instructions (Signed)
[name] Discharge Instructions for Patients Receiving Chemotherapy  Today you received the following chemotherapy agents Gemzar and Oxaliplatin To help prevent nausea and vomiting after your treatment, we encourage you to take your nausea medication : Compazine as prescribed by your physician.  If you develop nausea and vomiting that is not controlled by your nausea medication, call the clinic.   BELOW ARE SYMPTOMS THAT SHOULD BE REPORTED IMMEDIATELY:  *FEVER GREATER THAN 100.5 F  *CHILLS WITH OR WITHOUT FEVER  NAUSEA AND VOMITING THAT IS NOT CONTROLLED WITH YOUR NAUSEA MEDICATION  *UNUSUAL SHORTNESS OF BREATH  *UNUSUAL BRUISING OR BLEEDING  TENDERNESS IN MOUTH AND THROAT WITH OR WITHOUT PRESENCE OF ULCERS  *URINARY PROBLEMS  *BOWEL PROBLEMS  UNUSUAL RASH Items with * indicate a potential emergency and should be followed up as soon as possible.  Feel free to call the clinic you have any questions or concerns. The clinic phone number is [phone].  Please show the [facility] at check-in to the Emergency Department and triage nurse.  Gemcitabine injection What is this medicine? GEMCITABINE (jem SIT a been) is a chemotherapy drug. This medicine is used to treat many types of cancer like breast cancer, lung cancer, pancreatic cancer, and ovarian cancer. This medicine may be used for other purposes; ask your health care provider or pharmacist if you have questions. COMMON BRAND NAME(S): Gemzar What should I tell my health care provider before I take this medicine? They need to know if you have any of these conditions: -blood disorders -infection -kidney disease -liver disease -recent or ongoing radiation therapy -an unusual or allergic reaction to gemcitabine, other chemotherapy, other medicines, foods, dyes, or preservatives -pregnant or trying to get pregnant -breast-feeding How should I use this medicine? This drug is given as an infusion  into a vein. It is administered in a hospital or clinic by a specially trained health care professional. Talk to your pediatrician regarding the use of this medicine in children. Special care may be needed. Overdosage: If you think you have taken too much of this medicine contact a poison control center or emergency room at once. NOTE: This medicine is only for you. Do not share this medicine with others. What if I miss a dose? It is important not to miss your dose. Call your doctor or health care professional if you are unable to keep an appointment. What may interact with this medicine? -medicines to increase blood counts like filgrastim, pegfilgrastim, sargramostim -some other chemotherapy drugs like cisplatin -vaccines Talk to your doctor or health care professional before taking any of these medicines: -acetaminophen -aspirin -ibuprofen -ketoprofen -naproxen This list may not describe all possible interactions. Give your health care provider a list of all the medicines, herbs, non-prescription drugs, or dietary supplements you use. Also tell them if you smoke, drink alcohol, or use illegal drugs. Some items may interact with your medicine. What should I watch for while using this medicine? Visit your doctor for checks on your progress. This drug may make you feel generally unwell. This is not uncommon, as chemotherapy can affect healthy cells as well as cancer cells. Report any side effects. Continue your course of treatment even though you feel ill unless your doctor tells you to stop. In some cases, you may be given additional medicines to help with side effects. Follow all directions for their use. Call your doctor or health care professional for advice if you get a fever, chills or sore throat, or other symptoms  of a cold or flu. Do not treat yourself. This drug decreases your body's ability to fight infections. Try to avoid being around people who are sick. This medicine may increase  your risk to bruise or bleed. Call your doctor or health care professional if you notice any unusual bleeding. Be careful brushing and flossing your teeth or using a toothpick because you may get an infection or bleed more easily. If you have any dental work done, tell your dentist you are receiving this medicine. Avoid taking products that contain aspirin, acetaminophen, ibuprofen, naproxen, or ketoprofen unless instructed by your doctor. These medicines may hide a fever. Women should inform their doctor if they wish to become pregnant or think they might be pregnant. There is a potential for serious side effects to an unborn child. Talk to your health care professional or pharmacist for more information. Do not breast-feed an infant while taking this medicine. What side effects may I notice from receiving this medicine? Side effects that you should report to your doctor or health care professional as soon as possible: -allergic reactions like skin rash, itching or hives, swelling of the face, lips, or tongue -low blood counts - this medicine may decrease the number of white blood cells, red blood cells and platelets. You may be at increased risk for infections and bleeding. -signs of infection - fever or chills, cough, sore throat, pain or difficulty passing urine -signs of decreased platelets or bleeding - bruising, pinpoint red spots on the skin, black, tarry stools, blood in the urine -signs of decreased red blood cells - unusually weak or tired, fainting spells, lightheadedness -breathing problems -chest pain -mouth sores -nausea and vomiting -pain, swelling, redness at site where injected -pain, tingling, numbness in the hands or feet -stomach pain -swelling of ankles, feet, hands -unusual bleeding Side effects that usually do not require medical attention (report to your doctor or health care professional if they continue or are bothersome): -constipation -diarrhea -hair loss -loss of  appetite -stomach upset This list may not describe all possible side effects. Call your doctor for medical advice about side effects. You may report side effects to FDA at 1-800-FDA-1088. Where should I keep my medicine? This drug is given in a hospital or clinic and will not be stored at home. NOTE: This sheet is a summary. It may not cover all possible information. If you have questions about this medicine, talk to your doctor, pharmacist, or health care provider.  2017 Elsevier/Gold Standard (2008-04-05 18:45:54)  Oxaliplatin Injection What is this medicine? OXALIPLATIN (ox AL i PLA tin) is a chemotherapy drug. It targets fast dividing cells, like cancer cells, and causes these cells to die. This medicine is used to treat cancers of the colon and rectum, and many other cancers. This medicine may be used for other purposes; ask your health care provider or pharmacist if you have questions. COMMON BRAND NAME(S): Eloxatin What should I tell my health care provider before I take this medicine? They need to know if you have any of these conditions: -kidney disease -an unusual or allergic reaction to oxaliplatin, other chemotherapy, other medicines, foods, dyes, or preservatives -pregnant or trying to get pregnant -breast-feeding How should I use this medicine? This drug is given as an infusion into a vein. It is administered in a hospital or clinic by a specially trained health care professional. Talk to your pediatrician regarding the use of this medicine in children. Special care may be needed. Overdosage: If you think you  have taken too much of this medicine contact a poison control center or emergency room at once. NOTE: This medicine is only for you. Do not share this medicine with others. What if I miss a dose? It is important not to miss a dose. Call your doctor or health care professional if you are unable to keep an appointment. What may interact with this medicine? -medicines to  increase blood counts like filgrastim, pegfilgrastim, sargramostim -probenecid -some antibiotics like amikacin, gentamicin, neomycin, polymyxin B, streptomycin, tobramycin -zalcitabine Talk to your doctor or health care professional before taking any of these medicines: -acetaminophen -aspirin -ibuprofen -ketoprofen -naproxen This list may not describe all possible interactions. Give your health care provider a list of all the medicines, herbs, non-prescription drugs, or dietary supplements you use. Also tell them if you smoke, drink alcohol, or use illegal drugs. Some items may interact with your medicine. What should I watch for while using this medicine? Your condition will be monitored carefully while you are receiving this medicine. You will need important blood work done while you are taking this medicine. This medicine can make you more sensitive to cold. Do not drink cold drinks or use ice. Cover exposed skin before coming in contact with cold temperatures or cold objects. When out in cold weather wear warm clothing and cover your mouth and nose to warm the air that goes into your lungs. Tell your doctor if you get sensitive to the cold. This drug may make you feel generally unwell. This is not uncommon, as chemotherapy can affect healthy cells as well as cancer cells. Report any side effects. Continue your course of treatment even though you feel ill unless your doctor tells you to stop. In some cases, you may be given additional medicines to help with side effects. Follow all directions for their use. Call your doctor or health care professional for advice if you get a fever, chills or sore throat, or other symptoms of a cold or flu. Do not treat yourself. This drug decreases your body's ability to fight infections. Try to avoid being around people who are sick. This medicine may increase your risk to bruise or bleed. Call your doctor or health care professional if you notice any unusual  bleeding. Be careful brushing and flossing your teeth or using a toothpick because you may get an infection or bleed more easily. If you have any dental work done, tell your dentist you are receiving this medicine. Avoid taking products that contain aspirin, acetaminophen, ibuprofen, naproxen, or ketoprofen unless instructed by your doctor. These medicines may hide a fever. Do not become pregnant while taking this medicine. Women should inform their doctor if they wish to become pregnant or think they might be pregnant. There is a potential for serious side effects to an unborn child. Talk to your health care professional or pharmacist for more information. Do not breast-feed an infant while taking this medicine. Call your doctor or health care professional if you get diarrhea. Do not treat yourself. What side effects may I notice from receiving this medicine? Side effects that you should report to your doctor or health care professional as soon as possible: -allergic reactions like skin rash, itching or hives, swelling of the face, lips, or tongue -low blood counts - This drug may decrease the number of white blood cells, red blood cells and platelets. You may be at increased risk for infections and bleeding. -signs of infection - fever or chills, cough, sore throat, pain or difficulty  passing urine -signs of decreased platelets or bleeding - bruising, pinpoint red spots on the skin, black, tarry stools, nosebleeds -signs of decreased red blood cells - unusually weak or tired, fainting spells, lightheadedness -breathing problems -chest pain, pressure -cough -diarrhea -jaw tightness -mouth sores -nausea and vomiting -pain, swelling, redness or irritation at the injection site -pain, tingling, numbness in the hands or feet -problems with balance, talking, walking -redness, blistering, peeling or loosening of the skin, including inside the mouth -trouble passing urine or change in the amount of  urine Side effects that usually do not require medical attention (report to your doctor or health care professional if they continue or are bothersome): -changes in vision -constipation -hair loss -loss of appetite -metallic taste in the mouth or changes in taste -stomach pain This list may not describe all possible side effects. Call your doctor for medical advice about side effects. You may report side effects to FDA at 1-800-FDA-1088. Where should I keep my medicine? This drug is given in a hospital or clinic and will not be stored at home. NOTE: This sheet is a summary. It may not cover all possible information. If you have questions about this medicine, talk to your doctor, pharmacist, or health care provider.  2017 Elsevier/Gold Standard (2008-06-21 17:22:47)

## 2017-02-04 ENCOUNTER — Ambulatory Visit (HOSPITAL_COMMUNITY)
Admission: RE | Admit: 2017-02-04 | Discharge: 2017-02-04 | Disposition: A | Discharge status: Home / Self Care | Payer: 59 | Source: Ambulatory Visit | Attending: Nurse Practitioner | Admitting: Nurse Practitioner

## 2017-02-04 ENCOUNTER — Telehealth: Payer: Self-pay | Admitting: *Deleted

## 2017-02-04 ENCOUNTER — Ambulatory Visit (HOSPITAL_BASED_OUTPATIENT_CLINIC_OR_DEPARTMENT_OTHER): Payer: 59 | Admitting: Nurse Practitioner

## 2017-02-04 ENCOUNTER — Other Ambulatory Visit: Payer: 59

## 2017-02-04 ENCOUNTER — Other Ambulatory Visit: Payer: Self-pay

## 2017-02-04 ENCOUNTER — Other Ambulatory Visit: Payer: Self-pay | Admitting: Nurse Practitioner

## 2017-02-04 VITALS — BP 116/89 | HR 100 | Temp 99.0°F | Resp 19 | Wt 152.0 lb

## 2017-02-04 DIAGNOSIS — C220 Liver cell carcinoma: Secondary | ICD-10-CM

## 2017-02-04 DIAGNOSIS — M545 Low back pain: Secondary | ICD-10-CM

## 2017-02-04 DIAGNOSIS — M5126 Other intervertebral disc displacement, lumbar region: Secondary | ICD-10-CM | POA: Diagnosis not present

## 2017-02-04 DIAGNOSIS — R590 Localized enlarged lymph nodes: Secondary | ICD-10-CM | POA: Diagnosis not present

## 2017-02-04 DIAGNOSIS — M21372 Foot drop, left foot: Secondary | ICD-10-CM

## 2017-02-04 DIAGNOSIS — M5127 Other intervertebral disc displacement, lumbosacral region: Secondary | ICD-10-CM | POA: Diagnosis not present

## 2017-02-04 DIAGNOSIS — R05 Cough: Secondary | ICD-10-CM

## 2017-02-04 DIAGNOSIS — M5136 Other intervertebral disc degeneration, lumbar region: Secondary | ICD-10-CM | POA: Diagnosis not present

## 2017-02-04 DIAGNOSIS — R2231 Localized swelling, mass and lump, right upper limb: Secondary | ICD-10-CM | POA: Diagnosis not present

## 2017-02-04 DIAGNOSIS — B181 Chronic viral hepatitis B without delta-agent: Secondary | ICD-10-CM | POA: Diagnosis not present

## 2017-02-04 DIAGNOSIS — M48061 Spinal stenosis, lumbar region without neurogenic claudication: Secondary | ICD-10-CM | POA: Diagnosis not present

## 2017-02-04 DIAGNOSIS — I10 Essential (primary) hypertension: Secondary | ICD-10-CM | POA: Diagnosis not present

## 2017-02-04 MED ORDER — METHYLPREDNISOLONE 4 MG PO TBPK: ORAL_TABLET | ORAL | 0 refills | Status: DC

## 2017-02-04 NOTE — Progress Notes (Addendum)
  [facility] OFFICE PROGRESS NOTE   Diagnosis:  Hepatocellular carcinoma  INTERVAL HISTORY:   Mr. Gathright returns as scheduled. He completed cycle 1 gemcitabine/oxaliplatin 01/31/2017. He had mild nausea and one episode of emesis. No mouth sores. No diarrhea. He feels weak in general. He noted weakness of the left foot a few days ago. He also noted onset of low back pain radiating to the left hip a few days ago. No bowel or bladder dysfunction. He has noted development of a left neck lymph node and a lump on the right forearm. He continues to cough.  Objective:  Vital signs in last 24 hours:  Blood pressure 116/89, pulse 100, temperature 99 F (37.2 C), temperature source Oral, resp. rate 19, weight 152 lb (68.9 kg), SpO2 100 %.    HEENT: No thrush or ulcers. Lymphatics: High left anterior cervical lymph node. Resp: Lungs clear bilaterally. Cardio: Regular rate and rhythm, tachycardia. GI: No mass. No hepatomegaly. Vascular: No leg edema. Neuro: Alert and oriented. Left foot drop. Leg strength is otherwise intact. Musculoskeletal: Approximate 2 cm cutaneous masslike lesion right forearm.  Port-A-Cath without erythema.    Lab Results:  Lab Results  Component Value Date   WBC 17.5 (H) 01/30/2017   HGB 11.6 (L) 01/30/2017   HCT 35.4 (L) 01/30/2017   MCV 75.5 (L) 01/30/2017   PLT 365 01/30/2017   NEUTROABS 13.3 (H) 01/30/2017    Imaging:  No results found.  Medications: I have reviewed the patient's current medications.  Assessment/Plan: 1. Hepatocellular carcinoma ? MRI abdomen 12/12/2016 consistent with a right hepatic HCC measuring 12.3 x 12.7 cm ? Markedly elevated AFP ? History of hepatitis B ? CTs chest, abdomen, and pelvis 12/17/2016-large right liver mass involving segments 7 and 8, possible involvement of the left liver. Nonspecific right lung nodules, nonspecific porta hepatis nodes, 1.5 cm right lower paraesophageal mediastinal node ? EUS  biopsy of the paraesophageal lymph node on 12/26/2016 confirms metastatic hepatocellular carcinoma  ? CT abdomen and pelvis 01/23/2017 with marked progression of the right liver mass, hilar/esophageal lymph nodes, omentum, pancreas head, and subcutaneous nodules ? Cycle 1 gemcitabine/oxaliplatin 01/31/2017  2. Chronic hepatitis B infection  3. Fever  4. Nonproductive cough  5. Anorexia/weight loss  6. Microcytic anemia-likely iron deficiency as the red cell microcytosis is new. Stool Hemoccult cards negative on 01/22/2017  7. Hypertension-amlodipine dose increased to 10 mg 01/16/2017  8.  Hypercalcemia of malignancy 01/23/2017-Zometa given 01/23/2017    Disposition: Mr. Rauf has completed 1 cycle of gemcitabine/oxaliplatin. He is scheduled for cycle 2 on 02/14/2017.  He understands the right forearm mass and left anterior cervical adenopathy are most likely related to the cancer.  He has developed radicular low back pain and a left foot drop. We are referring him for an MRI.  He will return for a follow-up visit on 02/14/2017. We will adjust that appointment accordingly pending the MRI result.  Patient seen with Dr. Benay Spice. 25 minutes were spent face-to-face at today's visit with the majority of that time involved in counseling/coordination of care.    Ned Card ANP/GNP-BC   02/04/2017  11:57 AM  This was a shared visit with Ned Card. Mr.Rossitto was interviewed and examined. He has developed left lower back pain and a left foot drop. He will be referred for an MRI of the lumbar spine.  Julieanne Manson, M.D.

## 2017-02-04 NOTE — Progress Notes (Signed)
Spoke with patient in lobby, informed him per NP MRI shows no cancer, possible disc problem, medrol dose pak to be sent to pharmacy- clarified preferred pharmacy with pt. Pt informed to take as directed. Pt verbalized all understanding and denies any questions or concerns at this time.

## 2017-02-11 ENCOUNTER — Encounter (HOSPITAL_COMMUNITY): Payer: Self-pay | Admitting: Emergency Medicine

## 2017-02-11 ENCOUNTER — Ambulatory Visit: Payer: 59

## 2017-02-11 ENCOUNTER — Inpatient Hospital Stay (HOSPITAL_COMMUNITY)
Admission: EM | Admit: 2017-02-11 | Discharge: 2017-02-14 | DRG: 175 | Disposition: A | Discharge status: Home / Self Care | Payer: 59 | Attending: Internal Medicine | Admitting: Internal Medicine

## 2017-02-11 ENCOUNTER — Other Ambulatory Visit: Payer: 59

## 2017-02-11 ENCOUNTER — Ambulatory Visit: Payer: 59 | Admitting: Nurse Practitioner

## 2017-02-11 DIAGNOSIS — I959 Hypotension, unspecified: Secondary | ICD-10-CM | POA: Diagnosis not present

## 2017-02-11 DIAGNOSIS — R0609 Other forms of dyspnea: Secondary | ICD-10-CM

## 2017-02-11 DIAGNOSIS — R0602 Shortness of breath: Secondary | ICD-10-CM | POA: Diagnosis not present

## 2017-02-11 DIAGNOSIS — R590 Localized enlarged lymph nodes: Secondary | ICD-10-CM | POA: Diagnosis present

## 2017-02-11 DIAGNOSIS — C228 Malignant neoplasm of liver, primary, unspecified as to type: Secondary | ICD-10-CM | POA: Diagnosis present

## 2017-02-11 DIAGNOSIS — R7989 Other specified abnormal findings of blood chemistry: Secondary | ICD-10-CM | POA: Diagnosis present

## 2017-02-11 DIAGNOSIS — I2609 Other pulmonary embolism with acute cor pulmonale: Secondary | ICD-10-CM | POA: Diagnosis not present

## 2017-02-11 DIAGNOSIS — I10 Essential (primary) hypertension: Secondary | ICD-10-CM | POA: Diagnosis present

## 2017-02-11 DIAGNOSIS — Z6823 Body mass index (BMI) 23.0-23.9, adult: Secondary | ICD-10-CM

## 2017-02-11 DIAGNOSIS — D72829 Elevated white blood cell count, unspecified: Secondary | ICD-10-CM | POA: Diagnosis present

## 2017-02-11 DIAGNOSIS — E876 Hypokalemia: Secondary | ICD-10-CM | POA: Diagnosis present

## 2017-02-11 DIAGNOSIS — R6881 Early satiety: Secondary | ICD-10-CM | POA: Diagnosis present

## 2017-02-11 DIAGNOSIS — C771 Secondary and unspecified malignant neoplasm of intrathoracic lymph nodes: Secondary | ICD-10-CM | POA: Diagnosis present

## 2017-02-11 DIAGNOSIS — E860 Dehydration: Secondary | ICD-10-CM | POA: Diagnosis present

## 2017-02-11 DIAGNOSIS — Z79899 Other long term (current) drug therapy: Secondary | ICD-10-CM

## 2017-02-11 DIAGNOSIS — E871 Hypo-osmolality and hyponatremia: Secondary | ICD-10-CM | POA: Diagnosis present

## 2017-02-11 DIAGNOSIS — R778 Other specified abnormalities of plasma proteins: Secondary | ICD-10-CM | POA: Diagnosis present

## 2017-02-11 DIAGNOSIS — C7801 Secondary malignant neoplasm of right lung: Secondary | ICD-10-CM | POA: Diagnosis present

## 2017-02-11 DIAGNOSIS — Z7982 Long term (current) use of aspirin: Secondary | ICD-10-CM

## 2017-02-11 DIAGNOSIS — C7802 Secondary malignant neoplasm of left lung: Secondary | ICD-10-CM | POA: Diagnosis present

## 2017-02-11 DIAGNOSIS — I248 Other forms of acute ischemic heart disease: Secondary | ICD-10-CM | POA: Diagnosis present

## 2017-02-11 DIAGNOSIS — E119 Type 2 diabetes mellitus without complications: Secondary | ICD-10-CM | POA: Diagnosis present

## 2017-02-11 DIAGNOSIS — D509 Iron deficiency anemia, unspecified: Secondary | ICD-10-CM | POA: Diagnosis present

## 2017-02-11 DIAGNOSIS — R7303 Prediabetes: Secondary | ICD-10-CM | POA: Diagnosis present

## 2017-02-11 DIAGNOSIS — C220 Liver cell carcinoma: Secondary | ICD-10-CM | POA: Diagnosis present

## 2017-02-11 DIAGNOSIS — I2699 Other pulmonary embolism without acute cor pulmonale: Secondary | ICD-10-CM

## 2017-02-11 DIAGNOSIS — E43 Unspecified severe protein-calorie malnutrition: Secondary | ICD-10-CM | POA: Insufficient documentation

## 2017-02-11 NOTE — ED Triage Notes (Signed)
Pt c/o generalized malaise and weakness for over 1 week tha has increased the last several days. Pt currently receiving chemo for liver CA and is a pt of Dr. Benay Spice

## 2017-02-12 ENCOUNTER — Other Ambulatory Visit: Payer: Self-pay

## 2017-02-12 ENCOUNTER — Inpatient Hospital Stay (HOSPITAL_COMMUNITY): Payer: 59

## 2017-02-12 ENCOUNTER — Other Ambulatory Visit (HOSPITAL_COMMUNITY): Payer: Self-pay

## 2017-02-12 ENCOUNTER — Emergency Department (HOSPITAL_COMMUNITY): Payer: 59

## 2017-02-12 ENCOUNTER — Encounter (HOSPITAL_COMMUNITY): Payer: Self-pay

## 2017-02-12 DIAGNOSIS — Z7982 Long term (current) use of aspirin: Secondary | ICD-10-CM | POA: Diagnosis not present

## 2017-02-12 DIAGNOSIS — E119 Type 2 diabetes mellitus without complications: Secondary | ICD-10-CM | POA: Diagnosis present

## 2017-02-12 DIAGNOSIS — R748 Abnormal levels of other serum enzymes: Secondary | ICD-10-CM

## 2017-02-12 DIAGNOSIS — C771 Secondary and unspecified malignant neoplasm of intrathoracic lymph nodes: Secondary | ICD-10-CM | POA: Diagnosis present

## 2017-02-12 DIAGNOSIS — I2699 Other pulmonary embolism without acute cor pulmonale: Secondary | ICD-10-CM

## 2017-02-12 DIAGNOSIS — R05 Cough: Secondary | ICD-10-CM | POA: Diagnosis not present

## 2017-02-12 DIAGNOSIS — D72829 Elevated white blood cell count, unspecified: Secondary | ICD-10-CM | POA: Diagnosis present

## 2017-02-12 DIAGNOSIS — R7303 Prediabetes: Secondary | ICD-10-CM | POA: Diagnosis not present

## 2017-02-12 DIAGNOSIS — Z6823 Body mass index (BMI) 23.0-23.9, adult: Secondary | ICD-10-CM | POA: Diagnosis not present

## 2017-02-12 DIAGNOSIS — B181 Chronic viral hepatitis B without delta-agent: Secondary | ICD-10-CM | POA: Diagnosis not present

## 2017-02-12 DIAGNOSIS — E43 Unspecified severe protein-calorie malnutrition: Secondary | ICD-10-CM | POA: Diagnosis present

## 2017-02-12 DIAGNOSIS — D509 Iron deficiency anemia, unspecified: Secondary | ICD-10-CM | POA: Diagnosis present

## 2017-02-12 DIAGNOSIS — E871 Hypo-osmolality and hyponatremia: Secondary | ICD-10-CM | POA: Diagnosis present

## 2017-02-12 DIAGNOSIS — E1310 Other specified diabetes mellitus with ketoacidosis without coma: Secondary | ICD-10-CM | POA: Diagnosis not present

## 2017-02-12 DIAGNOSIS — I248 Other forms of acute ischemic heart disease: Secondary | ICD-10-CM | POA: Diagnosis present

## 2017-02-12 DIAGNOSIS — R0602 Shortness of breath: Secondary | ICD-10-CM | POA: Diagnosis present

## 2017-02-12 DIAGNOSIS — E860 Dehydration: Secondary | ICD-10-CM | POA: Diagnosis present

## 2017-02-12 DIAGNOSIS — C7802 Secondary malignant neoplasm of left lung: Secondary | ICD-10-CM | POA: Diagnosis present

## 2017-02-12 DIAGNOSIS — I10 Essential (primary) hypertension: Secondary | ICD-10-CM | POA: Diagnosis present

## 2017-02-12 DIAGNOSIS — R229 Localized swelling, mass and lump, unspecified: Secondary | ICD-10-CM | POA: Diagnosis not present

## 2017-02-12 DIAGNOSIS — I2609 Other pulmonary embolism with acute cor pulmonale: Secondary | ICD-10-CM | POA: Diagnosis present

## 2017-02-12 DIAGNOSIS — C7801 Secondary malignant neoplasm of right lung: Secondary | ICD-10-CM | POA: Diagnosis present

## 2017-02-12 DIAGNOSIS — Z79899 Other long term (current) drug therapy: Secondary | ICD-10-CM | POA: Diagnosis not present

## 2017-02-12 DIAGNOSIS — R590 Localized enlarged lymph nodes: Secondary | ICD-10-CM | POA: Diagnosis not present

## 2017-02-12 DIAGNOSIS — R7989 Other specified abnormal findings of blood chemistry: Secondary | ICD-10-CM

## 2017-02-12 DIAGNOSIS — I959 Hypotension, unspecified: Secondary | ICD-10-CM | POA: Diagnosis not present

## 2017-02-12 DIAGNOSIS — R778 Other specified abnormalities of plasma proteins: Secondary | ICD-10-CM | POA: Diagnosis present

## 2017-02-12 DIAGNOSIS — E876 Hypokalemia: Secondary | ICD-10-CM | POA: Diagnosis present

## 2017-02-12 DIAGNOSIS — C228 Malignant neoplasm of liver, primary, unspecified as to type: Secondary | ICD-10-CM | POA: Diagnosis present

## 2017-02-12 DIAGNOSIS — R6881 Early satiety: Secondary | ICD-10-CM | POA: Diagnosis present

## 2017-02-12 DIAGNOSIS — C220 Liver cell carcinoma: Secondary | ICD-10-CM | POA: Diagnosis not present

## 2017-02-12 LAB — OSMOLALITY: OSMOLALITY: 287 mosm/kg (ref 275–295)

## 2017-02-12 LAB — CBC WITH DIFFERENTIAL/PLATELET
BASOS PCT: 0 %
Basophils Absolute: 0 10*3/uL (ref 0.0–0.1)
EOS PCT: 0 %
Eosinophils Absolute: 0.1 10*3/uL (ref 0.0–0.7)
HCT: 32 % — ABNORMAL LOW (ref 39.0–52.0)
Hemoglobin: 10.5 g/dL — ABNORMAL LOW (ref 13.0–17.0)
Lymphocytes Relative: 13 %
Lymphs Abs: 1.9 10*3/uL (ref 0.7–4.0)
MCH: 25.2 pg — ABNORMAL LOW (ref 26.0–34.0)
MCHC: 32.8 g/dL (ref 30.0–36.0)
MCV: 76.7 fL — ABNORMAL LOW (ref 78.0–100.0)
MONO ABS: 1.9 10*3/uL — AB (ref 0.1–1.0)
Monocytes Relative: 12 %
Neutro Abs: 11.4 10*3/uL — ABNORMAL HIGH (ref 1.7–7.7)
Neutrophils Relative %: 75 %
PLATELETS: 244 10*3/uL (ref 150–400)
RBC: 4.17 MIL/uL — ABNORMAL LOW (ref 4.22–5.81)
RDW: 16.2 % — AB (ref 11.5–15.5)
WBC: 15.3 10*3/uL — ABNORMAL HIGH (ref 4.0–10.5)

## 2017-02-12 LAB — CBC
HEMATOCRIT: 32 % — AB (ref 39.0–52.0)
HEMOGLOBIN: 10.6 g/dL — AB (ref 13.0–17.0)
MCH: 25.5 pg — AB (ref 26.0–34.0)
MCHC: 33.1 g/dL (ref 30.0–36.0)
MCV: 77.1 fL — AB (ref 78.0–100.0)
Platelets: 262 10*3/uL (ref 150–400)
RBC: 4.15 MIL/uL — AB (ref 4.22–5.81)
RDW: 16.3 % — ABNORMAL HIGH (ref 11.5–15.5)
WBC: 16 10*3/uL — ABNORMAL HIGH (ref 4.0–10.5)

## 2017-02-12 LAB — HEPARIN LEVEL (UNFRACTIONATED)
HEPARIN UNFRACTIONATED: 0.3 [IU]/mL (ref 0.30–0.70)
Heparin Unfractionated: <0.1 IU/mL — ABNORMAL LOW (ref 0.30–0.70)

## 2017-02-12 LAB — COMPREHENSIVE METABOLIC PANEL
ALK PHOS: 133 U/L — AB (ref 38–126)
ALT: 27 U/L (ref 17–63)
ANION GAP: 9 (ref 5–15)
AST: 34 U/L (ref 15–41)
Albumin: 2.6 g/dL — ABNORMAL LOW (ref 3.5–5.0)
BILIRUBIN TOTAL: 0.6 mg/dL (ref 0.3–1.2)
BUN: 12 mg/dL (ref 6–20)
CALCIUM: 9.3 mg/dL (ref 8.9–10.3)
CO2: 25 mmol/L (ref 22–32)
Chloride: 93 mmol/L — ABNORMAL LOW (ref 101–111)
Creatinine, Ser: 0.67 mg/dL (ref 0.61–1.24)
GFR calc Af Amer: >60 mL/min (ref >60)
GLUCOSE: 174 mg/dL — AB (ref 65–99)
Potassium: 3.2 mmol/L — ABNORMAL LOW (ref 3.5–5.1)
Sodium: 127 mmol/L — ABNORMAL LOW (ref 135–145)
TOTAL PROTEIN: 6.7 g/dL (ref 6.5–8.1)

## 2017-02-12 LAB — MRSA PCR SCREENING: MRSA BY PCR: NEGATIVE

## 2017-02-12 LAB — GLUCOSE, CAPILLARY
GLUCOSE-CAPILLARY: 127 mg/dL — AB (ref 65–99)
Glucose-Capillary: 110 mg/dL — ABNORMAL HIGH (ref 65–99)

## 2017-02-12 LAB — TROPONIN I
Troponin I: 0.05 ng/mL (ref <0.03)
Troponin I: 0.06 ng/mL (ref <0.03)
Troponin I: 0.05 ng/mL (ref <0.03)

## 2017-02-12 LAB — SAMPLE TO BLOOD BANK

## 2017-02-12 LAB — BASIC METABOLIC PANEL
ANION GAP: 6 (ref 5–15)
BUN: 9 mg/dL (ref 6–20)
CHLORIDE: 103 mmol/L (ref 101–111)
CO2: 27 mmol/L (ref 22–32)
Calcium: 9 mg/dL (ref 8.9–10.3)
Creatinine, Ser: 0.71 mg/dL (ref 0.61–1.24)
GFR calc non Af Amer: >60 mL/min (ref >60)
GLUCOSE: 128 mg/dL — AB (ref 65–99)
POTASSIUM: 3.4 mmol/L — AB (ref 3.5–5.1)
Sodium: 136 mmol/L (ref 135–145)

## 2017-02-12 LAB — TSH: TSH: 3.011 u[IU]/mL (ref 0.350–4.500)

## 2017-02-12 LAB — BRAIN NATRIURETIC PEPTIDE: B Natriuretic Peptide: 121.2 pg/mL — ABNORMAL HIGH (ref 0.0–100.0)

## 2017-02-12 LAB — APTT: aPTT: 39 seconds — ABNORMAL HIGH (ref 24–36)

## 2017-02-12 LAB — ECHOCARDIOGRAM COMPLETE
Height: 67 in
Weight: 2430.35 oz

## 2017-02-12 LAB — MAGNESIUM: Magnesium: 1.9 mg/dL (ref 1.7–2.4)

## 2017-02-12 LAB — PROTIME-INR
INR: 1.24
PROTHROMBIN TIME: 15.7 s — AB (ref 11.4–15.2)

## 2017-02-12 MED ORDER — IOPAMIDOL (ISOVUE-370) INJECTION 76%
INTRAVENOUS | Status: AC
  Administered 2017-02-12: 100 mL via INTRAVENOUS
  Filled 2017-02-12: qty 100

## 2017-02-12 MED ORDER — SODIUM CHLORIDE 0.9% FLUSH
10.0000 mL | INTRAVENOUS | Status: DC | PRN
  Administered 2017-02-14: 10 mL
  Filled 2017-02-12: qty 40

## 2017-02-12 MED ORDER — POTASSIUM CHLORIDE CRYS ER 20 MEQ PO TBCR
40.0000 meq | EXTENDED_RELEASE_TABLET | Freq: Once | ORAL | Status: AC
  Administered 2017-02-12: 40 meq via ORAL
  Filled 2017-02-12: qty 2

## 2017-02-12 MED ORDER — LOSARTAN POTASSIUM 50 MG PO TABS: 100.0000 mg | ORAL_TABLET | Freq: Every day | ORAL | Status: DC

## 2017-02-12 MED ORDER — ENSURE ENLIVE PO LIQD
237.0000 mL | Freq: Two times a day (BID) | ORAL | Status: DC
  Administered 2017-02-12 – 2017-02-13 (×2): 237 mL via ORAL

## 2017-02-12 MED ORDER — FENTANYL CITRATE (PF) 100 MCG/2ML IJ SOLN
50.0000 ug | Freq: Once | INTRAMUSCULAR | Status: AC
  Administered 2017-02-12: 50 ug via INTRAVENOUS
  Filled 2017-02-12: qty 2

## 2017-02-12 MED ORDER — HEPARIN BOLUS VIA INFUSION
4000.0000 [IU] | Freq: Once | INTRAVENOUS | Status: AC
  Administered 2017-02-12: 4000 [IU] via INTRAVENOUS
  Filled 2017-02-12: qty 4000

## 2017-02-12 MED ORDER — FERROUS SULFATE 325 (65 FE) MG PO TABS
325.0000 mg | ORAL_TABLET | Freq: Two times a day (BID) | ORAL | Status: DC
  Administered 2017-02-12 – 2017-02-14 (×5): 325 mg via ORAL
  Filled 2017-02-12 (×5): qty 1

## 2017-02-12 MED ORDER — ALBUTEROL SULFATE (2.5 MG/3ML) 0.083% IN NEBU: 2.5000 mg | INHALATION_SOLUTION | RESPIRATORY_TRACT | Status: DC | PRN

## 2017-02-12 MED ORDER — HYDRALAZINE HCL 20 MG/ML IJ SOLN: 5.0000 mg | INTRAMUSCULAR | Status: DC | PRN

## 2017-02-12 MED ORDER — HEPARIN (PORCINE) IN NACL 100-0.45 UNIT/ML-% IJ SOLN
1600.0000 [IU]/h | INTRAMUSCULAR | Status: DC
  Filled 2017-02-12 (×2): qty 250

## 2017-02-12 MED ORDER — PROCHLORPERAZINE MALEATE 10 MG PO TABS
10.0000 mg | ORAL_TABLET | Freq: Four times a day (QID) | ORAL | Status: DC | PRN
  Administered 2017-02-13 – 2017-02-14 (×2): 10 mg via ORAL
  Filled 2017-02-12 (×2): qty 1

## 2017-02-12 MED ORDER — ZOLPIDEM TARTRATE 5 MG PO TABS
5.0000 mg | ORAL_TABLET | Freq: Every evening | ORAL | Status: DC | PRN
  Administered 2017-02-12: 5 mg via ORAL
  Filled 2017-02-12: qty 1

## 2017-02-12 MED ORDER — SODIUM CHLORIDE 0.9 % IV BOLUS (SEPSIS)
500.0000 mL | Freq: Once | INTRAVENOUS | Status: AC
  Administered 2017-02-12: 500 mL via INTRAVENOUS

## 2017-02-12 MED ORDER — SODIUM CHLORIDE 0.9 % IV SOLN
INTRAVENOUS | Status: DC
  Administered 2017-02-12: 05:00:00 via INTRAVENOUS

## 2017-02-12 MED ORDER — ONDANSETRON HCL 4 MG PO TABS
8.0000 mg | ORAL_TABLET | Freq: Three times a day (TID) | ORAL | Status: DC | PRN
  Administered 2017-02-12 (×2): 8 mg via ORAL
  Filled 2017-02-12 (×2): qty 2

## 2017-02-12 MED ORDER — CHLORHEXIDINE GLUCONATE CLOTH 2 % EX PADS
6.0000 | MEDICATED_PAD | Freq: Every day | CUTANEOUS | Status: DC
  Administered 2017-02-12 – 2017-02-14 (×3): 6 via TOPICAL

## 2017-02-12 MED ORDER — LIP MEDEX EX OINT
TOPICAL_OINTMENT | CUTANEOUS | Status: AC
  Administered 2017-02-12: 1
  Filled 2017-02-12: qty 7

## 2017-02-12 MED ORDER — SODIUM CHLORIDE 0.9 % IV BOLUS (SEPSIS)
1000.0000 mL | Freq: Once | INTRAVENOUS | Status: AC
  Administered 2017-02-12: 1000 mL via INTRAVENOUS

## 2017-02-12 MED ORDER — AMLODIPINE BESYLATE 5 MG PO TABS
10.0000 mg | ORAL_TABLET | Freq: Every day | ORAL | Status: DC
  Administered 2017-02-12 – 2017-02-13 (×2): 10 mg via ORAL
  Filled 2017-02-12 (×2): qty 2

## 2017-02-12 MED ORDER — DM-GUAIFENESIN ER 30-600 MG PO TB12: 1.0000 | ORAL_TABLET | Freq: Two times a day (BID) | ORAL | Status: DC | PRN

## 2017-02-12 MED ORDER — SODIUM CHLORIDE 0.9% FLUSH
3.0000 mL | Freq: Two times a day (BID) | INTRAVENOUS | Status: DC
  Administered 2017-02-13 (×2): 3 mL via INTRAVENOUS

## 2017-02-12 MED ORDER — CALCIUM CARBONATE ANTACID 500 MG PO CHEW
1.0000 | CHEWABLE_TABLET | Freq: Every day | ORAL | Status: DC | PRN
  Filled 2017-02-12 (×2): qty 1

## 2017-02-12 MED ORDER — HEPARIN (PORCINE) IN NACL 100-0.45 UNIT/ML-% IJ SOLN
1500.0000 [IU]/h | INTRAMUSCULAR | Status: DC
  Administered 2017-02-12 (×2): 1200 [IU]/h via INTRAVENOUS
  Administered 2017-02-12: 1500 [IU]/h via INTRAVENOUS
  Filled 2017-02-12 (×3): qty 250

## 2017-02-12 MED ORDER — BISACODYL 5 MG PO TBEC: 5.0000 mg | DELAYED_RELEASE_TABLET | Freq: Every day | ORAL | Status: DC | PRN

## 2017-02-12 MED ORDER — ASPIRIN EC 81 MG PO TBEC
81.0000 mg | DELAYED_RELEASE_TABLET | Freq: Every day | ORAL | Status: DC
  Administered 2017-02-12 – 2017-02-13 (×2): 81 mg via ORAL
  Filled 2017-02-12 (×2): qty 1

## 2017-02-12 MED ORDER — IOPAMIDOL (ISOVUE-370) INJECTION 76%
100.0000 mL | Freq: Once | INTRAVENOUS | Status: AC | PRN
  Administered 2017-02-12: 100 mL via INTRAVENOUS

## 2017-02-12 MED ORDER — OXYCODONE-ACETAMINOPHEN 5-325 MG PO TABS
1.0000 | ORAL_TABLET | ORAL | Status: DC | PRN
  Administered 2017-02-12: 2 via ORAL
  Administered 2017-02-12: 1 via ORAL
  Administered 2017-02-13 – 2017-02-14 (×3): 2 via ORAL
  Filled 2017-02-12 (×2): qty 2
  Filled 2017-02-12: qty 1
  Filled 2017-02-12 (×3): qty 2

## 2017-02-12 MED ORDER — POTASSIUM CHLORIDE 20 MEQ/15ML (10%) PO SOLN
40.0000 meq | Freq: Once | ORAL | Status: AC
  Administered 2017-02-12: 40 meq via ORAL
  Filled 2017-02-12: qty 30

## 2017-02-12 NOTE — Progress Notes (Signed)
**  Preliminary report by tech**  Bilateral lower extremity venous duplex complete. There is no evidence of deep or superficial vein thrombosis involving the right and left lower extremities. All visualized vessels appear patent and compressible. There is no evidence of Baker's cysts bilaterally.  02/12/17 9:20 AM [name] RVT

## 2017-02-12 NOTE — Progress Notes (Signed)
[facility] for IV heparin Indication: pulmonary embolus  Allergies  Allergen Reactions  . Lisinopril Other (See Comments)    coughing    Patient Measurements: Height: 5\' 7"  (170.2 cm) Weight: 151 lb 14.4 oz (68.9 kg) IBW/kg (Calculated) : 66.1 Heparin Dosing Weight: 68.9  Vital Signs: Temp: 98.6 F (37 C) (03/07 1304) Temp Source: Oral (03/07 0646) BP: 114/72 (03/07 0800) Pulse Rate: 107 (03/07 0401)  Labs:  Recent Labs  02/12/17 0031 02/12/17 0959 02/12/17 1318  HGB 10.5* 10.6*  --   HCT 32.0* 32.0*  --   PLT 244 262  --   APTT 39*  --   --   LABPROT 15.7*  --   --   INR 1.24  --   --   HEPARINUNFRC  --   --  <0.10*  CREATININE 0.67 0.71  --   TROPONINI 0.05* 0.06* 0.05*    Estimated Creatinine Clearance: 94.1 mL/min (by C-G formula based on SCr of 0.71 mg/dL).   Medical History: Past Medical History:  Diagnosis Date  . Cancer ([place])   . Chronic hepatitis B virus infection ([place]) 02/15/2004   Annotation: 02/15/2004:  +Core Ab, +SAg, -SAb, -Be Ag with elevated liver  enzymes HBV DNA via PCR:  <2.3 AFP normal Evaluated by [facility] 07/24/2004--Dr. Patsy [place] Qualifier: Diagnosis of  By: Amil Amen MD, Benjamine Mola    . Diabetes mellitus without complication (HCC)    no meds  . High cholesterol 2006  . Hyperlipidemia   . Hypertension   . Prediabetes 08/31/2016   Lab Results Component Value Date  HGBA1C 6.2 (H) 08/30/2015      Medications:  Scheduled:  . amLODipine  10 mg Oral Daily  . aspirin EC  81 mg Oral Daily  . Chlorhexidine Gluconate Cloth  6 each Topical Daily  . feeding supplement (ENSURE ENLIVE)  237 mL Oral BID BM  . ferrous sulfate  325 mg Oral BID WC  . heparin  4,000 Units Intravenous Once  . sodium chloride flush  3 mL Intravenous Q12H   Infusions:  . heparin 1,200 Units/hr (02/12/17 0603)    Assessment: 23 yoM c/o generalized malaise, weakness, acute dyspnea on exertion found to have acute PE on  CT with right heart strain.   02/12/2017:   1st heparin level <0.1  No bleeding reported  No infusion related issues reported by RN  Goal of Therapy:  Heparin level 0.3-0.7 units/ml Monitor platelets by anticoagulation protocol: Yes   Plan:   Re-bolus Heparin 4000 units x1 Increase Heparin infusion to 1500 units/hr Re-check 6h heparin level Daily CBC/HL  Netta Cedars, PharmD, BCPS Pager: [phone] 02/12/2017,2:37 PM

## 2017-02-12 NOTE — Progress Notes (Signed)
ANTICOAGULATION CONSULT NOTE - Initial Consult  Pharmacy Consult for IV heparin Indication: pulmonary embolus  Allergies  Allergen Reactions  . Lisinopril Other (See Comments)    coughing   Patient Measurements: Height: 5\' 7"  (170.2 cm) Weight: 151 lb 14.4 oz (68.9 kg) IBW/kg (Calculated) : 66.1 Heparin Dosing Weight: 68.9  Vital Signs: Temp: 100.1 F (37.8 C) (03/07 2014) Temp Source: Oral (03/07 2014) BP: 117/93 (03/07 2000) Pulse Rate: 141 (03/07 2000)  Labs:  Recent Labs  02/12/17 0031 02/12/17 0959 02/12/17 1318 02/12/17 2052  HGB 10.5* 10.6*  --   --   HCT 32.0* 32.0*  --   --   PLT 244 262  --   --   APTT 39*  --   --   --   LABPROT 15.7*  --   --   --   INR 1.24  --   --   --   HEPARINUNFRC  --   --  <0.10* 0.30  CREATININE 0.67 0.71  --   --   TROPONINI 0.05* 0.06* 0.05*  --    Estimated Creatinine Clearance: 94.1 mL/min (by C-G formula based on SCr of 0.71 mg/dL).  Medical History: Past Medical History:  Diagnosis Date  . Cancer ([place])   . Chronic hepatitis B virus infection ([place]) 02/15/2004   Annotation: 02/15/2004:  +Core Ab, +SAg, -SAb, -Be Ag with elevated liver  enzymes HBV DNA via PCR:  <2.3 AFP normal Evaluated by [facility] 07/24/2004--Dr. Patsy [place] Qualifier: Diagnosis of  By: Amil Amen MD, Benjamine Mola    . Diabetes mellitus without complication (HCC)    no meds  . High cholesterol 2006  . Hyperlipidemia   . Hypertension   . Prediabetes 08/31/2016   Lab Results Component Value Date  HGBA1C 6.2 (H) 08/30/2015     Medications:  Scheduled:  . amLODipine  10 mg Oral Daily  . aspirin EC  81 mg Oral Daily  . Chlorhexidine Gluconate Cloth  6 each Topical Daily  . feeding supplement (ENSURE ENLIVE)  237 mL Oral BID BM  . ferrous sulfate  325 mg Oral BID WC  . sodium chloride flush  3 mL Intravenous Q12H   Infusions:  . heparin 1,500 Units/hr (02/12/17 1721)   Assessment: 69 yoM c/o generalized malaise, weakness, acute dyspnea on  exertion found to have acute PE on CT with right heart strain.   Today, 02/12/2017  Heparin 4000 unit bolus x1, Start heparin drip at 1200 units/hr 1st heparin level <0.1 - rebolused with 4000 units, increased infusion to 1500 units/hr No bleeding reported  Repeat level at 1900: 0.3 units/ml  Goal of Therapy:  Heparin level 0.3-0.7 units/ml Monitor platelets by anticoagulation protocol: Yes   Plan:   Increase Heparin infusion to 1600 units/hr Daily CBC/HL  Minda Ditto PharmD Pager [phone] 02/12/2017, 10:16 PM

## 2017-02-12 NOTE — ED Notes (Signed)
Pt states he would prefer an IV over his port being accessed

## 2017-02-12 NOTE — Progress Notes (Signed)
ANTICOAGULATION CONSULT NOTE - Initial Consult  Pharmacy Consult for IV heparin Indication: pulmonary embolus  Allergies  Allergen Reactions  . Lisinopril Other (See Comments)    coughing    Patient Measurements:   Heparin Dosing Weight: 68.9  Vital Signs: Temp: 97.8 F (36.6 C) (03/06 2333) Temp Source: Oral (03/06 2333) BP: 117/82 (03/07 0300) Pulse Rate: 105 (03/07 0300)  Labs:  Recent Labs  02/12/17 0031  HGB 10.5*  HCT 32.0*  PLT 244  APTT 39*  LABPROT 15.7*  INR 1.24  CREATININE 0.67  TROPONINI 0.05*    Estimated Creatinine Clearance: 94.1 mL/min (by C-G formula based on SCr of 0.67 mg/dL).   Medical History: Past Medical History:  Diagnosis Date  . Cancer ([place])   . Chronic hepatitis B virus infection ([place]) 02/15/2004   Annotation: 02/15/2004:  +Core Ab, +SAg, -SAb, -Be Ag with elevated liver  enzymes HBV DNA via PCR:  <2.3 AFP normal Evaluated by [facility] 07/24/2004--Dr. Patsy [place] Qualifier: Diagnosis of  By: Amil Amen MD, Benjamine Mola    . Diabetes mellitus without complication (HCC)    no meds  . High cholesterol 2006  . Hyperlipidemia   . Hypertension   . Prediabetes 08/31/2016   Lab Results Component Value Date  HGBA1C 6.2 (H) 08/30/2015      Medications:  Scheduled:  . heparin  4,000 Units Intravenous Once   Infusions:  . heparin      Assessment: 49 yoM c/o generalized malaise, weakness, acute dyspnea on exertion found to have acute PE on CT with right heart strain.  3/7 Aptt=36, INR=1.24 Goal of Therapy:  Heparin level 0.3-0.7 units/ml Monitor platelets by anticoagulation protocol: Yes   Plan:   Heparin 4000 unit bolus x1 Start heparin drip at 1200 units/hr Daily CBC/HL Check 1st HL in 6 hours  Dorrene German 02/12/2017,3:20 AM

## 2017-02-12 NOTE — ED Notes (Signed)
MD aware of abnormal troponin

## 2017-02-12 NOTE — H&P (Signed)
History and Physical    Justin [name] EYC:[id number] DOB: Nov 05, 1961 DOA: 02/11/2017  Referring MD/NP/PA:   PCP: Delman Cheadle, MD   Patient coming from:  The patient is coming from home.  At baseline, pt is independent for most of ADL.  Chief Complaint: Shortness of breath and generalized weakness.  HPI: Justin [name] is a 56 y.o. 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  Patient states that he has been having generalized weakness and SOB for almost a week.He has dry cough, no fever or chills. She denies chest pain, tenderness over calf areas. No recent long distant traveling. Patient has nausea and vomited last night. No diarrhea. He has mild abdominal pain diffusely. No symptoms of UTI or unilateral weakness.  ED Course: pt was found to have positive troponin 0.05, WBC 15.3, BNP 12/29/2020, INR 1.24, PTT 39, potassium 3.2, sodium 127, creatinine normal, temperature normal, tachycardia, tachypnea, O2 sat are 93% on room air, CTA of chest showed bilateral scattered pulmonary embolism with evidence of right heart strain, possibly increased right hepatic lobe mass, extensive bulky mediastinal, mild mass effect on the left atrium which is flattened posteriorlyand right hilar adenopathy, scattered bilateral pulmonary nodules several of which are new from previous, and may also reflect metastatic disease. Pt is admitted to SDU as inpt.  Review of Systems:   General: no fevers, chills, no changes in body weight, has poor appetite, has fatigue HEENT: no blurry vision, hearing changes or sore throat Respiratory: has dyspnea, coughing, no wheezing CV: no chest pain, no palpitations GI: had nausea, vomiting, abdominal pain,  No diarrhea, constipation GU: no dysuria, burning on urination, increased urinary frequency, hematuria  Ext: no leg edema Neuro: no unilateral weakness,  numbness, or tingling, no vision change or hearing loss Skin: no rash, no skin tear. Has skin nodules. MSK: No muscle spasm, no deformity, no limitation of range of movement in spin Heme: No easy bruising.  Travel history: No recent long distant travel.  Allergy:  Allergies  Allergen Reactions  . Lisinopril Other (See Comments)    coughing    Past Medical History:  Diagnosis Date  . Cancer ([place])   . Chronic hepatitis B virus infection ([place]) 02/15/2004   Annotation: 02/15/2004:  +Core Ab, +SAg, -SAb, -Be Ag with elevated liver  enzymes HBV DNA via PCR:  <2.3 AFP normal Evaluated by [facility] 07/24/2004--Dr. Patsy [place] Qualifier: Diagnosis of  By: Amil Amen MD, Benjamine Mola    . Diabetes mellitus without complication (HCC)    no meds  . High cholesterol 2006  . Hyperlipidemia   . Hypertension   . Prediabetes 08/31/2016   Lab Results Component Value Date  HGBA1C 6.2 (H) 08/30/2015      Past Surgical History:  Procedure Laterality Date  . EUS N/A 12/26/2016   Procedure: UPPER ENDOSCOPIC ULTRASOUND (EUS) LINEAR;  Surgeon: Milus Banister, MD;  Location: [facility];  Service: Endoscopy;  Laterality: N/A;  . FINE NEEDLE ASPIRATION N/A 12/26/2016   Procedure: FINE NEEDLE ASPIRATION (FNA) LINEAR;  Surgeon: Milus Banister, MD;  Location: [facility];  Service: Endoscopy;  Laterality: N/A;  . IR GENERIC HISTORICAL  12/19/2016   IR RADIOLOGIST EVAL & MGMT 12/19/2016 Arne Cleveland, MD [facility]  . IR GENERIC HISTORICAL  01/09/2017   IR ANGIOGRAM SELECTIVE EACH ADDITIONAL VESSEL 01/09/2017 Arne Cleveland, MD [facility]  . IR GENERIC HISTORICAL  01/09/2017   IR ANGIOGRAM  VISCERAL SELECTIVE 01/09/2017 Arne Cleveland, MD [facility]  . IR GENERIC HISTORICAL  01/09/2017   IR ANGIOGRAM SELECTIVE EACH ADDITIONAL VESSEL 01/09/2017 Arne Cleveland, MD [facility]  . IR GENERIC HISTORICAL  01/09/2017   IR ANGIOGRAM VISCERAL SELECTIVE 01/09/2017 Arne Cleveland, MD [facility]  . IR GENERIC  HISTORICAL  01/09/2017   IR ANGIOGRAM SELECTIVE EACH ADDITIONAL VESSEL 01/09/2017 Arne Cleveland, MD [facility]  . IR GENERIC HISTORICAL  01/09/2017   IR US GUIDE VASC ACCESS RIGHT 01/09/2017 Arne Cleveland, MD [facility]  . IR GENERIC HISTORICAL  01/09/2017   IR EMBO ARTERIAL NOT HEMORR HEMANG INC GUIDE ROADMAPPING 01/09/2017 Arne Cleveland, MD [facility]  . IR GENERIC HISTORICAL  01/30/2017   IR FLUORO GUIDE PORT INSERTION RIGHT 01/30/2017 Markus Daft, MD [facility]  . IR GENERIC HISTORICAL  01/30/2017   IR US GUIDE VASC ACCESS RIGHT 01/30/2017 Markus Daft, MD [facility]    Social History:  reports that he has never smoked. He has never used smokeless tobacco. He reports that he does not drink alcohol or use drugs.  Family History:  Family History  Problem Relation Age of Onset  . Heart disease Father   . Hyperlipidemia Father      Prior to Admission medications   Medication Sig Start Date End Date Taking? Authorizing Provider  amLODipine (NORVASC) 10 MG tablet Take 1 tablet (10 mg total) by mouth daily. 01/16/17  Yes Ladell Pier, MD  bisacodyl (DULCOLAX) 5 MG EC tablet Take 5 mg by mouth daily as needed for moderate constipation.   Yes Historical Provider, MD  calcium carbonate (TUMS - DOSED IN MG ELEMENTAL CALCIUM) 500 MG chewable tablet Chew 1 tablet by mouth daily as needed for indigestion or heartburn.   Yes Historical Provider, MD  ferrous sulfate 325 (65 FE) MG tablet Take 325 mg by mouth 2 (two) times daily with a meal.   Yes Historical Provider, MD  lidocaine-prilocaine (EMLA) cream Apply to port site one hour prior to use. Do not rub in, cover with plastic. 01/31/17  Yes Ladell Pier, MD  losartan (COZAAR) 100 MG tablet Take 1 tablet (100 mg total) by mouth daily. 12/23/16  Yes Shawnee Knapp, MD  naproxen sodium (ANAPROX) 220 MG tablet Take 220 mg by mouth 2 (two) times daily as needed (for pain/fever/headache.).   Yes Historical Provider, MD  ondansetron (ZOFRAN) 8 MG tablet  Take 1 tablet (8 mg total) by mouth every 8 (eight) hours as needed for nausea or vomiting. 01/23/17  Yes Ladell Pier, MD  oxyCODONE-acetaminophen (PERCOCET/ROXICET) 5-325 MG tablet Take 1-2 tablets by mouth every 4 (four) hours as needed for severe pain. 01/23/17  Yes Ladell Pier, MD  potassium chloride SA (K-DUR,KLOR-CON) 20 MEQ tablet Take 1 tablet (20 mEq total) by mouth daily. 01/23/17  Yes Ladell Pier, MD  prochlorperazine (COMPAZINE) 10 MG tablet Take 1 tablet (10 mg total) by mouth every 6 (six) hours as needed for nausea or vomiting. 01/23/17  Yes Ladell Pier, MD  aspirin EC 81 MG tablet Take 1 tablet (81 mg total) by mouth daily. Patient not taking: Reported on 02/12/2017 10/15/16   Shawnee Knapp, MD  methylPREDNISolone (MEDROL DOSEPAK) 4 MG TBPK tablet Take as directed Patient not taking: Reported on 02/12/2017 02/04/17   Owens Shark, NP    Physical Exam: Vitals:   02/12/17 0300 02/12/17 0400 02/12/17 0401 02/12/17 0454  BP: 117/82 128/87 128/87   Pulse: 105 106 107  Resp: 21 23 21    Temp:    98.6 F (37 C)  TempSrc:    Oral  SpO2: 93% 92% 93%    General: Not in acute distress. Dry mucus and membrane. HEENT:       Eyes: PERRL, EOMI, no scleral icterus.       ENT: No discharge from the ears and nose, no pharynx injection, no tonsillar enlargement.        Neck: No JVD, no bruit, no mass felt. Heme: Has enlarged lymph node in L side of neck and Rgroin area.. Cardiac: S1/S2, RRR, No murmurs, No gallops or rubs. Respiratory: No rales, wheezing, rhonchi or rubs. GI: Soft, nondistended, has mild tenderness diffusely, no rebound pain, BS present. GU: No hematuria Ext: No pitting leg edema bilaterally. 2+DP/PT pulse bilaterally. Musculoskeletal: No joint deformities, No joint redness or warmth, no limitation of ROM in spin. Skin: No rashes. Has skin nodules in the right arm and left flank area. Neuro: Alert, oriented X3, cranial nerves II-XII grossly intact, moves all  extremities normally. Psych: Patient is not psychotic, no suicidal or hemocidal ideation.  Labs on Admission: I have personally reviewed following labs and imaging studies  CBC:  Recent Labs Lab 02/12/17 0031  WBC 15.3*  NEUTROABS 11.4*  HGB 10.5*  HCT 32.0*  MCV 76.7*  PLT 607   Basic Metabolic Panel:  Recent Labs Lab 02/12/17 0031  NA 127*  K 3.2*  CL 93*  CO2 25  GLUCOSE 174*  BUN 12  CREATININE 0.67  CALCIUM 9.3   GFR: Estimated Creatinine Clearance: 94.1 mL/min (by C-G formula based on SCr of 0.67 mg/dL). Liver Function Tests:  Recent Labs Lab 02/12/17 0031  AST 34  ALT 27  ALKPHOS 133*  BILITOT 0.6  PROT 6.7  ALBUMIN 2.6*   No results for input(s): LIPASE, AMYLASE in the last 168 hours. No results for input(s): AMMONIA in the last 168 hours. Coagulation Profile:  Recent Labs Lab 02/12/17 0031  INR 1.24   Cardiac Enzymes:  Recent Labs Lab 02/12/17 0031  TROPONINI 0.05*   BNP (last 3 results) No results for input(s): PROBNP in the last 8760 hours. HbA1C: No results for input(s): HGBA1C in the last 72 hours. CBG:  Recent Labs Lab 02/12/17 0538  GLUCAP 127*   Lipid Profile: No results for input(s): CHOL, HDL, LDLCALC, TRIG, CHOLHDL, LDLDIRECT in the last 72 hours. Thyroid Function Tests: No results for input(s): TSH, T4TOTAL, FREET4, T3FREE, THYROIDAB in the last 72 hours. Anemia Panel: No results for input(s): VITAMINB12, FOLATE, FERRITIN, TIBC, IRON, RETICCTPCT in the last 72 hours. Urine analysis:    Component Value Date/Time   COLORURINE dk. yellow 12/21/2010 0921   LABSPEC <1.005 01/18/2011 1006   PHURINE 6.5 01/18/2011 1006   HGBUR negative 01/18/2011 1006   BILIRUBINUR small (A) 12/07/2016 0855   KETONESUR negative 12/07/2016 0855   PROTEINUR =30 (A) 12/07/2016 0855   UROBILINOGEN 1.0 12/07/2016 0855   UROBILINOGEN 0.2 01/18/2011 1006   NITRITE Negative 12/07/2016 0855   NITRITE negative 01/18/2011 1006    LEUKOCYTESUR Negative 12/07/2016 0855   Sepsis Labs: @LABRCNTIP (procalcitonin:4,lacticidven:4) )No results found for this or any previous visit (from the past 240 hour(s)).   Radiological Exams on Admission: Ct Angio Chest Pe W/cm &/or Wo Cm  Result Date: 02/12/2017 CLINICAL DATA:  Initial evaluation for acute dyspnea on exertion, tachycardia. History of hepatocellular carcinoma. EXAM: CT ANGIOGRAPHY CHEST WITH CONTRAST TECHNIQUE: Multidetector CT imaging of the chest was performed using the standard  protocol during bolus administration of intravenous contrast. Multiplanar CT image reconstructions and MIPs were obtained to evaluate the vascular anatomy. CONTRAST:  100 cc of Isovue 370. COMPARISON:  Prior CT from 12/17/2016. FINDINGS: Cardiovascular: Intrathoracic aorta of normal caliber without acute abnormality. Visualized great vessels within normal limits. Right-sided Port-A-Cath in place. Heart size within normal limits. No pericardial effusion. Pulmonary arterial tree adequately opacified for evaluation. Main pulmonary artery within normal limits for size measuring 2.9 cm in diameter. There is scattered nonocclusive thrombus involving segmental pulmonary artery supplying the right lower lobe (series 6, image 133). Additional scattered nonocclusive thrombus within segmental lingular branches (series 6, image 142). Possible additional minimal clot within segmental left upper lobe branches (series 10, image 61) no other significant clot identified. RV to LV ratio mildly elevated at 1.0. Mild straightening of the intraventricular septum. Mediastinum/Nodes: Few scattered subcentimeter hypodense nodules noted within the thyroid, of doubtful significance. Bulky right paratracheal adenopathy measuring 3.5 x 3.0 cm (series 6, image 90). Additional bulky precarinal nodes measure approximately 2.2 x 4.9 cm. Large subcarinal nodal conglomerate measures 4.4 x 7.5 x 12.0 cm. This extends from the subcarinal region  inferiorly towards the GE junction. Secondary mild mass effect on the left atrium which is flattened posteriorly. This extends inferiorly towards the GE junction along the anterior margin of the aorta. Scattered periaortic nodes present posteriorly measuring up to 1 cm (series 6, image 207). Bulky right hilar node measures 2.3 x 2.4 cm (series 6, image 135). Lungs/Pleura: Tracheobronchial tree is patent. Right hemidiaphragm is elevated with associated right basilar atelectasis. Irregular lobular bulging of the right hemidiaphragm from the subjacent liver (series 11, image 65). 4 mm right lower lobe nodule (series 7, image 43). Additional 3 mm right upper lobe nodule (series 7, image 44). 6 mm subpleural left upper lobe nodule (series 7, image 28). Additional 4 mm subpleural left upper lobe nodule (series 7, image 24). Few additional scattered right middle lobe nodules measure up to 8 mm (series 5, image 55). No focal infiltrates.  No pleural effusion.  No pneumothorax. Upper Abdomen: Large heterogeneous mass involving the right hepatic lobe again seen, compatible with history of hepatocellular carcinoma. Although direct measurements are somewhat difficult due to the infiltrative nature of this lesion, this appears increased in size from previous. Remainder the visualized upper abdomen otherwise unremarkable. Musculoskeletal: No acute osseous abnormality. No worrisome lytic or blastic osseous lesions. Review of the MIP images confirms the above findings. IMPRESSION: 1. Scattered acute pulmonary emboli involving bilateral segmental pulmonary arteries as above, with greatest involvement within the right lower lobe. Positive for acute PE with CT evidence of right heart strain (RV/LV Ratio = 1.0) consistent with at least submassive (intermediate risk) PE. The presence of right heart strain has been associated with an increased risk of morbidity and mortality. Please activate Code PE by paging [phone]. 2. Large  infiltrative heterogeneous right hepatic lobe mass, compatible with history of hepatocellular carcinoma, suspected to be increased in size from previous. 3. Extensive bulky mediastinal and right hilar adenopathy as above, compatible with nodal metastases. 4. Scattered bilateral pulmonary nodules as above, several of which are new from previous, and may also reflect metastatic disease. Attention at follow-up recommended. Critical Value/emergent results were called by telephone at the time of interpretation on 02/12/2017 at 3:04 am to Dr. Rolland Porter , who verbally acknowledged these results. Electronically Signed   By: Jeannine Boga M.D.   On: 02/12/2017 03:08   Dg Chest [facility] [address]  Result Date: 02/12/2017 CLINICAL DATA:  Fever, nausea and weakness tonight. Patient on chemotherapy for liver cancer. EXAM: PORTABLE CHEST 1 VIEW COMPARISON:  CXR from 02/27/2004 and addendum CT abdomen from 12/17/2016 FINDINGS: Elevated appearance of the right hemidiaphragm unchanged from prior CT of the abdomen likely secondary to an enlarged right hepatic lobe mass seen on CT beneath the right hemidiaphragm. This is not believed to due to a subpulmonic effusion. Left lung is clear. There is mild interstitial edema. Port catheter tip is seen in the distal SVC. No pulmonary consolidation. No pneumothorax. The nodular densities seen prior CT within the right middle lobe upper not radiographically apparent. However there may be a 6 mm nodular density in the left upper lobe. No suspicious osseous lesions. IMPRESSION: Mild interstitial edema. Possible 6 mm nodular density in the left upper lobe versus a summation vascular shadows. Elevated right hemidiaphragm likely from known underlying hepatic mass. Electronically Signed   By: Ashley Royalty M.D.   On: 02/12/2017 01:12     EKG: Independently reviewed. Sinus rhythm, QTC 485, tachycardia, nonspecific T-wave change.  Assessment/Plan Principal Problem:   PE (pulmonary  thromboembolism) (HCC) Active Problems:   Essential hypertension   Prediabetes   Hepatocellular carcinoma (HCC)   Mediastinal adenopathy   Elevated troponin   Hyponatremia   Leukocytosis   PE (pulmonary thromboembolism) ([place]): CT angiogram of chest showed submassive PE with evidence of right heart strain. Currently hemodynamically stable. This is most likely related hepatocellular cancer.  -admit to stepdown for close monitoring -heparin drip initiated -2D echocardiogram ordered -LE dopplers ordered to evaluate for DVT -pain control: When necessary Percocet and morphine  Elevated troponin: trop 0.05. Most likely due to demand ischemia secondary to submassive PE. -Continue aspirin -Follow up 2-D echo -trend trop x 3  HTN: -Continue amlodipine -hold cozarr due to hyponatremia  Prediabetes: Last A1c 7.1 on 12/07/16. Not taking meds at home. bp sugar 174 on admission -check CBG every morning.   Hyponatremia: Na 127. Mental status normal. Likely due to decreased oral intake -hold cozarr - Will check urine sodium, urine osmolality, serum osmolality. - check TSH - IVF: 1.5L NS in ED, will continue with IV normal saline at 75 mL/h - check BMP q6h  Leukocytosis: no signs of infection. Likely due to stress induced to demargination -follow up by CBC  Hypokalemia: K= 3.2 on admission. - Repleted - Check Mg level  Metastasized Hepatocellular carcinoma: f/u with Dr. Benay Spice. Had one dose of chemo on 01/31/17. CTA of chest showed possibly increased right hepatic lobe mass, extensive bulky mediastinal, mild mass effect on the left atrium which is flattened posteriorlyand right hilar adenopathy, scattered bilateral pulmonary nodules several of which are new from previous, and may also reflect metastatic disease.  -please inform Dr. Benay Spice of pt admission   DVT ppx: On IV Heparin Code Status: Full code Family Communication: Yes, patient's wife at bed side Disposition Plan:   Anticipate discharge back to previous home environment Consults called:  none Admission status: SDU/inpation       Date of Service 02/12/2017    Ivor Costa [facility] Pager [phone]  If 7PM-7AM, please contact night-coverage [email or web address] Password [facility] 02/12/2017, 5:56 AM

## 2017-02-12 NOTE — ED Provider Notes (Signed)
[facility] DEPT Provider Note   CSN: [id number] Arrival date & time: 02/11/17  2324  By signing my name below, I, Jeanell Sparrow, attest that this documentation has been prepared under the direction and in the presence of Rolland Porter, MD. Electronically Signed: Jeanell Sparrow, Scribe. 02/12/2017. 12:18 AM.  Time seen 12:15 AM  History   Chief Complaint Chief Complaint  Patient presents with  . Weakness    generalized  . Back Pain   The history is provided by the patient. No language interpreter was used.   HPI Comments: Justin [name] is a 56 y.o. male who presents to the Emergency Department complaining of constant moderate generalized weakness that started about a week ago. He states had his first chemotherapy on 01/31/17 for liver cancer (diagnosed 12/12/16). He reports associated SOB (with exertion) that is getting worse, polydipsia, back pain (onset a week ago), pallor, and lightheadedness (with standing). He thinks he may be anemic. He had an MRI done for his back pain on 2/27  and was given Medrol with minimal relief. He states he was treated for hypercalcemia before he started chemo.  He denies any hx of smoking/drinking, fever, chills, chest pain, diarrhea, blood in stool, constipation, or other complaints.    PCP: Delman Cheadle, MD Oncologist: Dr. Benay Spice   Past Medical History:  Diagnosis Date  . Cancer ([place])   . Chronic hepatitis B virus infection ([place]) 02/15/2004   Annotation: 02/15/2004:  +Core Ab, +SAg, -SAb, -Be Ag with elevated liver  enzymes HBV DNA via PCR:  <2.3 AFP normal Evaluated by [facility] 07/24/2004--Dr. Patsy [place] Qualifier: Diagnosis of  By: Amil Amen MD, Benjamine Mola    . Diabetes mellitus without complication (HCC)    no meds  . High cholesterol 2006  . Hyperlipidemia   . Hypertension   . Prediabetes 08/31/2016   Lab Results Component Value Date  HGBA1C 6.2 (H) 08/30/2015      Patient Active Problem List   Diagnosis Date Noted  . PE (pulmonary  thromboembolism) ([place]) 02/12/2017  . Hypercalcemia 01/23/2017  . Goals of care, counseling/discussion 01/23/2017  . Mediastinal adenopathy   . Hepatocellular carcinoma ([place]) 12/16/2016  . Hepatitis C 09/02/2016  . Diabetes ([place]) 09/02/2016  . Prediabetes 08/31/2016  . Dyslipidemia 11/20/2010  . Essential hypertension 11/20/2010  . Chronic hepatitis B virus infection ([place]) 02/15/2004    Past Surgical History:  Procedure Laterality Date  . EUS N/A 12/26/2016   Procedure: UPPER ENDOSCOPIC ULTRASOUND (EUS) LINEAR;  Surgeon: Milus Banister, MD;  Location: [facility] END[facility]COPY;  Service: Endoscopy;  Laterality: N/A;  . FINE NEEDLE ASPIRATION N/A 12/26/2016   Procedure: FINE NEEDLE ASPIRATION (FNA) LINEAR;  Surgeon: Milus Banister, MD;  Location: [facility];  Service: Endoscopy;  Laterality: N/A;  . IR GENERIC HISTORICAL  12/19/2016   IR RADIOLOGIST EVAL & MGMT 12/19/2016 Arne Cleveland, MD [facility] INTER[facility] RAD  . IR GENERIC HISTORICAL  01/09/2017   IR ANGIOGRAM SELECTIVE EACH ADDITIONAL VESSEL 01/09/2017 Arne Cleveland, MD [facility]  . IR GENERIC HISTORICAL  01/09/2017   IR ANGIOGRAM VISCERAL SELECTIVE 01/09/2017 Arne Cleveland, MD [facility]  . IR GENERIC HISTORICAL  01/09/2017   IR ANGIOGRAM SELECTIVE EACH ADDITIONAL VESSEL 01/09/2017 Arne Cleveland, MD [facility]  . IR GENERIC HISTORICAL  01/09/2017   IR ANGIOGRAM VISCERAL SELECTIVE 01/09/2017 Arne Cleveland, MD [facility]  . IR GENERIC HISTORICAL  01/09/2017   IR ANGIOGRAM SELECTIVE EACH ADDITIONAL VESSEL 01/09/2017 Arne Cleveland, MD [facility]  . IR GENERIC HISTORICAL  01/09/2017  IR US GUIDE VASC ACCESS RIGHT 01/09/2017 Arne Cleveland, MD [facility]  . IR GENERIC HISTORICAL  01/09/2017   IR EMBO ARTERIAL NOT HEMORR HEMANG INC GUIDE ROADMAPPING 01/09/2017 Arne Cleveland, MD [facility]  . IR GENERIC HISTORICAL  01/30/2017   IR FLUORO GUIDE PORT INSERTION RIGHT 01/30/2017 Markus Daft, MD [facility]  . IR GENERIC HISTORICAL  01/30/2017   IR US GUIDE  VASC ACCESS RIGHT 01/30/2017 Markus Daft, MD [facility]       Home Medications    Prior to Admission medications   Medication Sig Start Date End Date Taking? Authorizing Provider  amLODipine (NORVASC) 10 MG tablet Take 1 tablet (10 mg total) by mouth daily. 01/16/17  Yes Ladell Pier, MD  bisacodyl (DULCOLAX) 5 MG EC tablet Take 5 mg by mouth daily as needed for moderate constipation.   Yes Historical Provider, MD  calcium carbonate (TUMS - DOSED IN MG ELEMENTAL CALCIUM) 500 MG chewable tablet Chew 1 tablet by mouth daily as needed for indigestion or heartburn.   Yes Historical Provider, MD  ferrous sulfate 325 (65 FE) MG tablet Take 325 mg by mouth 2 (two) times daily with a meal.   Yes Historical Provider, MD  lidocaine-prilocaine (EMLA) cream Apply to port site one hour prior to use. Do not rub in, cover with plastic. 01/31/17  Yes Ladell Pier, MD  losartan (COZAAR) 100 MG tablet Take 1 tablet (100 mg total) by mouth daily. 12/23/16  Yes [place] , MD  naproxen sodium (ANAPROX) 220 MG tablet Take 220 mg by mouth 2 (two) times daily as needed (for pain/fever/headache.).   Yes Historical Provider, MD  ondansetron (ZOFRAN) 8 MG tablet Take 1 tablet (8 mg total) by mouth every 8 (eight) hours as needed for nausea or vomiting. 01/23/17  Yes Ladell Pier, MD  oxyCODONE-acetaminophen (PERCOCET/ROXICET) 5-325 MG tablet Take 1-2 tablets by mouth every 4 (four) hours as needed for severe pain. 01/23/17  Yes Ladell Pier, MD  potassium chloride SA (K-DUR,KLOR-CON) 20 MEQ tablet Take 1 tablet (20 mEq total) by mouth daily. 01/23/17  Yes Ladell Pier, MD  prochlorperazine (COMPAZINE) 10 MG tablet Take 1 tablet (10 mg total) by mouth every 6 (six) hours as needed for nausea or vomiting. 01/23/17  Yes Ladell Pier, MD  aspirin EC 81 MG tablet Take 1 tablet (81 mg total) by mouth daily. Patient not taking: Reported on 02/12/2017 10/15/16   [place] , MD  methylPREDNISolone (MEDROL DOSEPAK) 4 MG  TBPK tablet Take as directed Patient not taking: Reported on 02/12/2017 02/04/17   Owens Shark, NP    Family History Family History  Problem Relation Age of Onset  . Heart disease Father   . Hyperlipidemia Father     Social History Social History  Substance Use Topics  . Smoking status: Never Smoker  . Smokeless tobacco: Never Used  . Alcohol use No  lives at home Lives with spouse   Allergies   Lisinopril   Review of Systems Review of Systems  Constitutional: Negative for chills and fever.  Respiratory: Positive for cough and shortness of breath.   Cardiovascular: Negative for chest pain.  Gastrointestinal: Negative for blood in stool, constipation and diarrhea.  Endocrine: Positive for polydipsia.  Musculoskeletal: Positive for back pain.  Skin: Positive for pallor.  Neurological: Positive for weakness (Generalized) and light-headedness.  All other systems reviewed and are negative.    Physical Exam Updated Vital Signs BP 105/74 (BP Location:  Right Arm)   Pulse (!) 129   Temp 97.8 F (36.6 C) (Oral)   Resp 18   SpO2 93%   Vital signs normal    Physical Exam  Constitutional: He is oriented to person, place, and time.  Non-toxic appearance. He does not appear ill. No distress.  Thin male who appears to feel bad.   HENT:  Head: Normocephalic and atraumatic.  Right Ear: External ear normal.  Left Ear: External ear normal.  Nose: Nose normal. No mucosal edema or rhinorrhea.  Mouth/Throat: Oropharynx is clear and moist. Mucous membranes are dry. No dental abscesses or uvula swelling.  Eyes: EOM are normal. Pupils are equal, round, and reactive to light.  Conjunctivae are pale.   Neck: Normal range of motion and full passive range of motion without pain. Neck supple.  Cardiovascular: Normal rate, regular rhythm and normal heart sounds.  Exam reveals no gallop and no friction rub.   No murmur heard. Pulmonary/Chest: Effort normal and breath sounds normal. No  respiratory distress. He has no wheezes. He has no rhonchi. He has no rales. He exhibits no tenderness and no crepitus.  Abdominal: Soft. Normal appearance and bowel sounds are normal. He exhibits no distension. There is no tenderness. There is no rebound and no guarding.  Musculoskeletal: Normal range of motion. He exhibits no edema or tenderness.  Moves all extremities well.   Neurological: He is alert and oriented to person, place, and time. He has normal strength. No cranial nerve deficit.  Skin: Skin is warm, dry and intact. No rash noted. No erythema. There is pallor.  Psychiatric: He has a normal mood and affect. His speech is normal and behavior is normal. His mood appears not anxious.  Nursing note and vitals reviewed.    ED Treatments / Results  DIAGNOSTIC STUDIES: Oxygen Saturation is 93% on RA, normal by my interpretation.    Labs (all labs ordered are listed, but only abnormal results are displayed) Results for orders placed or performed during the hospital encounter of 02/11/17  Comprehensive metabolic panel  Result Value Ref Range   Sodium 127 (L) 135 - 145 mmol/L   Potassium 3.2 (L) 3.5 - 5.1 mmol/L   Chloride 93 (L) 101 - 111 mmol/L   CO2 25 22 - 32 mmol/L   Glucose, Bld 174 (H) 65 - 99 mg/dL   BUN 12 6 - 20 mg/dL   Creatinine, Ser 0.67 0.61 - 1.24 mg/dL   Calcium 9.3 8.9 - 10.3 mg/dL   Total Protein 6.7 6.5 - 8.1 g/dL   Albumin 2.6 (L) 3.5 - 5.0 g/dL   AST 34 15 - 41 U/L   ALT 27 17 - 63 U/L   Alkaline Phosphatase 133 (H) 38 - 126 U/L   Total Bilirubin 0.6 0.3 - 1.2 mg/dL   GFR calc non Af Amer >60 >60 mL/min   GFR calc Af Amer >60 >60 mL/min   Anion gap 9 5 - 15  CBC with Differential  Result Value Ref Range   WBC 15.3 (H) 4.0 - 10.5 K/uL   RBC 4.17 (L) 4.22 - 5.81 MIL/uL   Hemoglobin 10.5 (L) 13.0 - 17.0 g/dL   HCT 32.0 (L) 39.0 - 52.0 %   MCV 76.7 (L) 78.0 - 100.0 fL   MCH 25.2 (L) 26.0 - 34.0 pg   MCHC 32.8 30.0 - 36.0 g/dL   RDW 16.2 (H) 11.5 -  15.5 %   Platelets 244 150 - 400 K/uL   Neutrophils  Relative % 75 %   Neutro Abs 11.4 (H) 1.7 - 7.7 K/uL   Lymphocytes Relative 13 %   Lymphs Abs 1.9 0.7 - 4.0 K/uL   Monocytes Relative 12 %   Monocytes Absolute 1.9 (H) 0.1 - 1.0 K/uL   Eosinophils Relative 0 %   Eosinophils Absolute 0.1 0.0 - 0.7 K/uL   Basophils Relative 0 %   Basophils Absolute 0.0 0.0 - 0.1 K/uL  Protime-INR  Result Value Ref Range   Prothrombin Time 15.7 (H) 11.4 - 15.2 seconds   INR 1.24   APTT  Result Value Ref Range   aPTT 39 (H) 24 - 36 seconds  Brain natriuretic peptide  Result Value Ref Range   B Natriuretic Peptide 121.2 (H) 0.0 - 100.0 pg/mL  Troponin I  Result Value Ref Range   Troponin I 0.05 (HH) <0.03 ng/mL   Laboratory interpretation all normal except + troponin, stable anemia, Leukocytosis, hyponatremia, hypokalemia, hyperglycemia    EKG  EKG Interpretation None     ED ECG REPORT   Date: 02/12/2017  Rate: 105  Rhythm: sinus tachycardia  QRS Axis: normal  Intervals: normal  ST/T Wave abnormalities: nonspecific ST changes  Conduction Disutrbances:none  Narrative Interpretation:   Old EKG Reviewed: none available  I have personally reviewed the EKG tracing and agree with the computerized printout as noted.   Radiology Ct Angio Chest Pe W/cm &/or Wo Cm  Result Date: 02/12/2017 CLINICAL DATA:  Initial evaluation for acute dyspnea on exertion, tachycardia. History of hepatocellular carcinoma. EXAM: CT ANGIOGRAPHY CHEST WITH CONTRAST TECHNIQUE: Multidetector CT imaging of the chest was performed using the standard protocol during bolus administration of intravenous contrast. Multiplanar CT image reconstructions and MIPs were obtained to evaluate the vascular anatomy. CONTRAST:  100 cc of Isovue 370. COMPARISON:  Prior CT from 12/17/2016. FINDINGS: Cardiovascular: Intrathoracic aorta of normal caliber without acute abnormality. Visualized great vessels within normal limits. Right-sided  Port-A-Cath in place. Heart size within normal limits. No pericardial effusion. Pulmonary arterial tree adequately opacified for evaluation. Main pulmonary artery within normal limits for size measuring 2.9 cm in diameter. There is scattered nonocclusive thrombus involving segmental pulmonary artery supplying the right lower lobe (series 6, image 133). Additional scattered nonocclusive thrombus within segmental lingular branches (series 6, image 142). Possible additional minimal clot within segmental left upper lobe branches (series 10, image 61) no other significant clot identified. RV to LV ratio mildly elevated at 1.0. Mild straightening of the intraventricular septum. Mediastinum/Nodes: Few scattered subcentimeter hypodense nodules noted within the thyroid, of doubtful significance. Bulky right paratracheal adenopathy measuring 3.5 x 3.0 cm (series 6, image 90). Additional bulky precarinal nodes measure approximately 2.2 x 4.9 cm. Large subcarinal nodal conglomerate measures 4.4 x 7.5 x 12.0 cm. This extends from the subcarinal region inferiorly towards the GE junction. Secondary mild mass effect on the left atrium which is flattened posteriorly. This extends inferiorly towards the GE junction along the anterior margin of the aorta. Scattered periaortic nodes present posteriorly measuring up to 1 cm (series 6, image 207). Bulky right hilar node measures 2.3 x 2.4 cm (series 6, image 135). Lungs/Pleura: Tracheobronchial tree is patent. Right hemidiaphragm is elevated with associated right basilar atelectasis. Irregular lobular bulging of the right hemidiaphragm from the subjacent liver (series 11, image 65). 4 mm right lower lobe nodule (series 7, image 43). Additional 3 mm right upper lobe nodule (series 7, image 44). 6 mm subpleural left upper lobe nodule (series 7, image 28). Additional 4  mm subpleural left upper lobe nodule (series 7, image 24). Few additional scattered right middle lobe nodules measure up to  8 mm (series 5, image 55). No focal infiltrates.  No pleural effusion.  No pneumothorax. Upper Abdomen: Large heterogeneous mass involving the right hepatic lobe again seen, compatible with history of hepatocellular carcinoma. Although direct measurements are somewhat difficult due to the infiltrative nature of this lesion, this appears increased in size from previous. Remainder the visualized upper abdomen otherwise unremarkable. Musculoskeletal: No acute osseous abnormality. No worrisome lytic or blastic osseous lesions. Review of the MIP images confirms the above findings. IMPRESSION: 1. Scattered acute pulmonary emboli involving bilateral segmental pulmonary arteries as above, with greatest involvement within the right lower lobe. Positive for acute PE with CT evidence of right heart strain (RV/LV Ratio = 1.0) consistent with at least submassive (intermediate risk) PE. The presence of right heart strain has been associated with an increased risk of morbidity and mortality. Please activate Code PE by paging [phone]. 2. Large infiltrative heterogeneous right hepatic lobe mass, compatible with history of hepatocellular carcinoma, suspected to be increased in size from previous. 3. Extensive bulky mediastinal and right hilar adenopathy as above, compatible with nodal metastases. 4. Scattered bilateral pulmonary nodules as above, several of which are new from previous, and may also reflect metastatic disease. Attention at follow-up recommended. Critical Value/emergent results were called by telephone at the time of interpretation on 02/12/2017 at 3:04 am to Dr. Rolland Porter , who verbally acknowledged these results. Electronically Signed   By: Jeannine Boga M.D.   On: 02/12/2017 03:08   Dg Chest Port 1 View  Result Date: 02/12/2017 CLINICAL DATA:  Fever, nausea and weakness tonight. Patient on chemotherapy for liver cancer. EXAM: PORTABLE CHEST 1 VIEW COMPARISON:  CXR from 02/27/2004 and addendum CT abdomen  from 12/17/2016 FINDINGS: Elevated appearance of the right hemidiaphragm unchanged from prior CT of the abdomen likely secondary to an enlarged right hepatic lobe mass seen on CT beneath the right hemidiaphragm. This is not believed to due to a subpulmonic effusion. Left lung is clear. There is mild interstitial edema. Port catheter tip is seen in the distal SVC. No pulmonary consolidation. No pneumothorax. The nodular densities seen prior CT within the right middle lobe upper not radiographically apparent. However there may be a 6 mm nodular density in the left upper lobe. No suspicious osseous lesions. IMPRESSION: Mild interstitial edema. Possible 6 mm nodular density in the left upper lobe versus a summation vascular shadows. Elevated right hemidiaphragm likely from known underlying hepatic mass. Electronically Signed   By: Ashley Royalty M.D.   On: 02/12/2017 01:12    Study Result   CLINICAL DATA:  Hepatocellular carcinoma.  Radicular low back pain.  EXAM: MRI LUMBAR SPINE WITHOUT CONTRAST  TECHNIQUE: Multiplanar, multisequence MR imaging of the lumbar spine was performed. No intravenous contrast was administered.  COMPARISON:  CT the abdomen and pelvis 01/23/2017  FINDINGS: Segmentation: 5 non rib-bearing lumbar type vertebral bodies are present.  Alignment:  AP alignment is anatomic.  Vertebrae: Marrow signal is diffusely compressed, compatible with chronic anemia  Conus medullaris: Extends to the L1 level and appears normal.  Paraspinal and other soft tissues: Limited imaging of the abdomen demonstrates a benign cyst in the left kidney measuring 15 mm. The treated right hepatic mass is not well seen.  Disc levels:  L1-2:  Negative.  L2-3: A broad-based disc protrusion is present. There is a central annular tear. Mild subarticular narrowing is  worse on the left. There is no significant foraminal disease.  L3-4:  Negative.  L4-5: A broad-based disc protrusion  is asymmetric to the right. Mild subarticular narrowing is present on the right. Moderate right and mild left foraminal stenosis is present.  L5-S1: A leftward disc protrusion is present. Asymmetric left-sided facet hypertrophy is noted. This results in moderate left subarticular and foraminal stenosis. The right foramen is patent.  IMPRESSION: 1. Broad-based disc protrusion and central annular tear at L2-3 with mild subarticular narrowing, worse on the left. 2. Far right lateral disc protrusion at L4-5 with mild right-sided subarticular narrowing, moderate right, and mild left foraminal stenosis. 3. Leftward disc protrusion at L5-S1 with moderate left subarticular and foraminal narrowing.   Electronically Signed   By: San Morelle M.D.   On: 02/04/2017 13:50      Procedures Procedures (including critical care time)  Medications Ordered in ED Medications  heparin bolus via infusion 4,000 Units (not administered)  heparin ADULT infusion 100 units/mL (25000 units/214mL sodium chloride 0.45%) (not administered)  sodium chloride 0.9 % bolus 1,000 mL (0 mLs Intravenous Stopped 02/12/17 0138)  sodium chloride 0.9 % bolus 500 mL (500 mLs Intravenous New Bag/Given 02/12/17 0138)  fentaNYL (SUBLIMAZE) injection 50 mcg (50 mcg Intravenous Given 02/12/17 0038)  iopamidol (ISOVUE-370) 76 % injection 100 mL (100 mLs Intravenous Contrast Given 02/12/17 0203)     Initial Impression / Assessment and Plan / ED Course  I have reviewed the triage vital signs and the nursing notes.  Pertinent labs & imaging results that were available during my care of the patient were reviewed by me and considered in my medical decision making (see chart for details).    COORDINATION OF CARE: 12:23 AM- Pt advised of plan for treatment and pt agrees. Labs and CXR ordered.   1:26 AM- Discussed with pt test results and need for CT scan for Pulmonary Embolism. He is high risk with his history of  cancer. Pt understands and agrees with plan.  03:05 AM radiologist called CT results.   Pt started on IV heparin.  03:15 AM patient and family informed of his CT results regarding the PE. Pulse ox 92-95 % on RA  03:30 AM Dr Blaine Hamper, hospitalist, will admit  Final Clinical Impressions(s) / ED Diagnoses   Final diagnoses:  DOE (dyspnea on exertion)  Other acute pulmonary embolism with acute cor pulmonale ([place])    Plan admission  Rolland Porter, MD, FACEP  I personally performed the services described in this documentation, which was scribed in my presence. The recorded information has been reviewed and considered.  Rolland Porter, MD, Barbette Or, MD 02/12/17 [phone]

## 2017-02-12 NOTE — Progress Notes (Signed)
  Echocardiogram 2D Echocardiogram has been performed.  [name] M 02/12/2017, 2:03 PM

## 2017-02-12 NOTE — ED Notes (Signed)
Talked with Dr. Eliane Decree about troponin 0.05 also informed [facility].

## 2017-02-12 NOTE — Progress Notes (Signed)
Progress note  Patient admitted early this morning. See h&p. Admits to progressive SOB over the past week, no chest pain, some dry cough, no peripheral edema or pain. CTA chest w acute PE with CT evidence of right heart strain. Currently hemodynamically stable on room air.   Echo pending US doppler pending Heparin gtt Oncology aware of pt admission  Dessa Phi, DO [facility] [email or web address] Password [email or web address] 02/12/2017, 8:05 AM

## 2017-02-12 NOTE — Care Management Note (Signed)
Case Management Note  Patient Details  Name: Justin [name] MRN: [id number] Date of Birth: [birth year]            Action/Plan:  Bilateral Pul. Emboli scattered and rt heart strain.   Expected Discharge Date:  02/15/17               Expected Discharge Plan:  Home/Self Care  In-House Referral:     Discharge planning Services     Post Acute Care Choice:    Choice offered to:     DME Arranged:    DME Agency:     HH Arranged:    HH Agency:     Status of Service:  Completed, signed off  If discussed at [employer] of Stay Meetings, dates discussed:    Additional Comments:Date:  February 12, 2017 Chart reviewed for concurrent status and case management needs. Will continue to follow patient progress. Discharge Planning: following for needs Expected discharge date: [id number] Velva Harman, BSN, [place], [place]  Leeroy Cha, RN 02/12/2017, 10:08 AM

## 2017-02-13 DIAGNOSIS — D72829 Elevated white blood cell count, unspecified: Secondary | ICD-10-CM

## 2017-02-13 DIAGNOSIS — R590 Localized enlarged lymph nodes: Secondary | ICD-10-CM

## 2017-02-13 DIAGNOSIS — C220 Liver cell carcinoma: Secondary | ICD-10-CM

## 2017-02-13 DIAGNOSIS — E119 Type 2 diabetes mellitus without complications: Secondary | ICD-10-CM

## 2017-02-13 DIAGNOSIS — R229 Localized swelling, mass and lump, unspecified: Secondary | ICD-10-CM

## 2017-02-13 DIAGNOSIS — I10 Essential (primary) hypertension: Secondary | ICD-10-CM

## 2017-02-13 DIAGNOSIS — I2699 Other pulmonary embolism without acute cor pulmonale: Secondary | ICD-10-CM

## 2017-02-13 DIAGNOSIS — R05 Cough: Secondary | ICD-10-CM

## 2017-02-13 DIAGNOSIS — B181 Chronic viral hepatitis B without delta-agent: Secondary | ICD-10-CM

## 2017-02-13 LAB — BASIC METABOLIC PANEL
Anion gap: 8 (ref 5–15)
BUN: 11 mg/dL (ref 6–20)
CHLORIDE: 104 mmol/L (ref 101–111)
CO2: 25 mmol/L (ref 22–32)
Calcium: 9.1 mg/dL (ref 8.9–10.3)
Creatinine, Ser: 0.69 mg/dL (ref 0.61–1.24)
GFR calc non Af Amer: >60 mL/min (ref >60)
GLUCOSE: 104 mg/dL — AB (ref 65–99)
Potassium: 3.6 mmol/L (ref 3.5–5.1)
SODIUM: 137 mmol/L (ref 135–145)

## 2017-02-13 LAB — HEPARIN LEVEL (UNFRACTIONATED)
HEPARIN UNFRACTIONATED: 0.2 [IU]/mL — AB (ref 0.30–0.70)
HEPARIN UNFRACTIONATED: 0.36 [IU]/mL (ref 0.30–0.70)
Heparin Unfractionated: 0.3 IU/mL (ref 0.30–0.70)

## 2017-02-13 LAB — GLUCOSE, CAPILLARY
GLUCOSE-CAPILLARY: 163 mg/dL — AB (ref 65–99)
Glucose-Capillary: 97 mg/dL (ref 65–99)

## 2017-02-13 LAB — CBC
HEMATOCRIT: 32.7 % — AB (ref 39.0–52.0)
HEMOGLOBIN: 10.5 g/dL — AB (ref 13.0–17.0)
MCH: 25 pg — AB (ref 26.0–34.0)
MCHC: 32.1 g/dL (ref 30.0–36.0)
MCV: 77.9 fL — ABNORMAL LOW (ref 78.0–100.0)
Platelets: 293 10*3/uL (ref 150–400)
RBC: 4.2 MIL/uL — ABNORMAL LOW (ref 4.22–5.81)
RDW: 16.3 % — ABNORMAL HIGH (ref 11.5–15.5)
WBC: 18.4 10*3/uL — ABNORMAL HIGH (ref 4.0–10.5)

## 2017-02-13 LAB — TROPONIN I: Troponin I: 0.08 ng/mL (ref <0.03)

## 2017-02-13 MED ORDER — BOOST / RESOURCE BREEZE PO LIQD
1.0000 | Freq: Every day | ORAL | Status: DC
  Administered 2017-02-13: 1 via ORAL

## 2017-02-13 MED ORDER — HEPARIN (PORCINE) IN NACL 100-0.45 UNIT/ML-% IJ SOLN
1750.0000 [IU]/h | INTRAMUSCULAR | Status: DC
  Filled 2017-02-13: qty 250

## 2017-02-13 MED ORDER — APIXABAN 5 MG PO TABS
10.0000 mg | ORAL_TABLET | Freq: Two times a day (BID) | ORAL | Status: DC
  Administered 2017-02-14: 10 mg via ORAL
  Filled 2017-02-13: qty 2

## 2017-02-13 MED ORDER — ENSURE ENLIVE PO LIQD
237.0000 mL | Freq: Two times a day (BID) | ORAL | Status: DC
  Administered 2017-02-13 – 2017-02-14 (×2): 237 mL via ORAL

## 2017-02-13 MED ORDER — SODIUM CHLORIDE 0.9 % IV BOLUS (SEPSIS)
1000.0000 mL | Freq: Once | INTRAVENOUS | Status: AC
  Administered 2017-02-13: 1000 mL via INTRAVENOUS

## 2017-02-13 MED ORDER — ADULT MULTIVITAMIN W/MINERALS CH
1.0000 | ORAL_TABLET | Freq: Every day | ORAL | Status: DC
  Administered 2017-02-13 – 2017-02-14 (×2): 1 via ORAL
  Filled 2017-02-13 (×3): qty 1

## 2017-02-13 MED ORDER — HEPARIN (PORCINE) IN NACL 100-0.45 UNIT/ML-% IJ SOLN
1750.0000 [IU]/h | INTRAMUSCULAR | Status: DC
  Filled 2017-02-13 (×2): qty 250

## 2017-02-13 MED ORDER — FAMOTIDINE 20 MG PO TABS
20.0000 mg | ORAL_TABLET | Freq: Two times a day (BID) | ORAL | Status: DC
  Administered 2017-02-13 – 2017-02-14 (×3): 20 mg via ORAL
  Filled 2017-02-13 (×3): qty 1

## 2017-02-13 MED ORDER — HEPARIN (PORCINE) IN NACL 100-0.45 UNIT/ML-% IJ SOLN
1900.0000 [IU]/h | INTRAMUSCULAR | Status: AC
  Filled 2017-02-13 (×2): qty 250

## 2017-02-13 NOTE — Progress Notes (Signed)
IP PROGRESS NOTE  Subjective:   He presented emergency room with shortness of breath on 02/12/2017 with shortness of breath. A CT of the chest revealed right lower lobe, lingular, and left upper lobe pulmonary emboli. Bulky right paratracheal and carinal adenopathy. Bulky right hilar node. Multiple small pulmonary nodules. Large mass involving the right liver.  He was admitted and placed on heparin anticoagulation. He reports feeling partially improved. He continues to have a cough and abdominal pain. He has noted multiple cutaneous nodules.  Objective: Vital signs in last 24 hours: Blood pressure 133/87, pulse (!) 132, temperature 98.3 F (36.8 C), temperature source Oral, resp. rate (!) 29, height 5\' 7"  (1.702 m), weight 149 lb 11.1 oz (67.9 kg), SpO2 95 %.  Intake/Output from previous day: 03/07 0701 - 03/08 0700 In: 855 [P.O.:480; I.V.:375] Out: -   Physical Exam:  HEENT: No thrush Lungs: Clear bilaterally Cardiac: Regular rate and rhythm, tachycardia Abdomen: No hepatosplenomegaly Extremities: No leg edema Skin: Multiple subcutaneous masses included masses at the right forearm, left mid back, right suprapubic area, and right mid abdomen  Portacath/PICC-without erythema  Lab Results:  Recent Labs  02/12/17 0959 02/13/17 0450  WBC 16.0* 18.4*  HGB 10.6* 10.5*  HCT 32.0* 32.7*  PLT 262 293    BMET  Recent Labs  02/12/17 0959 02/13/17 0450  NA 136 137  K 3.4* 3.6  CL 103 104  CO2 27 25  GLUCOSE 128* 104*  BUN 9 11  CREATININE 0.71 0.69  CALCIUM 9.0 9.1    Studies/Results: Ct Angio Chest Pe W/cm &/or Wo Cm  Result Date: 02/12/2017 CLINICAL DATA:  Initial evaluation for acute dyspnea on exertion, tachycardia. History of hepatocellular carcinoma. EXAM: CT ANGIOGRAPHY CHEST WITH CONTRAST TECHNIQUE: Multidetector CT imaging of the chest was performed using the standard protocol during bolus administration of intravenous contrast. Multiplanar CT image  reconstructions and MIPs were obtained to evaluate the vascular anatomy. CONTRAST:  100 cc of Isovue 370. COMPARISON:  Prior CT from 12/17/2016. FINDINGS: Cardiovascular: Intrathoracic aorta of normal caliber without acute abnormality. Visualized great vessels within normal limits. Right-sided Port-A-Cath in place. Heart size within normal limits. No pericardial effusion. Pulmonary arterial tree adequately opacified for evaluation. Main pulmonary artery within normal limits for size measuring 2.9 cm in diameter. There is scattered nonocclusive thrombus involving segmental pulmonary artery supplying the right lower lobe (series 6, image 133). Additional scattered nonocclusive thrombus within segmental lingular branches (series 6, image 142). Possible additional minimal clot within segmental left upper lobe branches (series 10, image 61) no other significant clot identified. RV to LV ratio mildly elevated at 1.0. Mild straightening of the intraventricular septum. Mediastinum/Nodes: Few scattered subcentimeter hypodense nodules noted within the thyroid, of doubtful significance. Bulky right paratracheal adenopathy measuring 3.5 x 3.0 cm (series 6, image 90). Additional bulky precarinal nodes measure approximately 2.2 x 4.9 cm. Large subcarinal nodal conglomerate measures 4.4 x 7.5 x 12.0 cm. This extends from the subcarinal region inferiorly towards the GE junction. Secondary mild mass effect on the left atrium which is flattened posteriorly. This extends inferiorly towards the GE junction along the anterior margin of the aorta. Scattered periaortic nodes present posteriorly measuring up to 1 cm (series 6, image 207). Bulky right hilar node measures 2.3 x 2.4 cm (series 6, image 135). Lungs/Pleura: Tracheobronchial tree is patent. Right hemidiaphragm is elevated with associated right basilar atelectasis. Irregular lobular bulging of the right hemidiaphragm from the subjacent liver (series 11, image 65). 4 mm right  lower  lobe nodule (series 7, image 43). Additional 3 mm right upper lobe nodule (series 7, image 44). 6 mm subpleural left upper lobe nodule (series 7, image 28). Additional 4 mm subpleural left upper lobe nodule (series 7, image 24). Few additional scattered right middle lobe nodules measure up to 8 mm (series 5, image 55). No focal infiltrates.  No pleural effusion.  No pneumothorax. Upper Abdomen: Large heterogeneous mass involving the right hepatic lobe again seen, compatible with history of hepatocellular carcinoma. Although direct measurements are somewhat difficult due to the infiltrative nature of this lesion, this appears increased in size from previous. Remainder the visualized upper abdomen otherwise unremarkable. Musculoskeletal: No acute osseous abnormality. No worrisome lytic or blastic osseous lesions. Review of the MIP images confirms the above findings. IMPRESSION: 1. Scattered acute pulmonary emboli involving bilateral segmental pulmonary arteries as above, with greatest involvement within the right lower lobe. Positive for acute PE with CT evidence of right heart strain (RV/LV Ratio = 1.0) consistent with at least submassive (intermediate risk) PE. The presence of right heart strain has been associated with an increased risk of morbidity and mortality. Please activate Code PE by paging [phone]. 2. Large infiltrative heterogeneous right hepatic lobe mass, compatible with history of hepatocellular carcinoma, suspected to be increased in size from previous. 3. Extensive bulky mediastinal and right hilar adenopathy as above, compatible with nodal metastases. 4. Scattered bilateral pulmonary nodules as above, several of which are new from previous, and may also reflect metastatic disease. Attention at follow-up recommended. Critical Value/emergent results were called by telephone at the time of interpretation on 02/12/2017 at 3:04 am to Dr. Rolland Porter , who verbally acknowledged these results.  Electronically Signed   By: Jeannine Boga M.D.   On: 02/12/2017 03:08   Dg Chest Port 1 View  Result Date: 02/12/2017 CLINICAL DATA:  Fever, nausea and weakness tonight. Patient on chemotherapy for liver cancer. EXAM: PORTABLE CHEST 1 VIEW COMPARISON:  CXR from 02/27/2004 and addendum CT abdomen from 12/17/2016 FINDINGS: Elevated appearance of the right hemidiaphragm unchanged from prior CT of the abdomen likely secondary to an enlarged right hepatic lobe mass seen on CT beneath the right hemidiaphragm. This is not believed to due to a subpulmonic effusion. Left lung is clear. There is mild interstitial edema. Port catheter tip is seen in the distal SVC. No pulmonary consolidation. No pneumothorax. The nodular densities seen prior CT within the right middle lobe upper not radiographically apparent. However there may be a 6 mm nodular density in the left upper lobe. No suspicious osseous lesions. IMPRESSION: Mild interstitial edema. Possible 6 mm nodular density in the left upper lobe versus a summation vascular shadows. Elevated right hemidiaphragm likely from known underlying hepatic mass. Electronically Signed   By: Ashley Royalty M.D.   On: 02/12/2017 01:12    Medications: I have reviewed the patient's current medications.  Assessment/Plan: 1. Hepatocellular carcinoma ? MRI abdomen 12/12/2016 consistent with a right hepatic HCC measuring 12.3 x 12.7 cm ? Markedly elevated AFP ? History of hepatitis B ? CTs chest, abdomen, and pelvis 12/17/2016-large right liver mass involving segments 7 and 8, possible involvement of the left liver. Nonspecific right lung nodules, nonspecific porta hepatis nodes, 1.5 cm right lower paraesophageal mediastinal node ? EUS biopsy of the paraesophageal lymph node on 12/26/2016 confirms metastatic hepatocellular carcinoma  ? CT abdomen and pelvis 01/23/2017 with marked progression of the right liver mass, hilar/esophageal lymph nodes, omentum, pancreas head, and  subcutaneous nodules ? Cycle 1  gemcitabine/oxaliplatin 01/31/2017  2. Chronic hepatitis B infection  3. Fever  4. Nonproductive cough  5. Anorexia/weight loss  6. Microcytic anemia-likely iron deficiency as the red cell microcytosis is new. Stool Hemoccult cards negative on 01/22/2017  7. Hypertension-amlodipine dose increased to 10 mg 01/16/2017  8. Hypercalcemia of malignancy 01/23/2017-Zometa given 01/23/2017  9.  Bilateral pulmonary embolism 02/12/2017-heparin initiated  Lower extremity Dopplers 02/12/2017-negative for deep vein thrombosis  He is now at day 14 following cycle 1 gemcitabine/oxaliplatin chemotherapy for treatment of metastatic hepatocellular carcinoma. He tolerated the chemotherapy well, but continues to have a poor performance status. I am concerned the cutaneous nodules may be progressing. He is scheduled for the second cycle of chemotherapy on 02/14/2017. This will be placed on hold and rescheduled for next week.  He is now receiving heparin anticoagulation for the pulmonary embolism. I think it will be okay to transition to Xarelto if his clinical status is stable tomorrow.  Recommendations: 1. Continue heparin anticoagulation, consider change a DOAC over next few days 2. Narcotic analgesics as needed for pain 3. Outpatient follow-up will be scheduled at the [facility] for the week of 02/17/2017.    LOS: 1 day   Betsy Coder, MD   02/13/2017, 5:13 PM

## 2017-02-13 NOTE — Progress Notes (Signed)
[place] for IV heparin Indication: pulmonary embolus  Allergies  Allergen Reactions  . Lisinopril Other (See Comments)    coughing   Patient Measurements: Height: 5\' 7"  (170.2 cm) Weight: 149 lb 11.1 oz (67.9 kg) (something wrong with bed. RN instructed me to use 3-7 wt) IBW/kg (Calculated) : 66.1 Heparin Dosing Weight: 68.9  Vital Signs: Temp: 98.3 F (36.8 C) (03/08 1200) Temp Source: Oral (03/08 1200) BP: 129/87 (03/08 1200) Pulse Rate: 131 (03/08 1500)  Labs:  Recent Labs  02/12/17 0031 02/12/17 0959  02/12/17 1318 02/12/17 2052 02/12/17 2300 02/13/17 0450 02/13/17 1441  HGB 10.5* 10.6*  --   --   --   --  10.5*  --   HCT 32.0* 32.0*  --   --   --   --  32.7*  --   PLT 244 262  --   --   --   --  293  --   APTT 39*  --   --   --   --   --   --   --   LABPROT 15.7*  --   --   --   --   --   --   --   INR 1.24  --   --   --   --   --   --   --   HEPARINUNFRC  --   --   < > <0.10* 0.30  --  0.30 0.20*  CREATININE 0.67 0.71  --   --   --   --  0.69  --   TROPONINI 0.05* 0.06*  --  0.05*  --  0.08*  --   --   < > = values in this interval not displayed. Estimated Creatinine Clearance: 94.1 mL/min (by C-G formula based on SCr of 0.69 mg/dL).  Medical History: Past Medical History:  Diagnosis Date  . Cancer ([place])   . Chronic hepatitis B virus infection ([place]) 02/15/2004   Annotation: 02/15/2004:  +Core Ab, +SAg, -SAb, -Be Ag with elevated liver  enzymes HBV DNA via PCR:  <2.3 AFP normal Evaluated by [facility] 07/24/2004--Dr. Patsy [place] Qualifier: Diagnosis of  By: Amil Amen MD, Benjamine Mola    . Diabetes mellitus without complication (HCC)    no meds  . High cholesterol 2006  . Hyperlipidemia   . Hypertension   . Prediabetes 08/31/2016   Lab Results Component Value Date  HGBA1C 6.2 (H) 08/30/2015     Medications:  Scheduled:  . [START ON 02/14/2017] apixaban  10 mg Oral BID  . Chlorhexidine Gluconate Cloth  6 each Topical  Daily  . famotidine  20 mg Oral BID  . feeding supplement  1 Container Oral Daily  . feeding supplement (ENSURE ENLIVE)  237 mL Oral BID BM  . ferrous sulfate  325 mg Oral BID WC  . multivitamin with minerals  1 tablet Oral Daily  . sodium chloride  1,000 mL Intravenous Once  . sodium chloride flush  3 mL Intravenous Q12H   Infusions:  . heparin     Assessment: 37 yoM c/o generalized malaise, weakness, acute dyspnea on exertion found to have acute PE on CT with right heart strain.   Today, 02/13/2017   Heparin level in PM is 0.20, subtherapeutic  No bleeding or infusion interruptions reported.  H/H stable. Pltc WNL.  Goal of Therapy:  Heparin level 0.3-0.7 units/ml Monitor platelets by anticoagulation protocol: Yes   Plan:    Increase  Heparin infusion to 1900 units/hr  Recheck heparin level 6 hours after rate change   Heparin is scheduled to end tomorrow due to start of apixaban  Daily CBC/HL   Royetta Asal, PharmD, BCPS Pager [phone] 02/13/2017 4:30 PM   .

## 2017-02-13 NOTE — Progress Notes (Signed)
[place] for IV heparin Indication: pulmonary embolus  Allergies  Allergen Reactions  . Lisinopril Other (See Comments)    coughing   Patient Measurements: Height: 5\' 7"  (170.2 cm) Weight: 149 lb 11.1 oz (67.9 kg) (something wrong with bed. RN instructed me to use 3-7 wt) IBW/kg (Calculated) : 66.1 Heparin Dosing Weight: 68.9  Vital Signs: Temp: 98.4 F (36.9 C) (03/08 0035) Temp Source: Oral (03/08 0035) BP: 129/85 (03/08 0600) Pulse Rate: 127 (03/08 0600)  Labs:  Recent Labs  02/12/17 0031 02/12/17 0959 02/12/17 1318 02/12/17 2052 02/12/17 2300 02/13/17 0450  HGB 10.5* 10.6*  --   --   --  10.5*  HCT 32.0* 32.0*  --   --   --  32.7*  PLT 244 262  --   --   --  293  APTT 39*  --   --   --   --   --   LABPROT 15.7*  --   --   --   --   --   INR 1.24  --   --   --   --   --   HEPARINUNFRC  --   --  <0.10* 0.30  --  0.30  CREATININE 0.67 0.71  --   --   --  0.69  TROPONINI 0.05* 0.06* 0.05*  --  0.08*  --    Estimated Creatinine Clearance: 94.1 mL/min (by C-G formula based on SCr of 0.69 mg/dL).  Medical History: Past Medical History:  Diagnosis Date  . Cancer ([place])   . Chronic hepatitis B virus infection ([place]) 02/15/2004   Annotation: 02/15/2004:  +Core Ab, +SAg, -SAb, -Be Ag with elevated liver  enzymes HBV DNA via PCR:  <2.3 AFP normal Evaluated by [facility] 07/24/2004--Dr. Patsy [place] Qualifier: Diagnosis of  By: Amil Amen MD, Benjamine Mola    . Diabetes mellitus without complication (HCC)    no meds  . High cholesterol 2006  . Hyperlipidemia   . Hypertension   . Prediabetes 08/31/2016   Lab Results Component Value Date  HGBA1C 6.2 (H) 08/30/2015     Medications:  Scheduled:  . amLODipine  10 mg Oral Daily  . aspirin EC  81 mg Oral Daily  . Chlorhexidine Gluconate Cloth  6 each Topical Daily  . feeding supplement (ENSURE ENLIVE)  237 mL Oral BID BM  . ferrous sulfate  325 mg Oral BID WC  . sodium chloride flush  3  mL Intravenous Q12H   Infusions:  . heparin     Assessment: 51 yoM c/o generalized malaise, weakness, acute dyspnea on exertion found to have acute PE on CT with right heart strain.   Today, 02/13/2017  Heparin level remains at lower end of therapeutic range despite small rate increase last night to 1600 units/hr.  No bleeding or infusion interruptions reported. H/H stable. Pltc WNL.  Goal of Therapy:  Heparin level 0.3-0.7 units/ml Monitor platelets by anticoagulation protocol: Yes   Plan:   Increase Heparin infusion to 1750 units/hr Recheck heparin level at 2 pm Daily CBC/HL  Clayburn Pert, PharmD, BCPS Pager: [phone] 02/13/2017  7:31 AM   .

## 2017-02-13 NOTE — Progress Notes (Signed)
Initial Nutrition Assessment  DOCUMENTATION CODES:   Severe malnutrition in context of acute illness/injury  INTERVENTION:  - Continue Ensure Enlive BID, each supplement provides 350 kcal and 20 grams of protein - Will order Boost Breeze once/day, this supplement provides 250 kcal and 9 grams of protein - Will order multivitamin with minerals. - Continue to encourage PO intakes of meals and supplements.   NUTRITION DIAGNOSIS:   Increased nutrient needs related to catabolic illness, cancer and cancer related treatments as evidenced by estimated needs.  GOAL:   Patient will meet greater than or equal to 90% of their needs  MONITOR:   PO intake, Supplement acceptance, Weight trends, Labs  REASON FOR ASSESSMENT:   Malnutrition Screening Tool  ASSESSMENT:   56 y.o. 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  Pt seen for MST. BMI indicates normal weight. Per chart review, pt consumed 100% of breakfast yesterday AM. He states that he became very nauseated after bites of lunch yesterday and subsequently vomited. He did not eat dinner. He ate most of an omelet for breakfast this AM but has not yet ordered lunch as he is not feeling hungry and is feeling nauseated. Pt reports that he has been having ongoing nausea and intermittent vomiting with all PO intakes since starting chemo (first treatment was on 01/31/17) and that he has also been experiencing decreased appetite/decreased desire to eat since that time.   Pt's wife is at bedside and pt and wife ask about foods that would be better for pt to eat right now. He denies any foods making N/V better or worse. He denies experiencing any taste alterations or temperature sensitivities since starting chemo. Encouraged bland foods such as mashed potatoes, grilled chicken, fish, fresh fruit. Encouraged him to listen to his  body and if there is something that he enjoys or something that he tolerates well to make note of this. Wife asks about which medications he may be on that have an interaction with grapefruit; encouraged her to talk with MD about this.  Physical assessment performed to upper body only and shows mild fat and moderate muscle wasting. Pt reports UBW of 180 lbs and that he last weighed this ~1 year ago. Per chart review, pt has lost 15 lbs (9% body weight) in the past 2 months which is significant for time frame.   Medications reviewed; CBG: 163 mg/dL this AM.  Labs reviewed.    Diet Order:  Diet Heart Room service appropriate? Yes; Fluid consistency: Thin  Skin:  Reviewed, no issues  Last BM:  3/7  Height:   Ht Readings from Last 1 Encounters:  02/12/17 5\' 7"  (1.702 m)    Weight:   Wt Readings from Last 1 Encounters:  02/13/17 149 lb 11.1 oz (67.9 kg)    Ideal Body Weight:  67.27 kg  BMI:  Body mass index is 23.45 kg/m.  Estimated Nutritional Needs:   Kcal:  1829-9371 (33-36 kcal/kg)  Protein:  102-115 grams (1.5-1.7 grams/kg)  Fluid:  >/= 2 L/day  EDUCATION NEEDS:   Education needs addressed    Jarome Matin, MS, RD, LDN, CNSC Inpatient Clinical Dietitian Pager # [phone] After hours/weekend pager # [phone]

## 2017-02-13 NOTE — Progress Notes (Addendum)
PROGRESS NOTE    Justin [name]   ZOX:[id number]  DOB: [birth year]  DOA: 02/11/2017 PCP: Delman Cheadle, MD   Brief Narrative:   Justin [name] is a 56 y.o. male with HCV, hepatocellular CA with mets who has received first cycle on chemo recently and presents for weakness and dyspnea for about 1 wk. Found to have b/l PE.    Subjective: No dyspnea or cough. Stomach feels "tired" after he eats a little bit. No diarrhea, heart burn or pain. He goes on to explain that he feels full easily.   Assessment & Plan:   Principal Problem:   PE (pulmonary thromboembolism)- with mild elevated troponin - CT suggests right heart strain but ECHO reveals only grade 1 dCHF without any other abnormalities - no DVT - Dr Ammie Dalton recommends NOAC to be started tomorrow - has ambulated in hall without hypoxia- s tachycardic with HR in 130s when walking- transfer out of SDU  Active Problems:  Tahcycardia/ hypotension - If I and O are accurate, he has not had much oral fluid intake - complaint of early satiety may be associated with dehydration - will give NS 1 L now    Essential hypertension - hypotensive currently- stop Amlodipine - already holding Cozaar  Early satiety - try BID Pepcid- states he is not constipated and had a normal BM yesterday  DM 2 - A1c 7.1 in 12/30 - not on medications at this time    Hepatocellular carcinoma  /   Mediastinal adenopathy/ new subcutaneous nodules - management per Dr Ammie Dalton- he tells me that he plans on 1 more chemo and if no improvement, will discuss hospice   Leukocytosis - stress response- no symptoms or signs to suggest infection  DVT prophylaxis: Heparin infusion Code Status: Full code Family Communication: wife Disposition Plan: home tomorrow Consultants:   oncology Procedures:   2 d ECHO  LE venous duplex Antimicrobials:  Anti-infectives    None       Objective: Vitals:   02/13/17 0900 02/13/17 1000 02/13/17 1100 02/13/17 1200  BP:   112/90  129/87  Pulse: (!) 116 (!) 115 (!) 118 (!) 121  Resp: 18 (!) 24 (!) 21 (!) 23  Temp:    98.3 F (36.8 C)  TempSrc:    Oral  SpO2: 94% 94% 94% 94%  Weight:      Height:        Intake/Output Summary (Last 24 hours) at 02/13/17 1259 Last data filed at 02/13/17 1200  Gross per 24 hour  Intake            690.2 ml  Output                0 ml  Net            690.2 ml   Filed Weights   02/12/17 0612 02/13/17 0500  Weight: 68.9 kg (151 lb 14.4 oz) 67.9 kg (149 lb 11.1 oz)    Examination: General exam: Appears comfortable  HEENT: PERRLA, oral mucosa moist, no sclera icterus or thrush Respiratory system: Clear to auscultation. Respiratory effort normal. Cardiovascular system: S1 & S2 heard, RRR.  No murmurs + tachycardia with HR of 100-110 at rest Gastrointestinal system: Abdomen soft, non-tender, nondistended. Normal bowel sound. No organomegaly Central nervous system: Alert and oriented. No focal neurological deficits. Extremities: No cyanosis, clubbing or edema Skin: No rashes or ulcers Psychiatry:  Mood & affect appropriate.     Data Reviewed: I have personally reviewed following labs and imaging  studies  CBC:  Recent Labs Lab 02/12/17 0031 02/12/17 0959 02/13/17 0450  WBC 15.3* 16.0* 18.4*  NEUTROABS 11.4*  --   --   HGB 10.5* 10.6* 10.5*  HCT 32.0* 32.0* 32.7*  MCV 76.7* 77.1* 77.9*  PLT 244 262 161   Basic Metabolic Panel:  Recent Labs Lab 02/12/17 0031 02/12/17 0959 02/13/17 0450  NA 127* 136 137  K 3.2* 3.4* 3.6  CL 93* 103 104  CO2 25 27 25   GLUCOSE 174* 128* 104*  BUN 12 9 11   CREATININE 0.67 0.71 0.69  CALCIUM 9.3 9.0 9.1  MG  --  1.9  --    GFR: Estimated Creatinine Clearance: 94.1 mL/min (by C-G formula based on SCr of 0.69 mg/dL). Liver Function Tests:  Recent Labs Lab 02/12/17 0031  AST 34  ALT 27  ALKPHOS 133*  BILITOT 0.6  PROT 6.7  ALBUMIN 2.6*   No results for input(s): LIPASE, AMYLASE in the last 168 hours. No  results for input(s): AMMONIA in the last 168 hours. Coagulation Profile:  Recent Labs Lab 02/12/17 0031  INR 1.24   Cardiac Enzymes:  Recent Labs Lab 02/12/17 0031 02/12/17 0959 02/12/17 1318 02/12/17 2300  TROPONINI 0.05* 0.06* 0.05* 0.08*   BNP (last 3 results) No results for input(s): PROBNP in the last 8760 hours. HbA1C: No results for input(s): HGBA1C in the last 72 hours. CBG:  Recent Labs Lab 02/12/17 0538 02/12/17 0758 02/13/17 0451 02/13/17 0836  GLUCAP 127* 110* 97 163*   Lipid Profile: No results for input(s): CHOL, HDL, LDLCALC, TRIG, CHOLHDL, LDLDIRECT in the last 72 hours. Thyroid Function Tests:  Recent Labs  02/12/17 0959  TSH 3.011   Anemia Panel: No results for input(s): VITAMINB12, FOLATE, FERRITIN, TIBC, IRON, RETICCTPCT in the last 72 hours. Urine analysis:    Component Value Date/Time   COLORURINE dk. yellow 12/21/2010 0921   LABSPEC <1.005 01/18/2011 1006   PHURINE 6.5 01/18/2011 1006   HGBUR negative 01/18/2011 1006   BILIRUBINUR small (A) 12/07/2016 0855   KETONESUR negative 12/07/2016 0855   PROTEINUR =30 (A) 12/07/2016 0855   UROBILINOGEN 1.0 12/07/2016 0855   UROBILINOGEN 0.2 01/18/2011 1006   NITRITE Negative 12/07/2016 0855   NITRITE negative 01/18/2011 1006   LEUKOCYTESUR Negative 12/07/2016 0855   Sepsis Labs: @LABRCNTIP (procalcitonin:4,lacticidven:4) ) Recent Results (from the past 240 hour(s))  MRSA PCR Screening     Status: None   Collection Time: 02/12/17  4:51 AM  Result Value Ref Range Status   MRSA by PCR NEGATIVE NEGATIVE Final    Comment:        The GeneXpert MRSA Assay (FDA approved for NASAL specimens only), is one component of a comprehensive MRSA colonization surveillance program. It is not intended to diagnose MRSA infection nor to guide or monitor treatment for MRSA infections.          Radiology Studies: Ct Angio Chest Pe W/cm &/or Wo Cm  Result Date: 02/12/2017 CLINICAL DATA:   Initial evaluation for acute dyspnea on exertion, tachycardia. History of hepatocellular carcinoma. EXAM: CT ANGIOGRAPHY CHEST WITH CONTRAST TECHNIQUE: Multidetector CT imaging of the chest was performed using the standard protocol during bolus administration of intravenous contrast. Multiplanar CT image reconstructions and MIPs were obtained to evaluate the vascular anatomy. CONTRAST:  100 cc of Isovue 370. COMPARISON:  Prior CT from 12/17/2016. FINDINGS: Cardiovascular: Intrathoracic aorta of normal caliber without acute abnormality. Visualized great vessels within normal limits. Right-sided Port-A-Cath in place. Heart size within normal limits. No  pericardial effusion. Pulmonary arterial tree adequately opacified for evaluation. Main pulmonary artery within normal limits for size measuring 2.9 cm in diameter. There is scattered nonocclusive thrombus involving segmental pulmonary artery supplying the right lower lobe (series 6, image 133). Additional scattered nonocclusive thrombus within segmental lingular branches (series 6, image 142). Possible additional minimal clot within segmental left upper lobe branches (series 10, image 61) no other significant clot identified. RV to LV ratio mildly elevated at 1.0. Mild straightening of the intraventricular septum. Mediastinum/Nodes: Few scattered subcentimeter hypodense nodules noted within the thyroid, of doubtful significance. Bulky right paratracheal adenopathy measuring 3.5 x 3.0 cm (series 6, image 90). Additional bulky precarinal nodes measure approximately 2.2 x 4.9 cm. Large subcarinal nodal conglomerate measures 4.4 x 7.5 x 12.0 cm. This extends from the subcarinal region inferiorly towards the GE junction. Secondary mild mass effect on the left atrium which is flattened posteriorly. This extends inferiorly towards the GE junction along the anterior margin of the aorta. Scattered periaortic nodes present posteriorly measuring up to 1 cm (series 6, image 207).  Bulky right hilar node measures 2.3 x 2.4 cm (series 6, image 135). Lungs/Pleura: Tracheobronchial tree is patent. Right hemidiaphragm is elevated with associated right basilar atelectasis. Irregular lobular bulging of the right hemidiaphragm from the subjacent liver (series 11, image 65). 4 mm right lower lobe nodule (series 7, image 43). Additional 3 mm right upper lobe nodule (series 7, image 44). 6 mm subpleural left upper lobe nodule (series 7, image 28). Additional 4 mm subpleural left upper lobe nodule (series 7, image 24). Few additional scattered right middle lobe nodules measure up to 8 mm (series 5, image 55). No focal infiltrates.  No pleural effusion.  No pneumothorax. Upper Abdomen: Large heterogeneous mass involving the right hepatic lobe again seen, compatible with history of hepatocellular carcinoma. Although direct measurements are somewhat difficult due to the infiltrative nature of this lesion, this appears increased in size from previous. Remainder the visualized upper abdomen otherwise unremarkable. Musculoskeletal: No acute osseous abnormality. No worrisome lytic or blastic osseous lesions. Review of the MIP images confirms the above findings. IMPRESSION: 1. Scattered acute pulmonary emboli involving bilateral segmental pulmonary arteries as above, with greatest involvement within the right lower lobe. Positive for acute PE with CT evidence of right heart strain (RV/LV Ratio = 1.0) consistent with at least submassive (intermediate risk) PE. The presence of right heart strain has been associated with an increased risk of morbidity and mortality. Please activate Code PE by paging [phone]. 2. Large infiltrative heterogeneous right hepatic lobe mass, compatible with history of hepatocellular carcinoma, suspected to be increased in size from previous. 3. Extensive bulky mediastinal and right hilar adenopathy as above, compatible with nodal metastases. 4. Scattered bilateral pulmonary nodules  as above, several of which are new from previous, and may also reflect metastatic disease. Attention at follow-up recommended. Critical Value/emergent results were called by telephone at the time of interpretation on 02/12/2017 at 3:04 am to Dr. Rolland Porter , who verbally acknowledged these results. Electronically Signed   By: Jeannine Boga M.D.   On: 02/12/2017 03:08   Dg Chest Port 1 View  Result Date: 02/12/2017 CLINICAL DATA:  Fever, nausea and weakness tonight. Patient on chemotherapy for liver cancer. EXAM: PORTABLE CHEST 1 VIEW COMPARISON:  CXR from 02/27/2004 and addendum CT abdomen from 12/17/2016 FINDINGS: Elevated appearance of the right hemidiaphragm unchanged from prior CT of the abdomen likely secondary to an enlarged right hepatic lobe mass seen on CT  beneath the right hemidiaphragm. This is not believed to due to a subpulmonic effusion. Left lung is clear. There is mild interstitial edema. Port catheter tip is seen in the distal SVC. No pulmonary consolidation. No pneumothorax. The nodular densities seen prior CT within the right middle lobe upper not radiographically apparent. However there may be a 6 mm nodular density in the left upper lobe. No suspicious osseous lesions. IMPRESSION: Mild interstitial edema. Possible 6 mm nodular density in the left upper lobe versus a summation vascular shadows. Elevated right hemidiaphragm likely from known underlying hepatic mass. Electronically Signed   By: Ashley Royalty M.D.   On: 02/12/2017 01:12      Scheduled Meds: . Chlorhexidine Gluconate Cloth  6 each Topical Daily  . famotidine  20 mg Oral BID  . feeding supplement (ENSURE ENLIVE)  237 mL Oral BID BM  . ferrous sulfate  325 mg Oral BID WC  . sodium chloride flush  3 mL Intravenous Q12H   Continuous Infusions: . heparin 1,750 Units/hr (02/13/17 1200)     LOS: 1 day    Time spent in minutes: [place], MD [facility] Pager: [email or web address] Password  [facility] 02/13/2017, 12:59 PM

## 2017-02-13 NOTE — Progress Notes (Signed)
[place] for IV heparin Indication: pulmonary embolus  Allergies  Allergen Reactions  . Lisinopril Other (See Comments)    coughing   Patient Measurements: Height: 5\' 7"  (170.2 cm) Weight: 149 lb 11.1 oz (67.9 kg) (something wrong with bed. RN instructed me to use 3-7 wt) IBW/kg (Calculated) : 66.1 Heparin Dosing Weight: 68.9  Vital Signs: Temp: 99.3 F (37.4 C) (03/08 2022) Temp Source: Oral (03/08 2022) BP: 123/84 (03/08 2022) Pulse Rate: 131 (03/08 2022)  Labs:  Recent Labs  02/12/17 0031 02/12/17 0959 02/12/17 1318  02/12/17 2300 02/13/17 0450 02/13/17 1441 02/13/17 2306  HGB 10.5* 10.6*  --   --   --  10.5*  --   --   HCT 32.0* 32.0*  --   --   --  32.7*  --   --   PLT 244 262  --   --   --  293  --   --   APTT 39*  --   --   --   --   --   --   --   LABPROT 15.7*  --   --   --   --   --   --   --   INR 1.24  --   --   --   --   --   --   --   HEPARINUNFRC  --   --  <0.10*  < >  --  0.30 0.20* 0.36  CREATININE 0.67 0.71  --   --   --  0.69  --   --   TROPONINI 0.05* 0.06* 0.05*  --  0.08*  --   --   --   < > = values in this interval not displayed. Estimated Creatinine Clearance: 94.1 mL/min (by C-G formula based on SCr of 0.69 mg/dL).  Medical History: Past Medical History:  Diagnosis Date  . Cancer ([place])   . Chronic hepatitis B virus infection ([place]) 02/15/2004   Annotation: 02/15/2004:  +Core Ab, +SAg, -SAb, -Be Ag with elevated liver  enzymes HBV DNA via PCR:  <2.3 AFP normal Evaluated by [facility] 07/24/2004--Dr. Patsy [place] Qualifier: Diagnosis of  By: Amil Amen MD, Benjamine Mola    . Diabetes mellitus without complication ([facility])    no meds  . High cholesterol 2006  . Hyperlipidemia   . Hypertension   . Prediabetes 08/31/2016   Lab Results Component Value Date  HGBA1C 6.2 (H) 08/30/2015     Medications:  Scheduled:  . [START ON 02/14/2017] apixaban  10 mg Oral BID  . Chlorhexidine Gluconate Cloth  6 each Topical  Daily  . famotidine  20 mg Oral BID  . feeding supplement  1 Container Oral Daily  . feeding supplement (ENSURE ENLIVE)  237 mL Oral BID BM  . ferrous sulfate  325 mg Oral BID WC  . multivitamin with minerals  1 tablet Oral Daily  . sodium chloride flush  3 mL Intravenous Q12H   Infusions:  . heparin 1,900 Units/hr (02/13/17 1648)   Assessment: 39 yoM c/o generalized malaise, weakness, acute dyspnea on exertion found to have acute PE on CT with right heart strain.   Today, 02/13/2017   Heparin level 1442 is 0.20, subtherapeutic  No bleeding or infusion interruptions reported.  H/H stable. Pltc WNL.  2306 HL=0.36 at goal, no problems reported by nursing Goal of Therapy:  Heparin level 0.3-0.7 units/ml Monitor platelets by anticoagulation protocol: Yes   Plan:  Continue Heparin infusion at 1900 units/hr  Heparin is scheduled to end tomorrow due to start of apixaban  Daily CBC/HL   Dorrene German 02/13/2017 11:51 PM   .

## 2017-02-14 ENCOUNTER — Ambulatory Visit: Payer: 59 | Admitting: Oncology

## 2017-02-14 ENCOUNTER — Telehealth: Payer: Self-pay | Admitting: Nurse Practitioner

## 2017-02-14 ENCOUNTER — Other Ambulatory Visit: Payer: 59

## 2017-02-14 DIAGNOSIS — Z66 Do not resuscitate: Secondary | ICD-10-CM | POA: Diagnosis present

## 2017-02-14 DIAGNOSIS — I10 Essential (primary) hypertension: Secondary | ICD-10-CM | POA: Diagnosis present

## 2017-02-14 DIAGNOSIS — Z9221 Personal history of antineoplastic chemotherapy: Secondary | ICD-10-CM

## 2017-02-14 DIAGNOSIS — E871 Hypo-osmolality and hyponatremia: Secondary | ICD-10-CM | POA: Diagnosis present

## 2017-02-14 DIAGNOSIS — Z515 Encounter for palliative care: Secondary | ICD-10-CM | POA: Diagnosis present

## 2017-02-14 DIAGNOSIS — B181 Chronic viral hepatitis B without delta-agent: Secondary | ICD-10-CM | POA: Diagnosis present

## 2017-02-14 DIAGNOSIS — B1920 Unspecified viral hepatitis C without hepatic coma: Secondary | ICD-10-CM | POA: Diagnosis present

## 2017-02-14 DIAGNOSIS — E119 Type 2 diabetes mellitus without complications: Secondary | ICD-10-CM | POA: Diagnosis present

## 2017-02-14 DIAGNOSIS — Z8249 Family history of ischemic heart disease and other diseases of the circulatory system: Secondary | ICD-10-CM

## 2017-02-14 DIAGNOSIS — E785 Hyperlipidemia, unspecified: Secondary | ICD-10-CM | POA: Diagnosis present

## 2017-02-14 DIAGNOSIS — K219 Gastro-esophageal reflux disease without esophagitis: Secondary | ICD-10-CM | POA: Diagnosis present

## 2017-02-14 DIAGNOSIS — E1310 Other specified diabetes mellitus with ketoacidosis without coma: Secondary | ICD-10-CM

## 2017-02-14 DIAGNOSIS — I2699 Other pulmonary embolism without acute cor pulmonale: Principal | ICD-10-CM | POA: Diagnosis present

## 2017-02-14 DIAGNOSIS — E43 Unspecified severe protein-calorie malnutrition: Secondary | ICD-10-CM | POA: Diagnosis present

## 2017-02-14 DIAGNOSIS — R040 Epistaxis: Secondary | ICD-10-CM | POA: Diagnosis present

## 2017-02-14 DIAGNOSIS — E869 Volume depletion, unspecified: Secondary | ICD-10-CM | POA: Diagnosis present

## 2017-02-14 DIAGNOSIS — Z7901 Long term (current) use of anticoagulants: Secondary | ICD-10-CM

## 2017-02-14 DIAGNOSIS — D509 Iron deficiency anemia, unspecified: Secondary | ICD-10-CM | POA: Diagnosis present

## 2017-02-14 DIAGNOSIS — E7800 Pure hypercholesterolemia, unspecified: Secondary | ICD-10-CM | POA: Diagnosis present

## 2017-02-14 DIAGNOSIS — Z6824 Body mass index (BMI) 24.0-24.9, adult: Secondary | ICD-10-CM

## 2017-02-14 DIAGNOSIS — R531 Weakness: Secondary | ICD-10-CM | POA: Diagnosis not present

## 2017-02-14 DIAGNOSIS — C787 Secondary malignant neoplasm of liver and intrahepatic bile duct: Secondary | ICD-10-CM | POA: Diagnosis present

## 2017-02-14 DIAGNOSIS — Z86711 Personal history of pulmonary embolism: Secondary | ICD-10-CM

## 2017-02-14 LAB — HEPARIN LEVEL (UNFRACTIONATED): Heparin Unfractionated: 0.22 IU/mL — ABNORMAL LOW (ref 0.30–0.70)

## 2017-02-14 MED ORDER — APIXABAN 5 MG PO TABS: 10.0000 mg | ORAL_TABLET | Freq: Two times a day (BID) | ORAL | Status: DC

## 2017-02-14 MED ORDER — FAMOTIDINE 20 MG PO TABS: 20.0000 mg | ORAL_TABLET | Freq: Two times a day (BID) | ORAL | 0 refills | Status: AC

## 2017-02-14 MED ORDER — APIXABAN 5 MG PO TABS: 5.0000 mg | ORAL_TABLET | Freq: Two times a day (BID) | ORAL | Status: DC

## 2017-02-14 MED ORDER — ENSURE ENLIVE PO LIQD: 237.0000 mL | Freq: Three times a day (TID) | ORAL | 12 refills | Status: AC

## 2017-02-14 MED ORDER — HEPARIN SOD (PORK) LOCK FLUSH 100 UNIT/ML IV SOLN
500.0000 [IU] | INTRAVENOUS | Status: AC | PRN
  Administered 2017-02-14: 500 [IU]

## 2017-02-14 MED ORDER — APIXABAN 5 MG PO TABS: 5.0000 mg | ORAL_TABLET | Freq: Two times a day (BID) | ORAL | 0 refills | Status: AC

## 2017-02-14 MED ORDER — APIXABAN 5 MG PO TABS: 10.0000 mg | ORAL_TABLET | Freq: Two times a day (BID) | ORAL | 0 refills | Status: DC

## 2017-02-14 MED ORDER — BOOST / RESOURCE BREEZE PO LIQD: 1.0000 | Freq: Every day | ORAL | 0 refills | Status: AC

## 2017-02-14 NOTE — Discharge Summary (Signed)
Physician Discharge Summary  Justin [name] IHK:[id number] DOB: 03-15-1961 DOA: 02/11/2017  PCP: Delman Cheadle, MD  Admit date: 02/11/2017 Discharge date: 02/14/2017  Admitted From: home  Disposition:  home   Recommendations for Outpatient Follow-up:  1. F/u on poor oral intake and tachycardia  Discharge Condition:  stable   CODE STATUS:  Full code   Diet recommendation:  As tolerated Consultations:  oncology    Discharge Diagnoses:  Principal Problem:   PE (pulmonary thromboembolism) ([place]) Active Problems:   Essential hypertension   Prediabetes   Hepatocellular carcinoma ([place])   Mediastinal adenopathy   Elevated troponin   Hyponatremia   Leukocytosis   Protein-calorie malnutrition, severe    Subjective: Has lower abdominal discomfort when eating solid food- yesterday he told me that his stomach felt tired after eating and that he cough not eat much solid food.   Brief Summary: Justin Ibnaoufis a 56 y.o.malewith HCV, hepatocellular CA with mets who has received first cycle on chemo recently and presents for weakness and dyspnea for about 1 wk. Found to have b/l PE.    Hospital Course:  Principal Problem:   PE - with mild elevated troponin - CT suggests right heart strain but ECHO reveals only grade 1 dCHF without any other abnormalities - no DVT on LE duplex - heparin infusion given for 71 rhs - Dr Ammie Dalton recommended NOAC to be started which has been done in hospital today - has ambulated in hall without hypoxia or dyspnea - is tachycardic with HR in 130s when walking   Active Problems:  Tahcycardia/ hypotension/ mildly elevated troponin - If I and O are accurate, he has not had much oral fluid intake - -  given NS 1 L bolus and a continuous infusion without improvement in heart rate therefore likely a direct result of PE     Essential hypertension - hypotensive in hospital and therefore held both Amlodipine and Cozaar  Hepatocellular carcinoma  /   Mediastinal  adenopathy/ new subcutaneous nodules - management per Dr Ammie Dalton- he tells me that he plans on 1 more chemo and if no improvement, will discuss hospice     Leukocytosis/ low grade fevers - WBC has been 18 since med Fen, possibly due to cancer, no pneumonia, cough, flu like symptoms, or urinary symptoms  Early satiety/ abdominal pain poor solid food intake - try BID Pepcid- states he is not constipated and has had normal BMs   - started Ensure and Boost more frequently and asked him to drink at least 3-4 a day if he is not eating solid food  DM 2 - A1c 7.1 in 12/30 - not on medications at this time     Discharge Instructions  Discharge Instructions    Diet general    Complete by:  As directed    Increase activity slowly    Complete by:  As directed      Allergies as of 02/14/2017      Reactions   Lisinopril Other (See Comments)   coughing      Medication List    STOP taking these medications   amLODipine 10 MG tablet Commonly known as:  NORVASC   losartan 100 MG tablet Commonly known as:  COZAAR     TAKE these medications   apixaban 5 MG Tabs tablet Commonly known as:  ELIQUIS Take 2 tablets (10 mg total) by mouth 2 (two) times daily.   apixaban 5 MG Tabs tablet Commonly known as:  ELIQUIS Take 1 tablet (5 mg  total) by mouth 2 (two) times daily. Start taking on:  02/21/2017   bisacodyl 5 MG EC tablet Commonly known as:  DULCOLAX Take 5 mg by mouth daily as needed for moderate constipation.   calcium carbonate 500 MG chewable tablet Commonly known as:  TUMS - dosed in mg elemental calcium Chew 1 tablet by mouth daily as needed for indigestion or heartburn.   famotidine 20 MG tablet Commonly known as:  PEPCID Take 1 tablet (20 mg total) by mouth 2 (two) times daily.   feeding supplement (ENSURE ENLIVE) Liqd Take 237 mLs by mouth 3 (three) times daily between meals.   feeding supplement Liqd Take 1 Container by mouth daily.   ferrous sulfate 325 (65  FE) MG tablet Take 325 mg by mouth 2 (two) times daily with a meal.   lidocaine-prilocaine cream Commonly known as:  EMLA Apply to port site one hour prior to use. Do not rub in, cover with plastic.   naproxen sodium 220 MG tablet Commonly known as:  ANAPROX Take 220 mg by mouth 2 (two) times daily as needed (for pain/fever/headache.).   ondansetron 8 MG tablet Commonly known as:  ZOFRAN Take 1 tablet (8 mg total) by mouth every 8 (eight) hours as needed for nausea or vomiting.   oxyCODONE-acetaminophen 5-325 MG tablet Commonly known as:  PERCOCET/ROXICET Take 1-2 tablets by mouth every 4 (four) hours as needed for severe pain.   potassium chloride SA 20 MEQ tablet Commonly known as:  K-DUR,KLOR-CON Take 1 tablet (20 mEq total) by mouth daily.   prochlorperazine 10 MG tablet Commonly known as:  COMPAZINE Take 1 tablet (10 mg total) by mouth every 6 (six) hours as needed for nausea or vomiting.       Allergies  Allergen Reactions  . Lisinopril Other (See Comments)    coughing     Procedures/Studies:   Ct Angio Chest Pe W/cm &/or Wo Cm  Result Date: 02/12/2017 CLINICAL DATA:  Initial evaluation for acute dyspnea on exertion, tachycardia. History of hepatocellular carcinoma. EXAM: CT ANGIOGRAPHY CHEST WITH CONTRAST TECHNIQUE: Multidetector CT imaging of the chest was performed using the standard protocol during bolus administration of intravenous contrast. Multiplanar CT image reconstructions and MIPs were obtained to evaluate the vascular anatomy. CONTRAST:  100 cc of Isovue 370. COMPARISON:  Prior CT from 12/17/2016. FINDINGS: Cardiovascular: Intrathoracic aorta of normal caliber without acute abnormality. Visualized great vessels within normal limits. Right-sided Port-A-Cath in place. Heart size within normal limits. No pericardial effusion. Pulmonary arterial tree adequately opacified for evaluation. Main pulmonary artery within normal limits for size measuring 2.9 cm in  diameter. There is scattered nonocclusive thrombus involving segmental pulmonary artery supplying the right lower lobe (series 6, image 133). Additional scattered nonocclusive thrombus within segmental lingular branches (series 6, image 142). Possible additional minimal clot within segmental left upper lobe branches (series 10, image 61) no other significant clot identified. RV to LV ratio mildly elevated at 1.0. Mild straightening of the intraventricular septum. Mediastinum/Nodes: Few scattered subcentimeter hypodense nodules noted within the thyroid, of doubtful significance. Bulky right paratracheal adenopathy measuring 3.5 x 3.0 cm (series 6, image 90). Additional bulky precarinal nodes measure approximately 2.2 x 4.9 cm. Large subcarinal nodal conglomerate measures 4.4 x 7.5 x 12.0 cm. This extends from the subcarinal region inferiorly towards the GE junction. Secondary mild mass effect on the left atrium which is flattened posteriorly. This extends inferiorly towards the GE junction along the anterior margin of the aorta. Scattered periaortic nodes present  posteriorly measuring up to 1 cm (series 6, image 207). Bulky right hilar node measures 2.3 x 2.4 cm (series 6, image 135). Lungs/Pleura: Tracheobronchial tree is patent. Right hemidiaphragm is elevated with associated right basilar atelectasis. Irregular lobular bulging of the right hemidiaphragm from the subjacent liver (series 11, image 65). 4 mm right lower lobe nodule (series 7, image 43). Additional 3 mm right upper lobe nodule (series 7, image 44). 6 mm subpleural left upper lobe nodule (series 7, image 28). Additional 4 mm subpleural left upper lobe nodule (series 7, image 24). Few additional scattered right middle lobe nodules measure up to 8 mm (series 5, image 55). No focal infiltrates.  No pleural effusion.  No pneumothorax. Upper Abdomen: Large heterogeneous mass involving the right hepatic lobe again seen, compatible with history of  hepatocellular carcinoma. Although direct measurements are somewhat difficult due to the infiltrative nature of this lesion, this appears increased in size from previous. Remainder the visualized upper abdomen otherwise unremarkable. Musculoskeletal: No acute osseous abnormality. No worrisome lytic or blastic osseous lesions. Review of the MIP images confirms the above findings. IMPRESSION: 1. Scattered acute pulmonary emboli involving bilateral segmental pulmonary arteries as above, with greatest involvement within the right lower lobe. Positive for acute PE with CT evidence of right heart strain (RV/LV Ratio = 1.0) consistent with at least submassive (intermediate risk) PE. The presence of right heart strain has been associated with an increased risk of morbidity and mortality. Please activate Code PE by paging [phone]. 2. Large infiltrative heterogeneous right hepatic lobe mass, compatible with history of hepatocellular carcinoma, suspected to be increased in size from previous. 3. Extensive bulky mediastinal and right hilar adenopathy as above, compatible with nodal metastases. 4. Scattered bilateral pulmonary nodules as above, several of which are new from previous, and may also reflect metastatic disease. Attention at follow-up recommended. Critical Value/emergent results were called by telephone at the time of interpretation on 02/12/2017 at 3:04 am to Dr. Rolland Porter , who verbally acknowledged these results. Electronically Signed   By: Jeannine Boga M.D.   On: 02/12/2017 03:08   Mr Lumbar Spine Wo Contrast  Result Date: 02/04/2017 CLINICAL DATA:  Hepatocellular carcinoma.  Radicular low back pain. EXAM: MRI LUMBAR SPINE WITHOUT CONTRAST TECHNIQUE: Multiplanar, multisequence MR imaging of the lumbar spine was performed. No intravenous contrast was administered. COMPARISON:  CT the abdomen and pelvis 01/23/2017 FINDINGS: Segmentation: 5 non rib-bearing lumbar type vertebral bodies are present.  Alignment:  AP alignment is anatomic. Vertebrae: Marrow signal is diffusely compressed, compatible with chronic anemia Conus medullaris: Extends to the L1 level and appears normal. Paraspinal and other soft tissues: Limited imaging of the abdomen demonstrates a benign cyst in the left kidney measuring 15 mm. The treated right hepatic mass is not well seen. Disc levels: L1-2:  Negative. L2-3: A broad-based disc protrusion is present. There is a central annular tear. Mild subarticular narrowing is worse on the left. There is no significant foraminal disease. L3-4:  Negative. L4-5: A broad-based disc protrusion is asymmetric to the right. Mild subarticular narrowing is present on the right. Moderate right and mild left foraminal stenosis is present. L5-S1: A leftward disc protrusion is present. Asymmetric left-sided facet hypertrophy is noted. This results in moderate left subarticular and foraminal stenosis. The right foramen is patent. IMPRESSION: 1. Broad-based disc protrusion and central annular tear at L2-3 with mild subarticular narrowing, worse on the left. 2. Far right lateral disc protrusion at L4-5 with mild right-sided subarticular narrowing, moderate  right, and mild left foraminal stenosis. 3. Leftward disc protrusion at L5-S1 with moderate left subarticular and foraminal narrowing. Electronically Signed   By: San Morelle M.D.   On: 02/04/2017 13:50   Ct Abdomen Pelvis W Contrast  Result Date: 01/23/2017 CLINICAL DATA:  Large right hepatic lobe hepatocellular carcinoma, with planned radio embolization. Cough. Abdominal pain and elevated bilirubin EXAM: CT ABDOMEN AND PELVIS WITH CONTRAST TECHNIQUE: Multidetector CT imaging of the abdomen and pelvis was performed using the standard protocol following bolus administration of intravenous contrast. CONTRAST:  179mL ISOVUE-300 IOPAMIDOL (ISOVUE-300) INJECTION 61% COMPARISON:  Multiple exams, including 12/12/2016 MRI and CT of 12/17/2016 FINDINGS:  Lower chest: Abnormal right infrahilar low-density lymph node 1.4 cm in short axis on image 6/2. Extensive subcarinal and paraesophageal low-density mass significantly increased over the past 5 weeks, measuring up to 3.3 by 7.0 cm on image 6/2. A distal paraesophageal node measures 2.8 cm in short axis on image 16/2. Possible invasion of the large right hepatocellular carcinoma through the right hemidiaphragm on images 9/2. Hepatobiliary: The mass arising from the right hepatic lobe measures 17.0 by 13.8 by 12.6 cm (volume = 1550 cm^3), a significant enlargement compared to prior. This appears to extend across into the left hepatic lobe adjacent to the IVC and displaces and flattens the intrahepatic IVC significantly. Pancreas: There is a new 8 mm focus of hypoenhancement in the pancreatic head near the junction with the uncinate process on image 42/2, not readily visible on the prior exam. Early metastatic lesion not excluded. Spleen: Unremarkable Adrenals/Urinary Tract: New metastatic lesions to the left adrenal gland, largest 1.8 by 2.9 cm. New metastatic lesions to the right adrenal gland, including a 2.2 by 1.8 cm posterior nodule and a 2.5 by 1.2 cm lateral limb nodule. There is a new a hypodense nodular lesion in the right mid kidney measuring 9 mm in diameter on image 42/2, although this resembles a cyst the fact that is new compared to an exam from 5 weeks ago raises concern for possible metastatic lesion. There is also a new small right perirenal nodule measuring about 10 mm in diameter on image 44/2. New anterior right perirenal nodule 1.0 by 0.8 cm on image 36/2. Stomach/Bowel: Unremarkable Vascular/Lymphatic: Coils near the origin of the gastroduodenal artery, no visualized patency of the GDA observed. Porta hepatis and peripancreatic adenopathy an index node adjacent to the upper IVC measures 1.7 cm in short axis on image 38/2. Reproductive: Unremarkable Other: New omental nodule 1.2 by 0.8 cm, image  57/2. New anterior abdominal wall subcutaneous nodule, 1.6 by 1.2 cm on image 70/2. New left anterior omental nodule 0.6 cm in diameter on image 51/2. New left anterior abdominal wall subcutaneous nodule, 1.7 by 1.1 cm on image 85/2. New anterior subcutaneous nodule in the lower pelvis eccentric to the right, 1.2 by 0.9 cm on image 91/2. Musculoskeletal: Sacralized L5. Mild left foraminal stenosis at L4-5 due to spurring. IMPRESSION: 1. Rapidly progressive metastatic disease and enlarging right hepatic lobe mass. The mass now measures 1550 cubic cm with new metastatic lesions to the right infrahilar region, subcarinal and para sat esophageal regions, right perirenal space, anterior subcutaneous tissues, omentum, porta hepatis, pancreatic head, and right kidney. The tumor may be invading through the right hemidiaphragm. 2. Coils near the origin of the gastroduodenal artery without patency of the GDA observed. Electronically Signed   By: Van Clines M.D.   On: 01/23/2017 11:12   Ir US Guide Vasc Access Right  Result Date:  01/30/2017 INDICATION: 56 year old with hepatocellular carcinoma. Port-A-Cath needed for chemotherapy. EXAM: FLUOROSCOPIC AND ULTRASOUND GUIDED PLACEMENT OF A SUBCUTANEOUS PORT COMPARISON:  None. MEDICATIONS: Ancef 2 g; The antibiotic was administered within an appropriate time interval prior to skin puncture. ANESTHESIA/SEDATION: Versed 4.0 mg IV; Fentanyl 125 mcg IV; Moderate Sedation Time:  32 minutes The patient was continuously monitored during the procedure by the interventional radiology nurse under my direct supervision. FLUOROSCOPY TIME:  36 seconds, 5 mGy COMPLICATIONS: None immediate. PROCEDURE: The procedure, risks, benefits, and alternatives were explained to the patient. Questions regarding the procedure were encouraged and answered. The patient understands and consents to the procedure. Patient was placed supine on the interventional table. Ultrasound confirmed a patent  right internal jugular vein. The right chest and neck were cleaned with a skin antiseptic and a sterile drape was placed. Maximal barrier sterile technique was utilized including caps, mask, sterile gowns, sterile gloves, sterile drape, hand hygiene and skin antiseptic. The right neck was anesthetized with 1% lidocaine. Small incision was made in the right neck with a blade. Micropuncture set was placed in the right internal jugular vein with ultrasound guidance. The micropuncture wire was used for measurement purposes. The right chest was anesthetized with 1% lidocaine with epinephrine. #15 blade was used to make an incision and a subcutaneous port pocket was formed. [place] was assembled. Subcutaneous tunnel was formed with a stiff tunneling device. The port catheter was brought through the subcutaneous tunnel. The port was placed in the subcutaneous pocket. The micropuncture set was exchanged for a peel-away sheath. The catheter was placed through the peel-away sheath and the tip was positioned at the superior cavoatrial junction. Catheter placement was confirmed with fluoroscopy. The port was accessed and flushed with heparinized saline. The port pocket was closed using two layers of absorbable sutures and Dermabond. The vein skin site was closed using a single layer of absorbable suture and Dermabond. Sterile dressings were applied. Patient tolerated the procedure well without an immediate complication. Ultrasound and fluoroscopic images were taken and saved for this procedure. IMPRESSION: Placement of a subcutaneous port device. Electronically Signed   By: Markus Daft M.D.   On: 01/30/2017 15:52   Dg Chest Port 1 View  Result Date: 02/12/2017 CLINICAL DATA:  Fever, nausea and weakness tonight. Patient on chemotherapy for liver cancer. EXAM: PORTABLE CHEST 1 VIEW COMPARISON:  CXR from 02/27/2004 and addendum CT abdomen from 12/17/2016 FINDINGS: Elevated appearance of the right hemidiaphragm  unchanged from prior CT of the abdomen likely secondary to an enlarged right hepatic lobe mass seen on CT beneath the right hemidiaphragm. This is not believed to due to a subpulmonic effusion. Left lung is clear. There is mild interstitial edema. Port catheter tip is seen in the distal SVC. No pulmonary consolidation. No pneumothorax. The nodular densities seen prior CT within the right middle lobe upper not radiographically apparent. However there may be a 6 mm nodular density in the left upper lobe. No suspicious osseous lesions. IMPRESSION: Mild interstitial edema. Possible 6 mm nodular density in the left upper lobe versus a summation vascular shadows. Elevated right hemidiaphragm likely from known underlying hepatic mass. Electronically Signed   By: Ashley Royalty M.D.   On: 02/12/2017 01:12   Ir Fluoro Guide Port Insertion Right  Result Date: 01/30/2017 INDICATION: 56 year old with hepatocellular carcinoma. Port-A-Cath needed for chemotherapy. EXAM: FLUOROSCOPIC AND ULTRASOUND GUIDED PLACEMENT OF A SUBCUTANEOUS PORT COMPARISON:  None. MEDICATIONS: Ancef 2 g; The antibiotic was administered within an  appropriate time interval prior to skin puncture. ANESTHESIA/SEDATION: Versed 4.0 mg IV; Fentanyl 125 mcg IV; Moderate Sedation Time:  32 minutes The patient was continuously monitored during the procedure by the interventional radiology nurse under my direct supervision. FLUOROSCOPY TIME:  36 seconds, 5 mGy COMPLICATIONS: None immediate. PROCEDURE: The procedure, risks, benefits, and alternatives were explained to the patient. Questions regarding the procedure were encouraged and answered. The patient understands and consents to the procedure. Patient was placed supine on the interventional table. Ultrasound confirmed a patent right internal jugular vein. The right chest and neck were cleaned with a skin antiseptic and a sterile drape was placed. Maximal barrier sterile technique was utilized including caps,  mask, sterile gowns, sterile gloves, sterile drape, hand hygiene and skin antiseptic. The right neck was anesthetized with 1% lidocaine. Small incision was made in the right neck with a blade. Micropuncture set was placed in the right internal jugular vein with ultrasound guidance. The micropuncture wire was used for measurement purposes. The right chest was anesthetized with 1% lidocaine with epinephrine. #15 blade was used to make an incision and a subcutaneous port pocket was formed. [place] was assembled. Subcutaneous tunnel was formed with a stiff tunneling device. The port catheter was brought through the subcutaneous tunnel. The port was placed in the subcutaneous pocket. The micropuncture set was exchanged for a peel-away sheath. The catheter was placed through the peel-away sheath and the tip was positioned at the superior cavoatrial junction. Catheter placement was confirmed with fluoroscopy. The port was accessed and flushed with heparinized saline. The port pocket was closed using two layers of absorbable sutures and Dermabond. The vein skin site was closed using a single layer of absorbable suture and Dermabond. Sterile dressings were applied. Patient tolerated the procedure well without an immediate complication. Ultrasound and fluoroscopic images were taken and saved for this procedure. IMPRESSION: Placement of a subcutaneous port device. Electronically Signed   By: Markus Daft M.D.   On: 01/30/2017 15:52       Discharge Exam: Vitals:   02/13/17 2022 02/14/17 0518  BP: 123/84 107/74  Pulse: (!) 131 (!) 126  Resp: (!) 22 (!) 0  Temp: 99.3 F (37.4 C) 98.3 F (36.8 C)   Vitals:   02/13/17 1700 02/13/17 1743 02/13/17 2022 02/14/17 0518  BP:  133/89 123/84 107/74  Pulse: (!) 131 (!) 135 (!) 131 (!) 126  Resp: (!) 26 (!) 28 (!) 22 (!) 0  Temp:  99.5 F (37.5 C) 99.3 F (37.4 C) 98.3 F (36.8 C)  TempSrc:  Oral Oral Oral  SpO2: 95% 96% 97% 95%  Weight:      Height:         General: Pt is alert, awake, not in acute distress Cardiovascular: RRR, S1/S2 +, no rubs, no gallops Respiratory: CTA bilaterally, no wheezing, no rhonchi Abdominal: Soft, NT, ND, bowel sounds + Extremities: no edema, no cyanosis    The results of significant diagnostics from this hospitalization (including imaging, microbiology, ancillary and laboratory) are listed below for reference.     Microbiology: Recent Results (from the past 240 hour(s))  MRSA PCR Screening     Status: None   Collection Time: 02/12/17  4:51 AM  Result Value Ref Range Status   MRSA by PCR NEGATIVE NEGATIVE Final    Comment:        The GeneXpert MRSA Assay (FDA approved for NASAL specimens only), is one component of a comprehensive MRSA colonization surveillance program. It  is not intended to diagnose MRSA infection nor to guide or monitor treatment for MRSA infections.      Labs: BNP (last 3 results)  Recent Labs  02/12/17 0031  BNP 826.4*   Basic Metabolic Panel:  Recent Labs Lab 02/12/17 0031 02/12/17 0959 02/13/17 0450  NA 127* 136 137  K 3.2* 3.4* 3.6  CL 93* 103 104  CO2 25 27 25   GLUCOSE 174* 128* 104*  BUN 12 9 11   CREATININE 0.67 0.71 0.69  CALCIUM 9.3 9.0 9.1  MG  --  1.9  --    Liver Function Tests:  Recent Labs Lab 02/12/17 0031  AST 34  ALT 27  ALKPHOS 133*  BILITOT 0.6  PROT 6.7  ALBUMIN 2.6*   No results for input(s): LIPASE, AMYLASE in the last 168 hours. No results for input(s): AMMONIA in the last 168 hours. CBC:  Recent Labs Lab 02/12/17 0031 02/12/17 0959 02/13/17 0450  WBC 15.3* 16.0* 18.4*  NEUTROABS 11.4*  --   --   HGB 10.5* 10.6* 10.5*  HCT 32.0* 32.0* 32.7*  MCV 76.7* 77.1* 77.9*  PLT 244 262 293   Cardiac Enzymes:  Recent Labs Lab 02/12/17 0031 02/12/17 0959 02/12/17 1318 02/12/17 2300  TROPONINI 0.05* 0.06* 0.05* 0.08*   BNP: Invalid input(s): POCBNP CBG:  Recent Labs Lab 02/12/17 0538 02/12/17 0758  02/13/17 0451 02/13/17 0836  GLUCAP 127* 110* 97 163*   D-Dimer No results for input(s): DDIMER in the last 72 hours. Hgb A1c No results for input(s): HGBA1C in the last 72 hours. Lipid Profile No results for input(s): CHOL, HDL, LDLCALC, TRIG, CHOLHDL, LDLDIRECT in the last 72 hours. Thyroid function studies  Recent Labs  02/12/17 0959  TSH 3.011   Anemia work up No results for input(s): VITAMINB12, FOLATE, FERRITIN, TIBC, IRON, RETICCTPCT in the last 72 hours. Urinalysis    Component Value Date/Time   COLORURINE dk. yellow 12/21/2010 0921   LABSPEC <1.005 01/18/2011 1006   PHURINE 6.5 01/18/2011 1006   HGBUR negative 01/18/2011 1006   BILIRUBINUR small (A) 12/07/2016 0855   KETONESUR negative 12/07/2016 0855   PROTEINUR =30 (A) 12/07/2016 0855   UROBILINOGEN 1.0 12/07/2016 0855   UROBILINOGEN 0.2 01/18/2011 1006   NITRITE Negative 12/07/2016 0855   NITRITE negative 01/18/2011 1006   LEUKOCYTESUR Negative 12/07/2016 0855   Sepsis Labs Invalid input(s): PROCALCITONIN,  WBC,  LACTICIDVEN Microbiology Recent Results (from the past 240 hour(s))  MRSA PCR Screening     Status: None   Collection Time: 02/12/17  4:51 AM  Result Value Ref Range Status   MRSA by PCR NEGATIVE NEGATIVE Final    Comment:        The GeneXpert MRSA Assay (FDA approved for NASAL specimens only), is one component of a comprehensive MRSA colonization surveillance program. It is not intended to diagnose MRSA infection nor to guide or monitor treatment for MRSA infections.      Time coordinating discharge: Over 30 minutes  SIGNED:   Debbe Odea, MD  [facility] 02/14/2017, 10:12 AM Pager   If 7PM-7AM, please contact night-coverage [email or web address] Password [email or web address]

## 2017-02-14 NOTE — Telephone Encounter (Signed)
3/2 Patient called to have 3/6 appointments canceled and chemo added on 3/9. Saw this was per 2/27 LOS.  3/2 Requested patient be added on 3/9 for chemo. Patient could not be added on 3/9 per infusion room capped.   3/6 patient admitted into hospital for pulmonary embolism   3/7 Waited to hear back from LT of what to do for the patient.   3/9 Scheduling message sent to add patient for lab, follow up, and chemo for 3/15.- 3/15 capped. Inform nurse / MD  3/9 Patient scheduled for 3/13 - lab, follow up, and chemo. Called patient to inform him of next scheduled appointments. Left Voicemail.

## 2017-02-14 NOTE — Discharge Instructions (Addendum)
Your blood pressure is low and at this time you do not need BP meds.  Your heart rate is fast likely from the clots in your lungs. Do not exert yourself.  Watch for bleeding as you are on a strong blood thinner.  Gas X can be bought over the counter for abdominal gas pain.   Please take all your medications with you for your next visit with your Primary MD. Please request your Primary MD to go over all hospital test results at the follow up. Please ask your Primary MD to get all Hospital records sent to his/her office.  If you experience worsening of your admission symptoms, develop shortness of breath, chest pain, suicidal or homicidal thoughts or a life threatening emergency, you must seek medical attention immediately by calling 911 or calling your MD.  Dennis Bast must read the complete instructions/literature along with all the possible adverse reactions/side effects for all the medicines you take including new medications that have been prescribed to you. Take new medicines after you have completely understood and accpet all the possible adverse reactions/side effects.   Do not drive when taking pain medications or sedatives.    Do not take more than prescribed Pain, Sleep and Anxiety Medications  If you have smoked or chewed Tobacco in the last 2 yrs please stop. Stop any regular alcohol and or recreational drug use.  Wear Seat belts while driving.  ___________________________________________________  Information on my medicine - ELIQUIS (apixaban)  This medication education was reviewed with me or my healthcare representative as part of my discharge preparation.    Why was Eliquis prescribed for you? Eliquis was prescribed to treat blood clots that may have been found in the veins of your legs (deep vein thrombosis) or in your lungs (pulmonary embolism) and to reduce the risk of them occurring again.  What do You need to know about Eliquis ? The starting dose is 10 mg (two 5  mg tablets) taken TWICE daily for the FIRST SEVEN (7) DAYS, then on 02/21/17  the dose is reduced to ONE 5 mg tablet taken TWICE daily.  Eliquis may be taken with or without food.   Try to take the dose about the same time in the morning and in the evening. If you have difficulty swallowing the tablet whole please discuss with your pharmacist how to take the medication safely.  Take Eliquis exactly as prescribed and DO NOT stop taking Eliquis without talking to the doctor who prescribed the medication.  Stopping may increase your risk of developing a new blood clot.  Refill your prescription before you run out.  After discharge, you should have regular check-up appointments with your healthcare provider that is prescribing your Eliquis.    What do you do if you miss a dose? If a dose of ELIQUIS is not taken at the scheduled time, take it as soon as possible on the same day and twice-daily administration should be resumed. The dose should not be doubled to make up for a missed dose.  Important Safety Information A possible side effect of Eliquis is bleeding. You should call your healthcare provider right away if you experience any of the following: ? Bleeding from an injury or your nose that does not stop. ? Unusual colored urine (red or dark brown) or unusual colored stools (red or black). ? Unusual bruising for unknown reasons. ? A serious fall or if you hit your head (even if there is no bleeding).  Some medicines may interact  with Eliquis and might increase your risk of bleeding or clotting while on Eliquis. To help avoid this, consult your healthcare provider or pharmacist prior to using any new prescription or non-prescription medications, including herbals, vitamins, non-steroidal anti-inflammatory drugs (NSAIDs) and supplements.  This website has more information on Eliquis (apixaban): http://www.eliquis.com/eliquis/home

## 2017-02-14 NOTE — Progress Notes (Signed)
Patient given discharge instructions, and verbalized an understanding of all discharge instructions.  Patient agrees with discharge plan, and is being discharged in stable medical condition.  Patient given transportation via wheelchair. 

## 2017-02-15 ENCOUNTER — Inpatient Hospital Stay (HOSPITAL_COMMUNITY)
Admission: EM | Admit: 2017-02-15 | Discharge: 2017-02-20 | DRG: 163 | Disposition: A | Discharge status: Hospice | Payer: 59 | Attending: Nephrology | Admitting: Nephrology

## 2017-02-15 ENCOUNTER — Other Ambulatory Visit: Payer: Self-pay

## 2017-02-15 ENCOUNTER — Encounter (HOSPITAL_COMMUNITY): Payer: Self-pay | Admitting: Nurse Practitioner

## 2017-02-15 ENCOUNTER — Inpatient Hospital Stay (HOSPITAL_COMMUNITY): Payer: 59

## 2017-02-15 DIAGNOSIS — R040 Epistaxis: Secondary | ICD-10-CM | POA: Diagnosis present

## 2017-02-15 DIAGNOSIS — Z8249 Family history of ischemic heart disease and other diseases of the circulatory system: Secondary | ICD-10-CM | POA: Diagnosis not present

## 2017-02-15 DIAGNOSIS — Z9221 Personal history of antineoplastic chemotherapy: Secondary | ICD-10-CM | POA: Diagnosis not present

## 2017-02-15 DIAGNOSIS — D72829 Elevated white blood cell count, unspecified: Secondary | ICD-10-CM | POA: Diagnosis not present

## 2017-02-15 DIAGNOSIS — C220 Liver cell carcinoma: Secondary | ICD-10-CM | POA: Diagnosis not present

## 2017-02-15 DIAGNOSIS — E871 Hypo-osmolality and hyponatremia: Secondary | ICD-10-CM | POA: Diagnosis present

## 2017-02-15 DIAGNOSIS — R531 Weakness: Secondary | ICD-10-CM

## 2017-02-15 DIAGNOSIS — B1920 Unspecified viral hepatitis C without hepatic coma: Secondary | ICD-10-CM | POA: Diagnosis present

## 2017-02-15 DIAGNOSIS — Z7189 Other specified counseling: Secondary | ICD-10-CM | POA: Diagnosis not present

## 2017-02-15 DIAGNOSIS — I10 Essential (primary) hypertension: Secondary | ICD-10-CM | POA: Diagnosis present

## 2017-02-15 DIAGNOSIS — I2699 Other pulmonary embolism without acute cor pulmonale: Secondary | ICD-10-CM | POA: Diagnosis present

## 2017-02-15 DIAGNOSIS — Z515 Encounter for palliative care: Secondary | ICD-10-CM | POA: Diagnosis present

## 2017-02-15 DIAGNOSIS — R0489 Hemorrhage from other sites in respiratory passages: Secondary | ICD-10-CM | POA: Diagnosis not present

## 2017-02-15 DIAGNOSIS — J9690 Respiratory failure, unspecified, unspecified whether with hypoxia or hypercapnia: Secondary | ICD-10-CM | POA: Diagnosis not present

## 2017-02-15 DIAGNOSIS — R0602 Shortness of breath: Secondary | ICD-10-CM | POA: Diagnosis not present

## 2017-02-15 DIAGNOSIS — Z6824 Body mass index (BMI) 24.0-24.9, adult: Secondary | ICD-10-CM | POA: Diagnosis not present

## 2017-02-15 DIAGNOSIS — R0600 Dyspnea, unspecified: Secondary | ICD-10-CM | POA: Diagnosis not present

## 2017-02-15 DIAGNOSIS — E785 Hyperlipidemia, unspecified: Secondary | ICD-10-CM | POA: Diagnosis present

## 2017-02-15 DIAGNOSIS — K219 Gastro-esophageal reflux disease without esophagitis: Secondary | ICD-10-CM | POA: Diagnosis present

## 2017-02-15 DIAGNOSIS — E7800 Pure hypercholesterolemia, unspecified: Secondary | ICD-10-CM | POA: Diagnosis present

## 2017-02-15 DIAGNOSIS — C787 Secondary malignant neoplasm of liver and intrahepatic bile duct: Secondary | ICD-10-CM | POA: Diagnosis present

## 2017-02-15 DIAGNOSIS — E43 Unspecified severe protein-calorie malnutrition: Secondary | ICD-10-CM | POA: Diagnosis present

## 2017-02-15 DIAGNOSIS — R000 Tachycardia, unspecified: Secondary | ICD-10-CM | POA: Diagnosis not present

## 2017-02-15 DIAGNOSIS — E119 Type 2 diabetes mellitus without complications: Secondary | ICD-10-CM | POA: Diagnosis present

## 2017-02-15 DIAGNOSIS — E869 Volume depletion, unspecified: Secondary | ICD-10-CM | POA: Diagnosis present

## 2017-02-15 DIAGNOSIS — D509 Iron deficiency anemia, unspecified: Secondary | ICD-10-CM | POA: Diagnosis present

## 2017-02-15 DIAGNOSIS — R509 Fever, unspecified: Secondary | ICD-10-CM | POA: Diagnosis not present

## 2017-02-15 DIAGNOSIS — Z7901 Long term (current) use of anticoagulants: Secondary | ICD-10-CM | POA: Diagnosis not present

## 2017-02-15 DIAGNOSIS — A419 Sepsis, unspecified organism: Secondary | ICD-10-CM | POA: Diagnosis not present

## 2017-02-15 DIAGNOSIS — Z86711 Personal history of pulmonary embolism: Secondary | ICD-10-CM | POA: Diagnosis not present

## 2017-02-15 DIAGNOSIS — Z66 Do not resuscitate: Secondary | ICD-10-CM | POA: Diagnosis present

## 2017-02-15 DIAGNOSIS — B181 Chronic viral hepatitis B without delta-agent: Secondary | ICD-10-CM | POA: Diagnosis present

## 2017-02-15 LAB — COMPREHENSIVE METABOLIC PANEL
ALT: 28 U/L (ref 17–63)
AST: 48 U/L — ABNORMAL HIGH (ref 15–41)
Albumin: 2.5 g/dL — ABNORMAL LOW (ref 3.5–5.0)
Alkaline Phosphatase: 139 U/L — ABNORMAL HIGH (ref 38–126)
Anion gap: 11 (ref 5–15)
BUN: 12 mg/dL (ref 6–20)
CO2: 21 mmol/L — ABNORMAL LOW (ref 22–32)
Calcium: 8.8 mg/dL — ABNORMAL LOW (ref 8.9–10.3)
Chloride: 94 mmol/L — ABNORMAL LOW (ref 101–111)
Creatinine, Ser: 0.72 mg/dL (ref 0.61–1.24)
GFR calc Af Amer: >60 mL/min (ref >60)
GFR calc non Af Amer: >60 mL/min (ref >60)
Glucose, Bld: 133 mg/dL — ABNORMAL HIGH (ref 65–99)
Potassium: 3.6 mmol/L (ref 3.5–5.1)
Sodium: 126 mmol/L — ABNORMAL LOW (ref 135–145)
Total Bilirubin: 1 mg/dL (ref 0.3–1.2)
Total Protein: 6.7 g/dL (ref 6.5–8.1)

## 2017-02-15 LAB — TYPE AND SCREEN
ABO/RH(D): B POS
Antibody Screen: NEGATIVE

## 2017-02-15 LAB — CBC WITH DIFFERENTIAL/PLATELET
BASOS PCT: 0 %
Basophils Absolute: 0 10*3/uL (ref 0.0–0.1)
Basophils Absolute: 0 10*3/uL (ref 0.0–0.1)
Basophils Relative: 0 %
Eosinophils Absolute: 0 10*3/uL (ref 0.0–0.7)
Eosinophils Absolute: 0 10*3/uL (ref 0.0–0.7)
Eosinophils Relative: 0 %
Eosinophils Relative: 0 %
HCT: 29.9 % — ABNORMAL LOW (ref 39.0–52.0)
HEMATOCRIT: 27.8 % — AB (ref 39.0–52.0)
HEMOGLOBIN: 9 g/dL — AB (ref 13.0–17.0)
Hemoglobin: 10 g/dL — ABNORMAL LOW (ref 13.0–17.0)
LYMPHS PCT: 6 %
Lymphocytes Relative: 7 %
Lymphs Abs: 2 10*3/uL (ref 0.7–4.0)
Lymphs Abs: 1.6 10*3/uL (ref 0.7–4.0)
MCH: 25.6 pg — ABNORMAL LOW (ref 26.0–34.0)
MCH: 25.1 pg — AB (ref 26.0–34.0)
MCHC: 33.4 g/dL (ref 30.0–36.0)
MCHC: 32.4 g/dL (ref 30.0–36.0)
MCV: 76.5 fL — ABNORMAL LOW (ref 78.0–100.0)
MCV: 77.7 fL — AB (ref 78.0–100.0)
MONOS PCT: 11 %
Monocytes Absolute: 3.8 10*3/uL — ABNORMAL HIGH (ref 0.1–1.0)
Monocytes Absolute: 2.9 10*3/uL — ABNORMAL HIGH (ref 0.1–1.0)
Monocytes Relative: 13 %
NEUTROS ABS: 22 10*3/uL — AB (ref 1.7–7.7)
Neutro Abs: 23.2 10*3/uL — ABNORMAL HIGH (ref 1.7–7.7)
Neutrophils Relative %: 80 %
Neutrophils Relative %: 83 %
Platelets: 332 10*3/uL (ref 150–400)
Platelets: 296 10*3/uL (ref 150–400)
RBC: 3.91 MIL/uL — ABNORMAL LOW (ref 4.22–5.81)
RBC: 3.58 MIL/uL — ABNORMAL LOW (ref 4.22–5.81)
RDW: 16.2 % — ABNORMAL HIGH (ref 11.5–15.5)
RDW: 16.7 % — ABNORMAL HIGH (ref 11.5–15.5)
WBC: 29 10*3/uL — ABNORMAL HIGH (ref 4.0–10.5)
WBC: 26.5 10*3/uL — ABNORMAL HIGH (ref 4.0–10.5)

## 2017-02-15 LAB — URINALYSIS, ROUTINE W REFLEX MICROSCOPIC
Bilirubin Urine: NEGATIVE
Glucose, UA: NEGATIVE mg/dL
Hgb urine dipstick: NEGATIVE
Ketones, ur: 20 mg/dL — AB
Leukocytes, UA: NEGATIVE
Nitrite: NEGATIVE
Protein, ur: NEGATIVE mg/dL
Specific Gravity, Urine: 1.012 (ref 1.005–1.030)
pH: 6 (ref 5.0–8.0)

## 2017-02-15 LAB — LIPASE, BLOOD: Lipase: 95 U/L — ABNORMAL HIGH (ref 11–51)

## 2017-02-15 LAB — SODIUM, URINE, RANDOM: SODIUM UR: 19 mmol/L

## 2017-02-15 LAB — PROTIME-INR
INR: 1.86
Prothrombin Time: 21.7 seconds — ABNORMAL HIGH (ref 11.4–15.2)

## 2017-02-15 LAB — PROCALCITONIN: Procalcitonin: 0.83 ng/mL

## 2017-02-15 LAB — BASIC METABOLIC PANEL
Anion gap: 9 (ref 5–15)
BUN: 10 mg/dL (ref 6–20)
CALCIUM: 8.2 mg/dL — AB (ref 8.9–10.3)
CO2: 20 mmol/L — ABNORMAL LOW (ref 22–32)
CREATININE: 0.67 mg/dL (ref 0.61–1.24)
Chloride: 100 mmol/L — ABNORMAL LOW (ref 101–111)
GFR calc Af Amer: >60 mL/min (ref >60)
GLUCOSE: 112 mg/dL — AB (ref 65–99)
Potassium: 3.8 mmol/L (ref 3.5–5.1)
Sodium: 129 mmol/L — ABNORMAL LOW (ref 135–145)

## 2017-02-15 LAB — I-STAT CG4 LACTIC ACID, ED: Lactic Acid, Venous: 2.5 mmol/L (ref 0.5–1.9)

## 2017-02-15 LAB — LACTIC ACID, PLASMA
LACTIC ACID, VENOUS: 2.2 mmol/L — AB (ref 0.5–1.9)
Lactic Acid, Venous: 2.8 mmol/L (ref 0.5–1.9)

## 2017-02-15 LAB — GLUCOSE, CAPILLARY
GLUCOSE-CAPILLARY: 142 mg/dL — AB (ref 65–99)
Glucose-Capillary: 118 mg/dL — ABNORMAL HIGH (ref 65–99)
Glucose-Capillary: 150 mg/dL — ABNORMAL HIGH (ref 65–99)

## 2017-02-15 LAB — OSMOLALITY, URINE: OSMOLALITY UR: 365 mosm/kg (ref 300–900)

## 2017-02-15 LAB — OSMOLALITY: OSMOLALITY: 271 mosm/kg — AB (ref 275–295)

## 2017-02-15 LAB — ABO/RH: ABO/RH(D): B POS

## 2017-02-15 LAB — APTT: aPTT: 49 seconds — ABNORMAL HIGH (ref 24–36)

## 2017-02-15 MED ORDER — SALINE SPRAY 0.65 % NA SOLN
2.0000 | NASAL | Status: DC
  Administered 2017-02-15 – 2017-02-20 (×41): 2 via NASAL
  Filled 2017-02-15: qty 44

## 2017-02-15 MED ORDER — ACETAMINOPHEN 650 MG RE SUPP: 650.0000 mg | Freq: Four times a day (QID) | RECTAL | Status: DC | PRN

## 2017-02-15 MED ORDER — ACETAMINOPHEN 325 MG PO TABS
650.0000 mg | ORAL_TABLET | Freq: Four times a day (QID) | ORAL | Status: DC | PRN
  Administered 2017-02-16: 650 mg via ORAL
  Filled 2017-02-15: qty 2

## 2017-02-15 MED ORDER — VANCOMYCIN HCL IN DEXTROSE 1-5 GM/200ML-% IV SOLN
1000.0000 mg | INTRAVENOUS | Status: AC
  Administered 2017-02-15: 1000 mg via INTRAVENOUS
  Filled 2017-02-15: qty 200

## 2017-02-15 MED ORDER — SODIUM CHLORIDE 0.9 % IV BOLUS (SEPSIS)
1000.0000 mL | Freq: Once | INTRAVENOUS | Status: AC
  Administered 2017-02-15: 1000 mL via INTRAVENOUS

## 2017-02-15 MED ORDER — ZOLPIDEM TARTRATE 5 MG PO TABS
5.0000 mg | ORAL_TABLET | Freq: Every evening | ORAL | Status: DC | PRN
  Administered 2017-02-15 – 2017-02-19 (×5): 5 mg via ORAL
  Filled 2017-02-15 (×5): qty 1

## 2017-02-15 MED ORDER — VANCOMYCIN HCL IN DEXTROSE 750-5 MG/150ML-% IV SOLN
750.0000 mg | Freq: Three times a day (TID) | INTRAVENOUS | Status: DC
  Administered 2017-02-15 – 2017-02-16 (×2): 750 mg via INTRAVENOUS
  Filled 2017-02-15 (×5): qty 150

## 2017-02-15 MED ORDER — PIPERACILLIN-TAZOBACTAM 3.375 G IVPB
3.3750 g | Freq: Three times a day (TID) | INTRAVENOUS | Status: AC
  Administered 2017-02-15 – 2017-02-19 (×15): 3.375 g via INTRAVENOUS
  Filled 2017-02-15 (×15): qty 50

## 2017-02-15 MED ORDER — SODIUM CHLORIDE 0.9% FLUSH
3.0000 mL | Freq: Two times a day (BID) | INTRAVENOUS | Status: DC
  Administered 2017-02-15 – 2017-02-19 (×6): 3 mL via INTRAVENOUS

## 2017-02-15 MED ORDER — ONDANSETRON HCL 4 MG PO TABS
8.0000 mg | ORAL_TABLET | Freq: Three times a day (TID) | ORAL | Status: DC | PRN
  Administered 2017-02-15 – 2017-02-17 (×2): 8 mg via ORAL
  Filled 2017-02-15 (×2): qty 2

## 2017-02-15 MED ORDER — ENSURE ENLIVE PO LIQD
237.0000 mL | Freq: Three times a day (TID) | ORAL | Status: DC
  Administered 2017-02-15 – 2017-02-19 (×12): 237 mL via ORAL
  Filled 2017-02-15 (×2): qty 237

## 2017-02-15 MED ORDER — SODIUM CHLORIDE 0.9 % IV SOLN
INTRAVENOUS | Status: DC
  Administered 2017-02-15: 06:00:00 via INTRAVENOUS

## 2017-02-15 MED ORDER — FERROUS SULFATE 325 (65 FE) MG PO TABS
325.0000 mg | ORAL_TABLET | Freq: Two times a day (BID) | ORAL | Status: DC
  Administered 2017-02-15 – 2017-02-20 (×10): 325 mg via ORAL
  Filled 2017-02-15 (×11): qty 1

## 2017-02-15 MED ORDER — SODIUM CHLORIDE 0.9 % IV BOLUS (SEPSIS)
1000.0000 mL | Freq: Once | INTRAVENOUS | Status: AC
Start: 2017-02-15 — End: 2017-02-15
  Administered 2017-02-15: 1000 mL via INTRAVENOUS

## 2017-02-15 MED ORDER — CALCIUM CARBONATE ANTACID 500 MG PO CHEW
1.0000 | CHEWABLE_TABLET | Freq: Every day | ORAL | Status: DC | PRN
  Administered 2017-02-16: 200 mg via ORAL
  Filled 2017-02-15 (×2): qty 1

## 2017-02-15 MED ORDER — SENNOSIDES-DOCUSATE SODIUM 8.6-50 MG PO TABS
1.0000 | ORAL_TABLET | Freq: Two times a day (BID) | ORAL | Status: DC
  Administered 2017-02-15 – 2017-02-20 (×10): 1 via ORAL
  Filled 2017-02-15 (×10): qty 1

## 2017-02-15 MED ORDER — FAMOTIDINE 20 MG PO TABS
20.0000 mg | ORAL_TABLET | Freq: Two times a day (BID) | ORAL | Status: DC
  Administered 2017-02-15 – 2017-02-20 (×11): 20 mg via ORAL
  Filled 2017-02-15 (×11): qty 1

## 2017-02-15 MED ORDER — OXYCODONE-ACETAMINOPHEN 5-325 MG PO TABS
1.0000 | ORAL_TABLET | ORAL | Status: DC | PRN
  Administered 2017-02-18: 1 via ORAL
  Filled 2017-02-15: qty 1

## 2017-02-15 MED ORDER — BISACODYL 5 MG PO TBEC
5.0000 mg | DELAYED_RELEASE_TABLET | Freq: Every day | ORAL | Status: DC | PRN
  Administered 2017-02-16: 5 mg via ORAL
  Filled 2017-02-15 (×2): qty 1

## 2017-02-15 NOTE — Procedures (Signed)
Preop diagnosis: Right epistaxis Postop diagnosis: same Procedure: Nasal endoscopy with cautery of anterior epistaxis Surgeon: Redmond Baseman Anesth: Topical with 4% lidocaine Compl: None Findings: Bloody crusting was removed from both sides of the nose.  Red, inflamed areas were identified on both sides of the anterior septum, more prominent on the right side. Description:  After discussing risks, benefits, and alternatives, the patient was placed in a seated position.  Each nasal passage was sprayed with topical anesthetic.  Bloody crusting was removed using forceps.  The fiberoptic laryngoscope was passed through each side of the nose.  Findings are noted above.  Each nasal passage was then further anesthetized with topical anesthetic soaked on cotton balls for a few minutes.  The cotton was removed and the potential bleeding sites were treated with silver nitrate on both sides.  He tolerated the procedure well and was returned to nursing care in stable condition.

## 2017-02-15 NOTE — Plan of Care (Signed)
Problem: Safety: Goal: Ability to remain free from injury will improve Outcome: Progressing Reviewed white board and how to reach RN/NT by phone numbers and give demonstration with return demonstration done, reviewed how to use the call light and everything is within reach, family members are in the room with patient and wife will be staying with patient, will continue to monitor.

## 2017-02-15 NOTE — Progress Notes (Signed)
Case reviewed and discussed with Dr. Grandville Silos.  Patient with history of recent DVT on Eliquis.  Admitted with epistaxis requiring cautery of inflamed bilateral anterior septum.  Hemostasis was achieved with procedure.  Anticoagulation on hold until 3/11 with plan for restart on heparin to monitor closely for bleeding. No need for IVC filter at this time.  Please call back if new needs arise or patient does not tolerate anticoagulation.    Noe Gens, NP-C [facility] Pgr: [phone] or if no answer [phone] 02/15/2017, 4:09 PM

## 2017-02-15 NOTE — H&P (Addendum)
History and Physical    Styles Fambro DJT:[id number] DOB: 11/14/1961 DOA: 02/15/2017  Referring MD/NP/PA:   PCP: Delman Cheadle, MD   Patient coming from:  The patient is coming from home.  At baseline, pt is independent for most of ADL.   Chief Complaint: nose bleeding  HPI: Justin [name] is a 56 y.o. 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  Patient was hospitalized from 03/6-3/9 due to newly diagnosed pulmonary embolism. He was started Eliquis at discharge. Pt states that he took two dose of Eliquis yesterday. He started having nose bleeding at about 10:00. He is having postnasal drainage mostly blood and coughing blood. Pt has generalized weakness, decreased oral intake, nausea, but no vomiting or diarrhea. He still has diffused mild abdominal pain. He still has mild cough and a shortness of breath. Patient denies chest pain, fever, chills, unilateral weakness, symptoms of UTI.  ED Course: pt was found to have slight hemoglobin dropped from 10.5 on 02/13/17-->10.0, WBC 29, lactate 2.50, sodium 126, creatinine normal, negative urinalysis, lipase 95, temperature 99.8, tachycardia, tachypnea, blood pressure 117/80. Patient is admitted to stepdown as inpatient. PCCM, Dr. Tamala Julian was consulted. ENT will be consulted by EDP.  Review of Systems:   General: no fevers, chills, has poor appetite, has fatigue HEENT: no blurry vision, hearing changes or sore throat. Has nose bleeding. Respiratory: has dyspnea, coughing, no wheezing CV: no chest pain, no palpitations GI: has nausea and AP. No vomiting, diarrhea, constipation GU: no dysuria, burning on urination, increased urinary frequency, hematuria  Ext: no leg edema Neuro: no unilateral weakness, numbness, or tingling, no vision change or hearing loss Skin: no rash, no skin tear. MSK: No muscle spasm, no deformity, no  limitation of range of movement in spin Heme: No easy bruising.  Travel history: No recent long distant travel.  Allergy:  Allergies  Allergen Reactions  . Lisinopril Other (See Comments)    coughing    Past Medical History:  Diagnosis Date  . Cancer ([place])   . Chronic hepatitis B virus infection ([place]) 02/15/2004   Annotation: 02/15/2004:  +Core Ab, +SAg, -SAb, -Be Ag with elevated liver  enzymes HBV DNA via PCR:  <2.3 AFP normal Evaluated by [facility] 07/24/2004--Dr. Patsy Baltimore Qualifier: Diagnosis of  By: Amil Amen MD, Benjamine Mola    . Diabetes mellitus without complication (HCC)    no meds  . High cholesterol 2006  . Hyperlipidemia   . Hypertension   . Prediabetes 08/31/2016   Lab Results Component Value Date  HGBA1C 6.2 (H) 08/30/2015      Past Surgical History:  Procedure Laterality Date  . EUS N/A 12/26/2016   Procedure: UPPER ENDOSCOPIC ULTRASOUND (EUS) LINEAR;  Surgeon: Milus Banister, MD;  Location: [facility] ENDOSCOPY;  Service: Endoscopy;  Laterality: N/A;  . FINE NEEDLE ASPIRATION N/A 12/26/2016   Procedure: FINE NEEDLE ASPIRATION (FNA) LINEAR;  Surgeon: Milus Banister, MD;  Location: [facility] ENDOSCOPY;  Service: Endoscopy;  Laterality: N/A;  . IR GENERIC HISTORICAL  12/19/2016   IR RADIOLOGIST EVAL & MGMT 12/19/2016 Arne Cleveland, MD [facility] INTER[facility] RAD  . IR GENERIC HISTORICAL  01/09/2017   IR ANGIOGRAM SELECTIVE EACH ADDITIONAL VESSEL 01/09/2017 Arne Cleveland, MD [facility]  . IR GENERIC HISTORICAL  01/09/2017   IR ANGIOGRAM VISCERAL SELECTIVE 01/09/2017 Arne Cleveland, MD [facility]  . IR GENERIC HISTORICAL  01/09/2017   IR ANGIOGRAM SELECTIVE EACH ADDITIONAL VESSEL 01/09/2017  Arne Cleveland, MD [facility]  . IR GENERIC HISTORICAL  01/09/2017   IR ANGIOGRAM VISCERAL SELECTIVE 01/09/2017 Arne Cleveland, MD [facility]  . IR GENERIC HISTORICAL  01/09/2017   IR ANGIOGRAM SELECTIVE EACH ADDITIONAL VESSEL 01/09/2017 Arne Cleveland, MD [facility]  . IR GENERIC HISTORICAL  01/09/2017    IR US GUIDE VASC ACCESS RIGHT 01/09/2017 Arne Cleveland, MD [facility]  . IR GENERIC HISTORICAL  01/09/2017   IR EMBO ARTERIAL NOT HEMORR HEMANG INC GUIDE ROADMAPPING 01/09/2017 Arne Cleveland, MD [facility]  . IR GENERIC HISTORICAL  01/30/2017   IR FLUORO GUIDE PORT INSERTION RIGHT 01/30/2017 Markus Daft, MD [facility]  . IR GENERIC HISTORICAL  01/30/2017   IR US GUIDE VASC ACCESS RIGHT 01/30/2017 Markus Daft, MD [facility]    Social History:  reports that he has never smoked. He has never used smokeless tobacco. He reports that he does not drink alcohol or use drugs.  Family History:  Family History  Problem Relation Age of Onset  . Heart disease Father   . Hyperlipidemia Father      Prior to Admission medications   Medication Sig Start Date End Date Taking? Authorizing Provider  apixaban (ELIQUIS) 5 MG TABS tablet Take 2 tablets (10 mg total) by mouth 2 (two) times daily. 02/14/17  Yes Debbe Odea, MD  bisacodyl (DULCOLAX) 5 MG EC tablet Take 5 mg by mouth daily as needed for moderate constipation.   Yes Historical Provider, MD  calcium carbonate (TUMS - DOSED IN MG ELEMENTAL CALCIUM) 500 MG chewable tablet Chew 1 tablet by mouth daily as needed for indigestion or heartburn.   Yes Historical Provider, MD  famotidine (PEPCID) 20 MG tablet Take 1 tablet (20 mg total) by mouth 2 (two) times daily. 02/14/17  Yes Debbe Odea, MD  feeding supplement (BOOST / RESOURCE BREEZE) LIQD Take 1 Container by mouth daily. 02/14/17  Yes Debbe Odea, MD  feeding supplement, ENSURE ENLIVE, (ENSURE ENLIVE) LIQD Take 237 mLs by mouth 3 (three) times daily between meals. 02/14/17  Yes Debbe Odea, MD  ferrous sulfate 325 (65 FE) MG tablet Take 325 mg by mouth 2 (two) times daily with a meal.   Yes Historical Provider, MD  lidocaine-prilocaine (EMLA) cream Apply to port site one hour prior to use. Do not rub in, cover with plastic. 01/31/17  Yes Ladell Pier, MD  naproxen sodium (ANAPROX) 220 MG tablet Take 220  mg by mouth 2 (two) times daily as needed (for pain/fever/headache.).   Yes Historical Provider, MD  ondansetron (ZOFRAN) 8 MG tablet Take 1 tablet (8 mg total) by mouth every 8 (eight) hours as needed for nausea or vomiting. 01/23/17  Yes Ladell Pier, MD  oxyCODONE-acetaminophen (PERCOCET/ROXICET) 5-325 MG tablet Take 1-2 tablets by mouth every 4 (four) hours as needed for severe pain. 01/23/17  Yes Ladell Pier, MD  potassium chloride SA (K-DUR,KLOR-CON) 20 MEQ tablet Take 1 tablet (20 mEq total) by mouth daily. 01/23/17  Yes Ladell Pier, MD  prochlorperazine (COMPAZINE) 10 MG tablet Take 1 tablet (10 mg total) by mouth every 6 (six) hours as needed for nausea or vomiting. 01/23/17  Yes Ladell Pier, MD  apixaban (ELIQUIS) 5 MG TABS tablet Take 1 tablet (5 mg total) by mouth 2 (two) times daily. 02/21/17   Debbe Odea, MD    Physical Exam: Vitals:   02/15/17 0400 02/15/17 0430 02/15/17 0500 02/15/17 0530  BP: 118/90 117/80 126/84 120/87  Pulse: (!) 123 (!) 124 Marland Kitchen)  124 (!) 123  Resp:  26 (!) 29 25  Temp:      TempSrc:      SpO2: 95% 94% 94% 94%  Weight:      Height:       General: Not in acute distress. Dry mucus and membrane. HEENT:       Eyes: PERRL, EOMI, no scleral icterus.       ENT: no pharynx injection, no tonsillar enlargement.        Neck: No JVD, no bruit, no mass felt. Heme: Has enlarged lymph node in L side of neck and Rgroin area.. Cardiac: S1/S2, RRR, No murmurs, No gallops or rubs. Respiratory: No rales, wheezing, rhonchi or rubs. GI: Soft, nondistended, has mild tenderness diffusely, no rebound pain, BS present. GU: No hematuria Ext: No pitting leg edema bilaterally. 2+DP/PT pulse bilaterally. Musculoskeletal: No joint deformities, No joint redness or warmth, no limitation of ROM in spin. Skin: No rashes.  Neuro: Alert, oriented X3, cranial nerves II-XII grossly intact, moves all extremities normally. Psych: Patient is not psychotic, no suicidal or  hemocidal ideation.  Labs on Admission: I have personally reviewed following labs and imaging studies  CBC:  Recent Labs Lab 02/12/17 0031 02/12/17 0959 02/13/17 0450 02/15/17 0141  WBC 15.3* 16.0* 18.4* 29.0*  NEUTROABS 11.4*  --   --  23.2*  HGB 10.5* 10.6* 10.5* 10.0*  HCT 32.0* 32.0* 32.7* 29.9*  MCV 76.7* 77.1* 77.9* 76.5*  PLT 244 262 293 841   Basic Metabolic Panel:  Recent Labs Lab 02/12/17 0031 02/12/17 0959 02/13/17 0450 02/15/17 0141  NA 127* 136 137 126*  K 3.2* 3.4* 3.6 3.6  CL 93* 103 104 94*  CO2 25 27 25  21*  GLUCOSE 174* 128* 104* 133*  BUN 12 9 11 12   CREATININE 0.67 0.71 0.69 0.72  CALCIUM 9.3 9.0 9.1 8.8*  MG  --  1.9  --   --    GFR: Estimated Creatinine Clearance: 90.8 mL/min (by C-G formula based on SCr of 0.72 mg/dL). Liver Function Tests:  Recent Labs Lab 02/12/17 0031 02/15/17 0141  AST 34 48*  ALT 27 28  ALKPHOS 133* 139*  BILITOT 0.6 1.0  PROT 6.7 6.7  ALBUMIN 2.6* 2.5*    Recent Labs Lab 02/15/17 0141  LIPASE 95*   No results for input(s): AMMONIA in the last 168 hours. Coagulation Profile:  Recent Labs Lab 02/12/17 0031  INR 1.24   Cardiac Enzymes:  Recent Labs Lab 02/12/17 0031 02/12/17 0959 02/12/17 1318 02/12/17 2300  TROPONINI 0.05* 0.06* 0.05* 0.08*   BNP (last 3 results) No results for input(s): PROBNP in the last 8760 hours. HbA1C: No results for input(s): HGBA1C in the last 72 hours. CBG:  Recent Labs Lab 02/12/17 0538 02/12/17 0758 02/13/17 0451 02/13/17 0836  GLUCAP 127* 110* 97 163*   Lipid Profile: No results for input(s): CHOL, HDL, LDLCALC, TRIG, CHOLHDL, LDLDIRECT in the last 72 hours. Thyroid Function Tests:  Recent Labs  02/12/17 0959  TSH 3.011   Anemia Panel: No results for input(s): VITAMINB12, FOLATE, FERRITIN, TIBC, IRON, RETICCTPCT in the last 72 hours. Urine analysis:    Component Value Date/Time   COLORURINE YELLOW 02/15/2017 0139   APPEARANCEUR CLEAR  02/15/2017 0139   LABSPEC 1.012 02/15/2017 0139   PHURINE 6.0 02/15/2017 0139   GLUCOSEU NEGATIVE 02/15/2017 0139   HGBUR NEGATIVE 02/15/2017 0139   HGBUR negative 01/18/2011 1006   BILIRUBINUR NEGATIVE 02/15/2017 0139   BILIRUBINUR small (A) 12/07/2016 0855  KETONESUR 20 (A) 02/15/2017 0139   PROTEINUR NEGATIVE 02/15/2017 0139   UROBILINOGEN 1.0 12/07/2016 0855   UROBILINOGEN 0.2 01/18/2011 1006   NITRITE NEGATIVE 02/15/2017 0139   LEUKOCYTESUR NEGATIVE 02/15/2017 0139   Sepsis Labs: @LABRCNTIP (procalcitonin:4,lacticidven:4) ) Recent Results (from the past 240 hour(s))  MRSA PCR Screening     Status: None   Collection Time: 02/12/17  4:51 AM  Result Value Ref Range Status   MRSA by PCR NEGATIVE NEGATIVE Final    Comment:        The GeneXpert MRSA Assay (FDA approved for NASAL specimens only), is one component of a comprehensive MRSA colonization surveillance program. It is not intended to diagnose MRSA infection nor to guide or monitor treatment for MRSA infections.      Radiological Exams on Admission: No results found.   EKG: Not done in ED, will get one.   Assessment/Plan Principal Problem:   PE (pulmonary thromboembolism) (HCC) Active Problems:   Essential hypertension   Hepatocellular carcinoma (HCC)   Hyponatremia   Leukocytosis   Protein-calorie malnutrition, severe   Bleeding from the nose   Sepsis ([place])   PE (pulmonary thromboembolism) ([place]): pt was diagnosed with submassive PE on 02/12/16.  CTA suggests right heart strain but ECHO reveals only grade 1 dCHF without any other abnormalities. No DVT on LE venous doppler. Pt was started Eliquis at discharge. Now develops nose bleeding. Hgb 10.5-->10.0. Patient is a tachycardia, but hemodynamically stable. PCCM, Dr. Tamala Julian was consulted--> recommended to consult ENT. ENT will be consulted by EDP.  -will admit to SDU as inpt -will hold Eliquis now.  -hold naproxen -ENT will be consulted by ENT.  -check  INT/ptt and type screen -repeat CBC in AM.  -NPO now  Nose bleeding: -see above  Possible sepsis: pt meets criteria for sepsis with leukocytosis, elevated lactate, tachycardia, tachypnea. Temperature 99.8. Source of infection is not clear. Urinalysis is negative. Currently hemodynamically stable. -will get blood culture and urine culture -get CXR -start IV vanco and zosyn --will get Procalcitonin and trend lactic acid levels per sepsis protocol. -IVF: 2L of NS bolus in ED, followed by 100 cc/h   HTN: bp meds on hold at discharge due to hypotension in place on admission. Blood pressure 117/80 today. -hold Bp -monitoring bp closely  Prediabetes: Last A1c 7.1 on 12/07/16. Not taking meds at home. bp sugar 133 on admission -check CBG every morning.   Hyponatremia: Na 126. Mental status normal. Likely due to decreased oral intake. TSH 3.011 on 02/12/17. - Will check urine sodium, urine osmolality, serum osmolality. - IVF: 2 L NS in ED, will continue with IV normal saline at 100 mL/h - check BMP q6h  Protein-calorie malnutrition, severe -continue nutritional supplement when able to eat  GERD: -Protonix  Metastasized Hepatocellular carcinoma: f/u with Dr. Benay Spice. Had one dose of chemo on 01/31/17. CTA of chest showed possibly increased right hepatic lobe mass, extensive bulky mediastinal, mild mass effect on the left atrium which is flattened posteriorlyand right hilar adenopathy, scattered bilateral pulmonary nodules several of which are new from previous, and may also reflect metastatic disease.  -please inform Dr. Benay Spice of pt admission -prn zofran for nausea -Continue home percocet.  DVT ppx: None (pt has nose bleeding cannot use heparin or lovenex; has newly dignosed PE-->cannot use SCD) Code Status: Full code Family Communication: Yes, patient's wife at bed side Disposition Plan:  Anticipate discharge back to previous home environment Consults called:  PCCM, Dr. Tamala Julian was  consulted. ENT will  be consulted by EDP. Admission status:  SDU/inpation       Date of Service 02/15/2017    Ivor Costa [facility] Pager [phone]  If 7PM-7AM, please contact night-coverage [email or web address] Password [email or web address] 02/15/2017, 6:03 AM

## 2017-02-15 NOTE — ED Triage Notes (Signed)
Pt states he has been nose bleeding and having postnasal drainage mostly blood and coughing blood. Of note pt was discharged earlier today post inpatient treatment.

## 2017-02-15 NOTE — ED Notes (Signed)
[facility] picking up patient to take to [place]. 

## 2017-02-15 NOTE — ED Provider Notes (Signed)
[facility] DEPT Provider Note   CSN: [id number] Arrival date & time: 02/14/17  2342   By signing my name below, I, Charolotte Eke, attest that this documentation has been prepared under the direction and in the presence of [employer], PA-C. Electronically Signed: Charolotte Eke, Scribe. 02/15/17. 1:42 AM.    History   Chief Complaint Chief Complaint  Patient presents with  . Epistaxis  . Abdominal Pain    HPI Justin [name] is a 56 y.o. male with h/o HTN, HLD, Dm, liver cancer (diagnosed 12/12/16; chemotherapy started 01/31/17) who presents to the Emergency Department complaining of  persistent epistaxis that started at 10 pm yesterday. Patient is also feeling increased weakness since discharge from the hospital Pt has recently started taking Eliquis after his recent hospitalization (from 3/7-9/18 at [facility]) due to a blood clot. Pt is having associated abdominal pain that is chronic due to his liver cancer.  She also ways of some coughing Pt states that his right nares is bleeding more. Pt has no h/o abdominal surgeries. The patient denies chest pain, , headache,blurred vision, neck pain, fever,  numbness, dizziness, anorexia, edema, nausea, diarrhea, rash, back pain, dysuria, hematemesis, bloody stool, near syncope, or syncope.   The history is provided by the patient. No language interpreter was used.    Past Medical History:  Diagnosis Date  . Cancer ([place])   . Chronic hepatitis B virus infection ([place]) 02/15/2004   Annotation: 02/15/2004:  +Core Ab, +SAg, -SAb, -Be Ag with elevated liver  enzymes HBV DNA via PCR:  <2.3 AFP normal Evaluated by [facility] 07/24/2004--Dr. Patsy [place] Qualifier: Diagnosis of  By: Amil Amen MD, Benjamine Mola    . Diabetes mellitus without complication (HCC)    no meds  . High cholesterol 2006  . Hyperlipidemia   . Hypertension   . Prediabetes 08/31/2016   Lab Results Component Value Date  HGBA1C 6.2 (H) 08/30/2015      Patient Active Problem List   Diagnosis Date Noted  . Protein-calorie malnutrition, severe 02/14/2017  . PE (pulmonary thromboembolism) ([place]) 02/12/2017  . Elevated troponin 02/12/2017  . Hyponatremia 02/12/2017  . Leukocytosis 02/12/2017  . Hypercalcemia 01/23/2017  . Goals of care, counseling/discussion 01/23/2017  . Mediastinal adenopathy   . Hepatocellular carcinoma ([place]) 12/16/2016  . Hepatitis C 09/02/2016  . Diabetes ([place]) 09/02/2016  . Prediabetes 08/31/2016  . Dyslipidemia 11/20/2010  . Essential hypertension 11/20/2010  . Chronic hepatitis B virus infection ([place]) 02/15/2004    Past Surgical History:  Procedure Laterality Date  . EUS N/A 12/26/2016   Procedure: UPPER ENDOSCOPIC ULTRASOUND (EUS) LINEAR;  Surgeon: Milus Banister, MD;  Location: [facility] ENDOSCOPY;  Service: Endoscopy;  Laterality: N/A;  . FINE NEEDLE ASPIRATION N/A 12/26/2016   Procedure: FINE NEEDLE ASPIRATION (FNA) LINEAR;  Surgeon: Milus Banister, MD;  Location: [facility] ENDOSCOPY;  Service: Endoscopy;  Laterality: N/A;  . IR GENERIC HISTORICAL  12/19/2016   IR RADIOLOGIST EVAL & MGMT 12/19/2016 Arne Cleveland, MD [facility] RAD  . IR GENERIC HISTORICAL  01/09/2017   IR ANGIOGRAM SELECTIVE EACH ADDITIONAL VESSEL 01/09/2017 Arne Cleveland, MD [facility]  . IR GENERIC HISTORICAL  01/09/2017   IR ANGIOGRAM VISCERAL SELECTIVE 01/09/2017 Arne Cleveland, MD [facility]  . IR GENERIC HISTORICAL  01/09/2017   IR ANGIOGRAM SELECTIVE EACH ADDITIONAL VESSEL 01/09/2017 Arne Cleveland, MD [facility]  . IR GENERIC HISTORICAL  01/09/2017   IR ANGIOGRAM VISCERAL SELECTIVE 01/09/2017 Arne Cleveland, MD [facility]  . IR GENERIC HISTORICAL  01/09/2017  IR ANGIOGRAM SELECTIVE EACH ADDITIONAL VESSEL 01/09/2017 Arne Cleveland, MD [facility]  . IR GENERIC HISTORICAL  01/09/2017   IR US GUIDE VASC ACCESS RIGHT 01/09/2017 Arne Cleveland, MD [facility]  . IR GENERIC HISTORICAL  01/09/2017   IR EMBO ARTERIAL NOT HEMORR HEMANG INC GUIDE ROADMAPPING 01/09/2017 Arne Cleveland, MD  [facility]  . IR GENERIC HISTORICAL  01/30/2017   IR FLUORO GUIDE PORT INSERTION RIGHT 01/30/2017 Markus Daft, MD [facility]  . IR GENERIC HISTORICAL  01/30/2017   IR US GUIDE VASC ACCESS RIGHT 01/30/2017 Markus Daft, MD [facility]       Home Medications    Prior to Admission medications   Medication Sig Start Date End Date Taking? Authorizing Provider  apixaban (ELIQUIS) 5 MG TABS tablet Take 2 tablets (10 mg total) by mouth 2 (two) times daily. 02/14/17   Debbe Odea, MD  apixaban (ELIQUIS) 5 MG TABS tablet Take 1 tablet (5 mg total) by mouth 2 (two) times daily. 02/21/17   Debbe Odea, MD  bisacodyl (DULCOLAX) 5 MG EC tablet Take 5 mg by mouth daily as needed for moderate constipation.    Historical Provider, MD  calcium carbonate (TUMS - DOSED IN MG ELEMENTAL CALCIUM) 500 MG chewable tablet Chew 1 tablet by mouth daily as needed for indigestion or heartburn.    Historical Provider, MD  famotidine (PEPCID) 20 MG tablet Take 1 tablet (20 mg total) by mouth 2 (two) times daily. 02/14/17   Debbe Odea, MD  feeding supplement (BOOST / RESOURCE BREEZE) LIQD Take 1 Container by mouth daily. 02/14/17   Debbe Odea, MD  feeding supplement, ENSURE ENLIVE, (ENSURE ENLIVE) LIQD Take 237 mLs by mouth 3 (three) times daily between meals. 02/14/17   Debbe Odea, MD  ferrous sulfate 325 (65 FE) MG tablet Take 325 mg by mouth 2 (two) times daily with a meal.    Historical Provider, MD  lidocaine-prilocaine (EMLA) cream Apply to port site one hour prior to use. Do not rub in, cover with plastic. 01/31/17   Ladell Pier, MD  naproxen sodium (ANAPROX) 220 MG tablet Take 220 mg by mouth 2 (two) times daily as needed (for pain/fever/headache.).    Historical Provider, MD  ondansetron (ZOFRAN) 8 MG tablet Take 1 tablet (8 mg total) by mouth every 8 (eight) hours as needed for nausea or vomiting. 01/23/17   Ladell Pier, MD  oxyCODONE-acetaminophen (PERCOCET/ROXICET) 5-325 MG tablet Take 1-2 tablets by mouth  every 4 (four) hours as needed for severe pain. 01/23/17   Ladell Pier, MD  potassium chloride SA (K-DUR,KLOR-CON) 20 MEQ tablet Take 1 tablet (20 mEq total) by mouth daily. 01/23/17   Ladell Pier, MD  prochlorperazine (COMPAZINE) 10 MG tablet Take 1 tablet (10 mg total) by mouth every 6 (six) hours as needed for nausea or vomiting. 01/23/17   Ladell Pier, MD    Family History Family History  Problem Relation Age of Onset  . Heart disease Father   . Hyperlipidemia Father     Social History Social History  Substance Use Topics  . Smoking status: Never Smoker  . Smokeless tobacco: Never Used  . Alcohol use No     Allergies   Lisinopril   Review of Systems Review of Systems  HENT: Positive for nosebleeds.        Epistaxis  Gastrointestinal: Positive for abdominal pain. Negative for blood in stool, diarrhea and vomiting.    A complete 10 system review of systems was  obtained and all systems are negative except as noted in the HPI and PMH.    Physical Exam Updated Vital Signs BP 112/85 (BP Location: Right Arm)   Pulse (!) 137   Temp 99.8 F (37.7 C) (Oral)   Resp 16   Ht 5\' 6"  (1.676 m)   Wt 150 lb (68 kg)   SpO2 97%   BMI 24.21 kg/m   Physical Exam  Constitutional: He is oriented to person, place, and time. He appears well-developed and well-nourished. No distress.  HENT:  Head: Normocephalic and atraumatic.  Dried blood in left and right nares. No active bleeding on exam. Blood observed in posterior oropharynx  Eyes: Pupils are equal, round, and reactive to light.  Neck: Normal range of motion. Neck supple.  Cardiovascular: Regular rhythm and normal heart sounds.  Tachycardia present.  Exam reveals no gallop and no friction rub.   No murmur heard. Pulmonary/Chest: Effort normal and breath sounds normal. No respiratory distress. He has no wheezes.  Abdominal: Soft. Bowel sounds are normal. He exhibits no distension.  Musculoskeletal: Normal range of  motion. He exhibits no edema.  Neurological: He is alert and oriented to person, place, and time. He exhibits normal muscle tone. Coordination normal.  Skin: Skin is warm and dry. Capillary refill takes less than 2 seconds. No rash noted. No erythema.  Psychiatric: He has a normal mood and affect. His behavior is normal.  Nursing note and vitals reviewed.    ED Treatments / Results   DIAGNOSTIC STUDIES: Oxygen Saturation is 97% on room air, normal by my interpretation.    COORDINATION OF CARE: 1:24 AM Discussed treatment plan with pt at bedside and pt agreed to plan, which includes EKG.    Labs (all labs ordered are listed, but only abnormal results are displayed) Labs Reviewed - No data to display  EKG  EKG Interpretation None       Radiology No results found.  Procedures Procedures (including critical care time)  Medications Ordered in ED Medications - No data to display   Initial Impression / Assessment and Plan / ED Course  I have reviewed the triage vital signs and the nursing notes.  Pertinent labs & imaging results that were available during my care of the patient were reviewed by me and considered in my medical decision making (see chart for details).     I spoke with Dr. Redmond Baseman, ENT, about the hospitalist request.  He will see the patient in the hospital.  Patient will be admitted for worsening in his condition.  Increase weakness along with tachycardia and lab abnormalities that are somewhat chronic but worsening.  Patient and family are made aware the plan and all questions were answered Final Clinical Impressions(s) / ED Diagnoses   Final diagnoses:  None    New Prescriptions New Prescriptions   No medications on file  I personally performed the services described in this documentation, which was scribed in my presence. The recorded information has been reviewed and is accurate.    Dalia Heading, PA-C 02/15/17 7829    Gwenyth Allegra  Tegeler, MD 02/15/17 [phone]

## 2017-02-15 NOTE — ED Notes (Signed)
[facility] here to transfer pt to [facility]. 

## 2017-02-15 NOTE — ED Notes (Signed)
Pt has a lactic acid of 2.50 EDP Tegler and Rn Allen made aware.

## 2017-02-15 NOTE — Progress Notes (Signed)
Pharmacy Antibiotic Note  Justin [name] is a 56 y.o. male admitted on 02/15/2017 with sepsis.  Pharmacy has been consulted for Vancomycin dosing.  Plan:  Vancomycin 1gm IV x 1 followed by 750mg  IV q8h  Vancomycin trough : 15-20 mcg/ml  Follow SCR daily  Zosyn per MD  F/U research data  Height: 5\' 6"  (167.6 cm) Weight: 150 lb (68 kg) IBW/kg (Calculated) : 63.8  Temp (24hrs), Avg:99.8 F (37.7 C), Min:99.8 F (37.7 C), Max:99.8 F (37.7 C)   Recent Labs Lab 02/12/17 0031 02/12/17 0959 02/13/17 0450 02/15/17 0141 02/15/17 0305 02/15/17 0327  WBC 15.3* 16.0* 18.4* 29.0*  --   --   CREATININE 0.67 0.71 0.69 0.72  --   --   LATICACIDVEN  --   --   --   --  2.50* 2.8*    Estimated Creatinine Clearance: 90.8 mL/min (by C-G formula based on SCr of 0.72 mg/dL).    Allergies  Allergen Reactions  . Lisinopril Other (See Comments)    coughing    Antimicrobials this admission: 3/10 vanc >>   3/10 zosyn >>    Dose adjustments this admission:    Microbiology results: 3/10 BCx: sent 3/10 UCx: sent   Thank you for allowing pharmacy to be a part of this patient's care.  Everette Rank, PharmD 02/15/2017 5:29 AM

## 2017-02-15 NOTE — Progress Notes (Signed)
I have seen and assessed patient and agree with Dr. Edgar Frisk assessment and plan. Patient is a pleasant unfortunate 56 year old gentleman with a history of HCV, hepatocellular carcinoma, diabetes recently hospitalized from 02/11/2017 - 02/14/2017 secondary to acute bilateral pulmonary emboli but mildly elevated troponins on anticoagulation, noted to be tachycardic, hypertension with abdominal pain who presented to the ED with nosebleeds and noted to be in sepsis. Patient has been assessed by ENT and underwent nasal endoscopy with cautery of anterior epistaxis. Anticoagulation on hold. Will place on IV heparin in 24 hours and monitor closely prior to transitioning to oral NOAC. Patient has been pancultured and currently empirically on broad-spectrum antibiotics.  No charge.

## 2017-02-15 NOTE — Progress Notes (Addendum)
CRITICAL VALUE ALERT  Critical value received: Lactic Acid 2.2  Date of notification:  02/15/17  Time of notification:  0648  Critical value read back:yes  Nurse who received alert:  Loma Boston, RN from [facility]  MD notified (1st page):  Dr. Grandville Silos  Time of first page:  0702  MD notified (2nd page):  Time of second page:  Responding MD:  Dr. Grandville Silos  Time MD responded: [phone]  No new orders at this time.

## 2017-02-15 NOTE — Plan of Care (Signed)
Problem: Safety: Goal: Ability to remain free from injury will improve Outcome: Progressing Bedrest. Call PRN needs. Family at bedside.

## 2017-02-15 NOTE — ED Notes (Signed)
Dr. Niu, hospitalist, at bedside. 

## 2017-02-15 NOTE — Consult Note (Signed)
Reason for Consult: Nosebleed Referring Physician: Hospitalist  Justin [name] is an 56 y.o. male.  HPI: 56 year old male with multiple medical problems was recently admitted to the hospital for pulmonary emboli and started on anti-coagulation.  He was discharged on Elaquis.  Last night, he started bleeding from the right side of the nose and bleeding would not stop so he came to the ER.  He was admitted to the hospital this morning and bleeding stopped at about 0600.  Anti-coagulation is being held.  He is also being treated for potential sepsis.  Consultation was requested to manage epistaxis since he needs anti-coagulation.  Past Medical History:  Diagnosis Date  . Cancer ([place])   . Chronic hepatitis B virus infection ([place]) 02/15/2004   Annotation: 02/15/2004:  +Core Ab, +SAg, -SAb, -Be Ag with elevated liver  enzymes HBV DNA via PCR:  <2.3 AFP normal Evaluated by [facility] 07/24/2004--Dr. Patsy [place] Qualifier: Diagnosis of  By: Amil Amen MD, Benjamine Mola    . Diabetes mellitus without complication (HCC)    no meds  . High cholesterol 2006  . Hyperlipidemia   . Hypertension   . Prediabetes 08/31/2016   Lab Results Component Value Date  HGBA1C 6.2 (H) 08/30/2015      Past Surgical History:  Procedure Laterality Date  . EUS N/A 12/26/2016   Procedure: UPPER ENDOSCOPIC ULTRASOUND (EUS) LINEAR;  Surgeon: Milus Banister, MD;  Location: [facility];  Service: Endoscopy;  Laterality: N/A;  . FINE NEEDLE ASPIRATION N/A 12/26/2016   Procedure: FINE NEEDLE ASPIRATION (FNA) LINEAR;  Surgeon: Milus Banister, MD;  Location: [facility];  Service: Endoscopy;  Laterality: N/A;  . IR GENERIC HISTORICAL  12/19/2016   IR RADIOLOGIST EVAL & MGMT 12/19/2016 Arne Cleveland, MD [facility]  . IR GENERIC HISTORICAL  01/09/2017   IR ANGIOGRAM SELECTIVE EACH ADDITIONAL VESSEL 01/09/2017 Arne Cleveland, MD [facility]  . IR GENERIC HISTORICAL  01/09/2017   IR ANGIOGRAM VISCERAL SELECTIVE 01/09/2017 Arne Cleveland, MD [facility]  . IR GENERIC HISTORICAL  01/09/2017   IR ANGIOGRAM SELECTIVE EACH ADDITIONAL VESSEL 01/09/2017 Arne Cleveland, MD [facility]  . IR GENERIC HISTORICAL  01/09/2017   IR ANGIOGRAM VISCERAL SELECTIVE 01/09/2017 Arne Cleveland, MD [facility]  . IR GENERIC HISTORICAL  01/09/2017   IR ANGIOGRAM SELECTIVE EACH ADDITIONAL VESSEL 01/09/2017 Arne Cleveland, MD [facility]  . IR GENERIC HISTORICAL  01/09/2017   IR US GUIDE VASC ACCESS RIGHT 01/09/2017 Arne Cleveland, MD [facility]  . IR GENERIC HISTORICAL  01/09/2017   IR EMBO ARTERIAL NOT HEMORR HEMANG INC GUIDE ROADMAPPING 01/09/2017 Arne Cleveland, MD [facility]  . IR GENERIC HISTORICAL  01/30/2017   IR FLUORO GUIDE PORT INSERTION RIGHT 01/30/2017 Markus Daft, MD [facility]  . IR GENERIC HISTORICAL  01/30/2017   IR US GUIDE VASC ACCESS RIGHT 01/30/2017 Markus Daft, MD [facility]    Family History  Problem Relation Age of Onset  . Heart disease Father   . Hyperlipidemia Father     Social History:  reports that he has never smoked. He has never used smokeless tobacco. He reports that he does not drink alcohol or use drugs.  Allergies:  Allergies  Allergen Reactions  . Lisinopril Other (See Comments)    coughing    Medications: I have reviewed the patient's current medications.  Results for orders placed or performed during the hospital encounter of 02/15/17 (from the past 48 hour(s))  Urinalysis, Routine w reflex microscopic  Status: Abnormal   Collection Time: 02/15/17  1:39 AM  Result Value Ref Range   Color, Urine YELLOW YELLOW   APPearance CLEAR CLEAR   Specific Gravity, Urine 1.012 1.005 - 1.030   pH 6.0 5.0 - 8.0   Glucose, UA NEGATIVE NEGATIVE mg/dL   Hgb urine dipstick NEGATIVE NEGATIVE   Bilirubin Urine NEGATIVE NEGATIVE   Ketones, ur 20 (A) NEGATIVE mg/dL   Protein, ur NEGATIVE NEGATIVE mg/dL   Nitrite NEGATIVE NEGATIVE   Leukocytes, UA NEGATIVE NEGATIVE  Comprehensive metabolic panel     Status:  Abnormal   Collection Time: 02/15/17  1:41 AM  Result Value Ref Range   Sodium 126 (L) 135 - 145 mmol/L    Comment: DELTA CHECK NOTED REPEATED TO VERIFY    Potassium 3.6 3.5 - 5.1 mmol/L   Chloride 94 (L) 101 - 111 mmol/L   CO2 21 (L) 22 - 32 mmol/L   Glucose, Bld 133 (H) 65 - 99 mg/dL   BUN 12 6 - 20 mg/dL   Creatinine, Ser 0.72 0.61 - 1.24 mg/dL   Calcium 8.8 (L) 8.9 - 10.3 mg/dL   Total Protein 6.7 6.5 - 8.1 g/dL   Albumin 2.5 (L) 3.5 - 5.0 g/dL   AST 48 (H) 15 - 41 U/L   ALT 28 17 - 63 U/L   Alkaline Phosphatase 139 (H) 38 - 126 U/L   Total Bilirubin 1.0 0.3 - 1.2 mg/dL   GFR calc non Af Amer >60 >60 mL/min   GFR calc Af Amer >60 >60 mL/min    Comment: (NOTE) The eGFR has been calculated using the CKD EPI equation. This calculation has not been validated in all clinical situations. eGFR's persistently <60 mL/min signify possible Chronic Kidney Disease.    Anion gap 11 5 - 15  Lipase, blood     Status: Abnormal   Collection Time: 02/15/17  1:41 AM  Result Value Ref Range   Lipase 95 (H) 11 - 51 U/L    Comment: RESULTS CONFIRMED BY MANUAL DILUTION  CBC with Differential     Status: Abnormal   Collection Time: 02/15/17  1:41 AM  Result Value Ref Range   WBC 29.0 (H) 4.0 - 10.5 K/uL   RBC 3.91 (L) 4.22 - 5.81 MIL/uL   Hemoglobin 10.0 (L) 13.0 - 17.0 g/dL   HCT 29.9 (L) 39.0 - 52.0 %   MCV 76.5 (L) 78.0 - 100.0 fL   MCH 25.6 (L) 26.0 - 34.0 pg   MCHC 33.4 30.0 - 36.0 g/dL   RDW 16.2 (H) 11.5 - 15.5 %   Platelets 332 150 - 400 K/uL   Neutrophils Relative % 80 %   Lymphocytes Relative 7 %   Monocytes Relative 13 %   Eosinophils Relative 0 %   Basophils Relative 0 %   Neutro Abs 23.2 (H) 1.7 - 7.7 K/uL   Lymphs Abs 2.0 0.7 - 4.0 K/uL   Monocytes Absolute 3.8 (H) 0.1 - 1.0 K/uL   Eosinophils Absolute 0.0 0.0 - 0.7 K/uL   Basophils Absolute 0.0 0.0 - 0.1 K/uL   WBC Morphology WHITE COUNT CONFIRMED ON SMEAR    Smear Review MORPHOLOGY UNREMARKABLE   I-Stat CG4 Lactic  Acid, ED     Status: Abnormal   Collection Time: 02/15/17  3:05 AM  Result Value Ref Range   Lactic Acid, Venous 2.50 (HH) 0.5 - 1.9 mmol/L   Comment NOTIFIED PHYSICIAN   Lactic acid, plasma     Status: Abnormal  Collection Time: 02/15/17  3:27 AM  Result Value Ref Range   Lactic Acid, Venous 2.8 (HH) 0.5 - 1.9 mmol/L    Comment: CRITICAL RESULT CALLED TO, READ BACK BY AND VERIFIED WITH: OWOLABI,A RN [phone] COVINGTON,N   Osmolality     Status: Abnormal   Collection Time: 02/15/17  5:33 AM  Result Value Ref Range   Osmolality 271 (L) 275 - 295 mOsm/kg    Comment: Performed at [facility] [address] [place], [place] [address]  Basic metabolic panel     Status: Abnormal   Collection Time: 02/15/17  5:33 AM  Result Value Ref Range   Sodium 129 (L) 135 - 145 mmol/L   Potassium 3.8 3.5 - 5.1 mmol/L   Chloride 100 (L) 101 - 111 mmol/L   CO2 20 (L) 22 - 32 mmol/L   Glucose, Bld 112 (H) 65 - 99 mg/dL   BUN 10 6 - 20 mg/dL   Creatinine, Ser 0.67 0.61 - 1.24 mg/dL   Calcium 8.2 (L) 8.9 - 10.3 mg/dL   GFR calc non Af Amer >60 >60 mL/min   GFR calc Af Amer >60 >60 mL/min    Comment: (NOTE) The eGFR has been calculated using the CKD EPI equation. This calculation has not been validated in all clinical situations. eGFR's persistently <60 mL/min signify possible Chronic Kidney Disease.    Anion gap 9 5 - 15  Protime-INR     Status: Abnormal   Collection Time: 02/15/17  5:33 AM  Result Value Ref Range   Prothrombin Time 21.7 (H) 11.4 - 15.2 seconds   INR 1.86   APTT     Status: Abnormal   Collection Time: 02/15/17  5:33 AM  Result Value Ref Range   aPTT 49 (H) 24 - 36 seconds    Comment:        IF BASELINE aPTT IS ELEVATED, SUGGEST PATIENT RISK ASSESSMENT BE USED TO DETERMINE APPROPRIATE ANTICOAGULANT THERAPY.   Procalcitonin     Status: None   Collection Time: 02/15/17  5:33 AM  Result Value Ref Range   Procalcitonin 0.83 ng/mL    Comment:         Interpretation: PCT > 0.5 ng/mL and <= 2 ng/mL: Systemic infection (sepsis) is possible, but other conditions are known to elevate PCT as well. (NOTE)         ICU PCT Algorithm               Non ICU PCT Algorithm    ----------------------------     ------------------------------         PCT < 0.25 ng/mL                 PCT < 0.1 ng/mL     Stopping of antibiotics            Stopping of antibiotics       strongly encouraged.               strongly encouraged.    ----------------------------     ------------------------------       PCT level decrease by               PCT < 0.25 ng/mL       >= 80% from peak PCT       OR PCT 0.25 - 0.5 ng/mL          Stopping of antibiotics  encouraged.     Stopping of antibiotics           encouraged.    ----------------------------     ------------------------------       PCT level decrease by              PCT >= 0.25 ng/mL       < 80% from peak PCT        AND PCT >= 0.5 ng/mL             Continuing antibiotics                                              encouraged.       Continuing antibiotics            encouraged.    ----------------------------     ------------------------------     PCT level increase compared          PCT > 0.5 ng/mL         with peak PCT AND          PCT >= 0.5 ng/mL             Escalation of antibiotics                                          strongly encouraged.      Escalation of antibiotics        strongly encouraged.   Type and screen [place]     Status: None   Collection Time: 02/15/17  5:38 AM  Result Value Ref Range   ABO/RH(D) B POS    Antibody Screen NEG    Sample Expiration 02/18/2017   Lactic acid, plasma     Status: Abnormal   Collection Time: 02/15/17  5:38 AM  Result Value Ref Range   Lactic Acid, Venous 2.2 (HH) 0.5 - 1.9 mmol/L    Comment: CRITICAL RESULT CALLED TO, READ BACK BY AND VERIFIED WITH: MARQUEZ,A RN [phone]  COVINGTON,N   ABO/Rh     Status: None   Collection Time: 02/15/17  5:38 AM  Result Value Ref Range   ABO/RH(D) B POS   Glucose, capillary     Status: Abnormal   Collection Time: 02/15/17  7:50 AM  Result Value Ref Range   Glucose-Capillary 118 (H) 65 - 99 mg/dL    Dg Chest Port 1 View  Result Date: 02/15/2017 CLINICAL DATA:  Sepsis.  Hemoptysis. EXAM: PORTABLE CHEST 1 VIEW COMPARISON:  02/12/2017 FINDINGS: Right jugular port terminates in the low SVC. Stable elevation of the right hemidiaphragm. No airspace consolidation. No large effusion. Normal pulmonary vascular IMPRESSION: No acute cardiopulmonary findings. Electronically Signed   By: Andreas Newport M.D.   On: 02/15/2017 06:02    Review of Systems  HENT: Positive for nosebleeds.   Respiratory: Positive for sputum production and shortness of breath.   All other systems reviewed and are negative.  Blood pressure 120/86, pulse (!) 124, temperature 99 F (37.2 C), temperature source Oral, resp. rate (!) 24, height '5\' 6"'  (1.676 m), weight 159 lb 9.6 oz (72.4 kg), SpO2 97 %. Physical Exam  Constitutional: He is oriented to person, place, and time. He appears well-developed and well-nourished. No distress.  HENT:  Head: Normocephalic and atraumatic.  Right Ear: External ear normal.  Left Ear: External ear normal.  Nose: Nose normal.  Mouth/Throat: Oropharynx is clear and moist.  No active nose bleeding.  No blood in oropharynx.  Crusted bloody debris in both nasal passages.  Eyes: Conjunctivae and EOM are normal. Pupils are equal, round, and reactive to light.  Neck: Normal range of motion. Neck supple.  Cardiovascular: Normal rate.   Respiratory: Effort normal.  Musculoskeletal: Normal range of motion.  Neurological: He is alert and oriented to person, place, and time. No cranial nerve deficit.  Skin: Skin is warm and dry.  Psychiatric: He has a normal mood and affect. His behavior is normal. Judgment and thought content  normal.    Assessment/Plan: Right epistaxis, anti-coagulation Bloody crusting was removed from both sides of the nose.  Nasal endoscopy was performed.  Potential bleeding sites on both sides of the septum were treated with silver nitrate.  See procedure note.  I discussed his situation with his hospitalist.  I recommended resuming anti-coagulation tomorrow if there remains no bleeding.  I asked the patient to avoid blowing or picking the nose.  I ordered saline spray several times per day.  He can follow-up in two weeks.  Call back if bleeding resumes.  Corben Auzenne 02/15/2017, 11:21 AM

## 2017-02-15 NOTE — ED Notes (Signed)
[facility] was notified of pt's need of transportation to [facility]. 

## 2017-02-16 LAB — CBC WITH DIFFERENTIAL/PLATELET
BASOS PCT: 0 %
Basophils Absolute: 0 10*3/uL (ref 0.0–0.1)
EOS ABS: 0 10*3/uL (ref 0.0–0.7)
EOS PCT: 0 %
HEMATOCRIT: 25.8 % — AB (ref 39.0–52.0)
HEMOGLOBIN: 8.3 g/dL — AB (ref 13.0–17.0)
LYMPHS PCT: 8 %
Lymphs Abs: 2.1 10*3/uL (ref 0.7–4.0)
MCH: 24.9 pg — AB (ref 26.0–34.0)
MCHC: 32.2 g/dL (ref 30.0–36.0)
MCV: 77.5 fL — AB (ref 78.0–100.0)
MONOS PCT: 10 %
Monocytes Absolute: 2.6 10*3/uL — ABNORMAL HIGH (ref 0.1–1.0)
NEUTROS ABS: 21.7 10*3/uL — AB (ref 1.7–7.7)
NEUTROS PCT: 82 %
Platelets: 308 10*3/uL (ref 150–400)
RBC: 3.33 MIL/uL — ABNORMAL LOW (ref 4.22–5.81)
RDW: 16.5 % — ABNORMAL HIGH (ref 11.5–15.5)
WBC: 26.4 10*3/uL — ABNORMAL HIGH (ref 4.0–10.5)

## 2017-02-16 LAB — URINE CULTURE: Culture: NO GROWTH

## 2017-02-16 LAB — HEPARIN LEVEL (UNFRACTIONATED): Heparin Unfractionated: 0.22 IU/mL — ABNORMAL LOW (ref 0.30–0.70)

## 2017-02-16 LAB — LACTIC ACID, PLASMA: Lactic Acid, Venous: 1.9 mmol/L (ref 0.5–1.9)

## 2017-02-16 LAB — GLUCOSE, CAPILLARY
GLUCOSE-CAPILLARY: 124 mg/dL — AB (ref 65–99)
Glucose-Capillary: 99 mg/dL (ref 65–99)
Glucose-Capillary: 141 mg/dL — ABNORMAL HIGH (ref 65–99)
Glucose-Capillary: 104 mg/dL — ABNORMAL HIGH (ref 65–99)

## 2017-02-16 LAB — BASIC METABOLIC PANEL
ANION GAP: 7 (ref 5–15)
BUN: 7 mg/dL (ref 6–20)
CHLORIDE: 100 mmol/L — AB (ref 101–111)
CO2: 22 mmol/L (ref 22–32)
Calcium: 8.1 mg/dL — ABNORMAL LOW (ref 8.9–10.3)
Creatinine, Ser: 0.58 mg/dL — ABNORMAL LOW (ref 0.61–1.24)
GFR calc Af Amer: >60 mL/min (ref >60)
GFR calc non Af Amer: >60 mL/min (ref >60)
Glucose, Bld: 125 mg/dL — ABNORMAL HIGH (ref 65–99)
POTASSIUM: 3.1 mmol/L — AB (ref 3.5–5.1)
Sodium: 129 mmol/L — ABNORMAL LOW (ref 135–145)

## 2017-02-16 LAB — APTT: aPTT: 51 seconds — ABNORMAL HIGH (ref 24–36)

## 2017-02-16 MED ORDER — POLYETHYLENE GLYCOL 3350 17 G PO PACK
17.0000 g | PACK | Freq: Every day | ORAL | Status: DC
  Administered 2017-02-16: 17 g via ORAL
  Filled 2017-02-16: qty 1

## 2017-02-16 MED ORDER — POTASSIUM CHLORIDE CRYS ER 20 MEQ PO TBCR
40.0000 meq | EXTENDED_RELEASE_TABLET | Freq: Once | ORAL | Status: AC
  Administered 2017-02-16: 40 meq via ORAL
  Filled 2017-02-16: qty 2

## 2017-02-16 MED ORDER — POTASSIUM CHLORIDE CRYS ER 20 MEQ PO TBCR
40.0000 meq | EXTENDED_RELEASE_TABLET | ORAL | Status: AC
  Administered 2017-02-16 (×2): 40 meq via ORAL
  Filled 2017-02-16 (×2): qty 2

## 2017-02-16 MED ORDER — FUROSEMIDE 10 MG/ML IJ SOLN
40.0000 mg | Freq: Once | INTRAMUSCULAR | Status: AC
  Administered 2017-02-16: 40 mg via INTRAVENOUS
  Filled 2017-02-16: qty 4

## 2017-02-16 MED ORDER — HEPARIN (PORCINE) IN NACL 100-0.45 UNIT/ML-% IJ SOLN
1300.0000 [IU]/h | INTRAMUSCULAR | Status: DC
  Administered 2017-02-16: 850 [IU]/h via INTRAVENOUS
  Administered 2017-02-17: 1300 [IU]/h via INTRAVENOUS
  Filled 2017-02-16 (×2): qty 250

## 2017-02-16 NOTE — Progress Notes (Signed)
ANTICOAGULATION CONSULT NOTE - Initial Consult  Pharmacy Consult for Heparin Indication: pulmonary embolus  Allergies  Allergen Reactions  . Lisinopril Other (See Comments)    coughing    Patient Measurements: Height: 5\' 6"  (167.6 cm) Weight: 159 lb 12.8 oz (72.5 kg) IBW/kg (Calculated) : 63.8 Heparin Dosing Weight: 72.5 kg  Vital Signs: Temp: 98.4 F (36.9 C) (03/11 1544) Temp Source: Oral (03/11 1544) BP: 118/82 (03/11 1544) Pulse Rate: 111 (03/11 1544)  Labs:  Recent Labs  02/13/17 2306 02/14/17 0342  02/15/17 0141 02/15/17 0533 02/15/17 1610 02/16/17 0523 02/16/17 1922  HGB  --   --   < > 10.0*  --  9.0* 8.3*  --   HCT  --   --   --  29.9*  --  27.8* 25.8*  --   PLT  --   --   --  332  --  296 308  --   APTT  --   --   --   --  49*  --   --  51*  LABPROT  --   --   --   --  21.7*  --   --   --   INR  --   --   --   --  1.86  --   --   --   HEPARINUNFRC 0.36 0.22*  --   --   --   --   --  0.22*  CREATININE  --   --   --  0.72 0.67  --  0.58*  --   < > = values in this interval not displayed.  Estimated Creatinine Clearance: 90.8 mL/min (by C-G formula based on SCr of 0.58 mg/dL (L)).   Medical History: Past Medical History:  Diagnosis Date  . Cancer ([place])   . Chronic hepatitis B virus infection ([place]) 02/15/2004   Annotation: 02/15/2004:  +Core Ab, +SAg, -SAb, -Be Ag with elevated liver  enzymes HBV DNA via PCR:  <2.3 AFP normal Evaluated by [facility] 07/24/2004--Dr. Patsy Baltimore Qualifier: Diagnosis of  By: Amil Amen MD, Benjamine Mola    . Diabetes mellitus without complication (HCC)    no meds  . High cholesterol 2006  . Hyperlipidemia   . Hypertension   . Prediabetes 08/31/2016   Lab Results Component Value Date  HGBA1C 6.2 (H) 08/30/2015     Assessment:   56 yr old male with chronic hepatitis B and hepatocellular carcinoma with liver mets. New PE per CTA on 02/12/17. Was at [facility] 3/7-02/14/17.  IV heparin begun on 3/7 pm, then transitioned to  Eliquis on 3/9 am and discharged.  To ED early 3/10 am with significant nosebleed. Cauterized on 3/10 per ENT. Patient reports no further bleeding, except for small amount this morning, which he says was not significant. Using Saline nasal spray prn.  LFTs normal.      To begin IV heparin conservatively today.  Last Eliquis dose reported 3/9 pm at home.  Recent Eliquis doses will effect heparin levels, so will use aPTTs to monitor heparin dosing.  Discussed with patient and wife. He will report any bleeding.  Heparin level and aPTT correlate and are both subtherapeutic on heparin 850 units/hr. Will use heparin levels moving forward. Nurse reports no issues with infusion or bleeding.  Goal of Therapy:  Heparin level 0.3-0.5 units/ml Monitor platelets by anticoagulation protocol: Yes   Plan:  Increase heparin to 950 units/hr 6h HL Daily HL/CBC Monitor s/sx of bleeding   Georgina Snell  Drucilla Chalet, PharmD, BCPS Clinical Pharmacist [phone] 02/16/2017,8:12 PM

## 2017-02-16 NOTE — Progress Notes (Addendum)
ANTICOAGULATION CONSULT NOTE - Initial Consult  Pharmacy Consult for Heparin Indication: pulmonary embolus  Allergies  Allergen Reactions  . Lisinopril Other (See Comments)    coughing    Patient Measurements: Height: 5\' 6"  (167.6 cm) Weight: 159 lb 12.8 oz (72.5 kg) IBW/kg (Calculated) : 63.8 Heparin Dosing Weight: 72.5 kg  Vital Signs: Temp: 98.7 F (37.1 C) (03/11 1131) Temp Source: Oral (03/11 1131) Pulse Rate: 108 (03/11 1131)  Labs:  Recent Labs  02/13/17 1441 02/13/17 2306 02/14/17 0342  02/15/17 0141 02/15/17 0533 02/15/17 1610 02/16/17 0523  HGB  --   --   --   < > 10.0*  --  9.0* 8.3*  HCT  --   --   --   --  29.9*  --  27.8* 25.8*  PLT  --   --   --   --  332  --  296 308  APTT  --   --   --   --   --  49*  --   --   LABPROT  --   --   --   --   --  21.7*  --   --   INR  --   --   --   --   --  1.86  --   --   HEPARINUNFRC 0.20* 0.36 0.22*  --   --   --   --   --   CREATININE  --   --   --   --  0.72 0.67  --  0.58*  < > = values in this interval not displayed.  Estimated Creatinine Clearance: 90.8 mL/min (by C-G formula based on SCr of 0.58 mg/dL (L)).   Medical History: Past Medical History:  Diagnosis Date  . Cancer ([place])   . Chronic hepatitis B virus infection ([place]) 02/15/2004   Annotation: 02/15/2004:  +Core Ab, +SAg, -SAb, -Be Ag with elevated liver  enzymes HBV DNA via PCR:  <2.3 AFP normal Evaluated by [facility] 07/24/2004--Dr. Patsy Baltimore Qualifier: Diagnosis of  By: Amil Amen MD, Benjamine Mola    . Diabetes mellitus without complication (HCC)    no meds  . High cholesterol 2006  . Hyperlipidemia   . Hypertension   . Prediabetes 08/31/2016   Lab Results Component Value Date  HGBA1C 6.2 (H) 08/30/2015     Assessment:   56 yr old male with chronic hepatitis B and hepatocellular carcinoma with liver mets. New PE per CTA on 02/12/17. Was at [facility] 3/7-02/14/17.  IV heparin begun on 3/7 pm, then transitioned to Eliquis on 3/9 am and  discharged.  To ED early 3/10 am with significant nosebleed. Cauterized on 3/10 per ENT. Patient reports no further bleeding, except for small amount this morning, which he says was not significant. Using Saline nasal spray prn.  LFTs normal.      To begin IV heparin conservatively today.  Last Eliquis dose reported 3/9 pm at home.  Recent Eliquis doses will effect heparin levels, so will use aPTTs to monitor heparin dosing.  Discussed with patient and wife. He will report any bleeding.  Goal of Therapy:  Heparin level 0.3-0.5 units/ml (lower end of usual target range 0.3-0.7) APTT 66-84 seconds (lower end of usual target range of 66-102 seconds) Monitor platelets by anticoagulation protocol: Yes   Plan:   Heparin drip to begin at 850 units/hr (~12 units/kg/hr).  Heparin level and aPTT ~6 hrs after drip begins.  Daily heparin level, aPTT and CBC  while on heparin.  Target low therapeutic heparin levels and aPTTs.  Monitor for any bleeding.  Arty Baumgartner, [place] Pager: [phone] 02/16/2017,1:20 PM

## 2017-02-16 NOTE — Progress Notes (Signed)
Updated report given in patient's room via Marya Amsler RN using SBAR format, updated on VS and events of the day, will assume care of patient.

## 2017-02-16 NOTE — Plan of Care (Signed)
Problem: Nutrition: Goal: Adequate nutrition will be maintained Outcome: Progressing Patient will drink small amounts of Vanilla Ensure with a lot of coaxing by his wife. He doesn't like some of the food and I encouraged his wife to bring in special foods from home as per his Cardiac diet, will continue to monitor.

## 2017-02-16 NOTE — Progress Notes (Signed)
PROGRESS NOTE    Justin [name]  ONG:[id number] DOB: 1961/03/12 DOA: 02/15/2017 PCP: Delman Cheadle, MD    Brief Narrative:  Justin [name] is a 56 y.o. male with medical history significant of prediabetes, HCV, metastasized to liver cancer on chemotherapy, newly diagnosed PE on Eliquis, who presented with nose bleeding.  Patient was hospitalized from 03/6-3/9 due to newly diagnosed pulmonary embolism. He was started Eliquis at discharge. Pt stated that he took two dose of Eliquis yesterday. He started having nose bleeding at about 10:00. He is having postnasal drainage mostly blood and coughing blood. Pt had generalized weakness, decreased oral intake, nausea, but no vomiting or diarrhea. He still has diffused mild abdominal pain. He still has mild cough and a shortness of breath. Patient denied chest pain, fever, chills, unilateral weakness, symptoms of UTI.  ED Course: pt was found to have slight hemoglobin dropped from 10.5 on 02/13/17-->10.0, WBC 29, lactate 2.50, sodium 126, creatinine normal, negative urinalysis, lipase 95, temperature 99.8, tachycardia, tachypnea, blood pressure 117/80. Patient is admitted to stepdown as inpatient. PCCM, Dr. Tamala Julian was consulted. ENT will be consulted by EDP.   Assessment & Plan:   Principal Problem:   PE (pulmonary thromboembolism) ([place]) Active Problems:   Bleeding from the nose   Sepsis [facility]): Probable   Essential hypertension   Hepatocellular carcinoma (HCC)   Hyponatremia   Leukocytosis   Protein-calorie malnutrition, severe  #1 acute bilateral PE Likely secondary to hypercoagulable state of hepatocellular carcinoma. Anticoagulation was held as patient had presented with nasal bleed after being discharged on NOAC. Patient status post nasal endoscopy with cautery of anterior epistaxis per Dr. Redmond Baseman 02/15/2017. Patient with no further bleeding. Will place on IV heparin for the next 24-48 hours and monitor closely for bleeding and if no further bleeding  will place back on NOAC.  #2 anterior epistaxis/nasal bleed Anticoagulation was held. Patient has been assessed by ENT and patient is status post nasal endoscopy with cautery of anterior epistaxis. No further bleeding. Patient to be started on IV heparin today and will need to monitor closely for bleeding. Patient need outpatient follow-up with Dr. Redmond Baseman in about 2 weeks.  #3 ?? Sepsis Patient on admission was noted to have a worsening leukocytosis, tachycardic,low-grade temperature. Patient has been pancultured results pending. Symptoms likely secondary to acute bilateral PE.will discontinue IV vancomycin. Continue IV Zosyn and possibly transition to oral antibiotics to complete a course of treatment.  #4 hyponatremia Likely secondary to volume depletion. Follow.  #5 severe protein calorie malnutrition Patient did show supplementation.  #6 hypertension BP meds on hold. Follow.  #7 prediabetes Hemoglobin A1c 7.1. Not on any medications. Lifestyle modification.  #8 tachycardia Likely secondary to problem #1. TSH obtained during last as well as a she was within normal limits at 3.011. 2-D echo obtained with no wall motion abnormalities and normal EF. Patient is not severely anemic. Follow with initiation of anticoagulation.  #9 gastroesophageal reflux disease Pepcid.  #10 metastatic hepatocellular carcinoma Dr. Benay Spice will be informed via Epic of patient's readmission. Patient had a dose of chemotherapy on 01/31/2017. CT angiogram of the chest showing progression of disease. Outpatient follow-up.    DVT prophylaxis: IV heparin to be resumed today. Code Status: Full Family Communication: Updated patient and family at bedside. Disposition Plan: Home when afebrile, cultures are finalized, place on anticoagulation with no further nasal bleeding and improvement with tachycardia.   Consultants:   ENT: Dr. Melida Quitter 02/15/2017  PCCM curbside Dr Halford Chessman  Procedures:   Chest  x-ray  02/15/2017  Nasal endoscopy with cautery of anterior epistaxis per Dr. Redmond Baseman 02/15/2017  Antimicrobials:   IV vancomycin 02/15/2017>>>>> 02/16/2017  IV Zosyn 02/15/2017   Subjective: Patient with complaints of some chest pain and some shortness of breath. No nausea or emesis. Abdominal pain improved on Pepcid. No further nasal bleeding.  Objective: Vitals:   02/16/17 0434 02/16/17 0600 02/16/17 0802 02/16/17 1131  BP:      Pulse:   (!) 120 (!) 108  Resp:   16 15  Temp: 100.2 F (37.9 C) 99.3 F (37.4 C) 98.9 F (37.2 C) 98.7 F (37.1 C)  TempSrc: Oral Oral Oral Oral  SpO2:   98% 99%  Weight: 72.5 kg (159 lb 12.8 oz)     Height:        Intake/Output Summary (Last 24 hours) at 02/16/17 1227 Last data filed at 02/16/17 0603  Gross per 24 hour  Intake          3306.67 ml  Output                0 ml  Net          3306.67 ml   Filed Weights   02/14/17 2346 02/15/17 0650 02/16/17 0434  Weight: 68 kg (150 lb) 72.4 kg (159 lb 9.6 oz) 72.5 kg (159 lb 12.8 oz)    Examination:  General exam: Appears calm and comfortable  Respiratory system: Clear to auscultation anterior lung fields. Respiratory effort normal. Cardiovascular system: S1 & S2 heard, RRR. No JVD, murmurs, rubs, gallops or clicks. No pedal edema. Gastrointestinal system: Abdomen is nondistended, soft and nontender. No organomegaly or masses felt. Normal bowel sounds heard. Central nervous system: Alert and oriented. No focal neurological deficits. Extremities: Symmetric 5 x 5 power. Skin: No rashes, lesions or ulcers Psychiatry: Judgement and insight appear normal. Mood & affect appropriate.     Data Reviewed: I have personally reviewed following labs and imaging studies  CBC:  Recent Labs Lab 02/12/17 0031 02/12/17 0959 02/13/17 0450 02/15/17 0141 02/15/17 1610 02/16/17 0523  WBC 15.3* 16.0* 18.4* 29.0* 26.5* 26.4*  NEUTROABS 11.4*  --   --  23.2* 22.0* 21.7*  HGB 10.5* 10.6* 10.5* 10.0* 9.0*  8.3*  HCT 32.0* 32.0* 32.7* 29.9* 27.8* 25.8*  MCV 76.7* 77.1* 77.9* 76.5* 77.7* 77.5*  PLT 244 262 293 332 296 174   Basic Metabolic Panel:  Recent Labs Lab 02/12/17 0959 02/13/17 0450 02/15/17 0141 02/15/17 0533 02/16/17 0523  NA 136 137 126* 129* 129*  K 3.4* 3.6 3.6 3.8 3.1*  CL 103 104 94* 100* 100*  CO2 27 25 21* 20* 22  GLUCOSE 128* 104* 133* 112* 125*  BUN 9 11 12 10 7   CREATININE 0.71 0.69 0.72 0.67 0.58*  CALCIUM 9.0 9.1 8.8* 8.2* 8.1*  MG 1.9  --   --   --   --    GFR: Estimated Creatinine Clearance: 90.8 mL/min (by C-G formula based on SCr of 0.58 mg/dL (L)). Liver Function Tests:  Recent Labs Lab 02/12/17 0031 02/15/17 0141  AST 34 48*  ALT 27 28  ALKPHOS 133* 139*  BILITOT 0.6 1.0  PROT 6.7 6.7  ALBUMIN 2.6* 2.5*    Recent Labs Lab 02/15/17 0141  LIPASE 95*   No results for input(s): AMMONIA in the last 168 hours. Coagulation Profile:  Recent Labs Lab 02/12/17 0031 02/15/17 0533  INR 1.24 1.86   Cardiac Enzymes:  Recent Labs Lab 02/12/17 0031 02/12/17 0959 02/12/17  1318 02/12/17 2300  TROPONINI 0.05* 0.06* 0.05* 0.08*   BNP (last 3 results) No results for input(s): PROBNP in the last 8760 hours. HbA1C: No results for input(s): HGBA1C in the last 72 hours. CBG:  Recent Labs Lab 02/15/17 0750 02/15/17 1537 02/15/17 2223 02/16/17 0728 02/16/17 1105  GLUCAP 118* 142* 150* 99 141*   Lipid Profile: No results for input(s): CHOL, HDL, LDLCALC, TRIG, CHOLHDL, LDLDIRECT in the last 72 hours. Thyroid Function Tests: No results for input(s): TSH, T4TOTAL, FREET4, T3FREE, THYROIDAB in the last 72 hours. Anemia Panel: No results for input(s): VITAMINB12, FOLATE, FERRITIN, TIBC, IRON, RETICCTPCT in the last 72 hours. Sepsis Labs:  Recent Labs Lab 02/15/17 0305 02/15/17 0327 02/15/17 0533 02/15/17 0538 02/16/17 0523  PROCALCITON  --   --  0.83  --   --   LATICACIDVEN 2.50* 2.8*  --  2.2* 1.9    Recent Results (from the  past 240 hour(s))  MRSA PCR Screening     Status: None   Collection Time: 02/12/17  4:51 AM  Result Value Ref Range Status   MRSA by PCR NEGATIVE NEGATIVE Final    Comment:        The GeneXpert MRSA Assay (FDA approved for NASAL specimens only), is one component of a comprehensive MRSA colonization surveillance program. It is not intended to diagnose MRSA infection nor to guide or monitor treatment for MRSA infections.   Urine culture     Status: None   Collection Time: 02/15/17  1:39 AM  Result Value Ref Range Status   Specimen Description URINE, RANDOM  Final   Special Requests NONE  Final   Culture   Final    NO GROWTH Performed at [facility], [address] [place], [place] [address]    Report Status 02/16/2017 FINAL  Final         Radiology Studies: Dg Chest Port 1 View  Result Date: 02/15/2017 CLINICAL DATA:  Sepsis.  Hemoptysis. EXAM: PORTABLE CHEST 1 VIEW COMPARISON:  02/12/2017 FINDINGS: Right jugular port terminates in the low SVC. Stable elevation of the right hemidiaphragm. No airspace consolidation. No large effusion. Normal pulmonary vascular IMPRESSION: No acute cardiopulmonary findings. Electronically Signed   By: Andreas Newport M.D.   On: 02/15/2017 06:02        Scheduled Meds: . famotidine  20 mg Oral BID  . feeding supplement (ENSURE ENLIVE)  237 mL Oral TID BM  . ferrous sulfate  325 mg Oral BID WC  . piperacillin-tazobactam (ZOSYN)  IV  3.375 g Intravenous Q8H  . potassium chloride  40 mEq Oral Q4H  . senna-docusate  1 tablet Oral BID  . sodium chloride  2 spray Each Nare Q2H while awake  . sodium chloride flush  3 mL Intravenous Q12H  . vancomycin  750 mg Intravenous Q8H   Continuous Infusions:   LOS: 1 day    Time spent: 35 minutes.    Irine Seal, MD [facility] Pager [phone]  If 7PM-7AM, please contact night-coverage [email or web address] Password [facility] 02/16/2017, 12:27 PM

## 2017-02-16 NOTE — Plan of Care (Signed)
Problem: Bowel/Gastric: Goal: Will not experience complications related to bowel motility Outcome: Progressing Patient has had a couple of hard stools which were uncomfortable to pass but he is a very private person and wouldn't let me see them. He is C/O RLQ abdominal pain with palpation and gas cramping, medicated him with Dulcolax tablet and daylight nurse gave him Miralax, will give him a Tums for gas bloating since his last calcium level was 8.1, and explained that to him, will continue to monitor.

## 2017-02-17 DIAGNOSIS — R509 Fever, unspecified: Secondary | ICD-10-CM

## 2017-02-17 DIAGNOSIS — R0600 Dyspnea, unspecified: Secondary | ICD-10-CM

## 2017-02-17 DIAGNOSIS — I10 Essential (primary) hypertension: Secondary | ICD-10-CM

## 2017-02-17 DIAGNOSIS — Z7901 Long term (current) use of anticoagulants: Secondary | ICD-10-CM

## 2017-02-17 DIAGNOSIS — J9690 Respiratory failure, unspecified, unspecified whether with hypoxia or hypercapnia: Secondary | ICD-10-CM

## 2017-02-17 DIAGNOSIS — I2699 Other pulmonary embolism without acute cor pulmonale: Principal | ICD-10-CM

## 2017-02-17 DIAGNOSIS — R0489 Hemorrhage from other sites in respiratory passages: Secondary | ICD-10-CM

## 2017-02-17 DIAGNOSIS — C220 Liver cell carcinoma: Secondary | ICD-10-CM

## 2017-02-17 DIAGNOSIS — D509 Iron deficiency anemia, unspecified: Secondary | ICD-10-CM

## 2017-02-17 DIAGNOSIS — R000 Tachycardia, unspecified: Secondary | ICD-10-CM

## 2017-02-17 LAB — CBC
HCT: 28.3 % — ABNORMAL LOW (ref 39.0–52.0)
HEMOGLOBIN: 9 g/dL — AB (ref 13.0–17.0)
MCH: 24.9 pg — AB (ref 26.0–34.0)
MCHC: 31.8 g/dL (ref 30.0–36.0)
MCV: 78.4 fL (ref 78.0–100.0)
Platelets: 370 10*3/uL (ref 150–400)
RBC: 3.61 MIL/uL — ABNORMAL LOW (ref 4.22–5.81)
RDW: 16.9 % — ABNORMAL HIGH (ref 11.5–15.5)
WBC: 32.3 10*3/uL — ABNORMAL HIGH (ref 4.0–10.5)

## 2017-02-17 LAB — BASIC METABOLIC PANEL
Anion gap: 9 (ref 5–15)
BUN: 6 mg/dL (ref 6–20)
CHLORIDE: 97 mmol/L — AB (ref 101–111)
CO2: 22 mmol/L (ref 22–32)
CREATININE: 0.71 mg/dL (ref 0.61–1.24)
Calcium: 8.5 mg/dL — ABNORMAL LOW (ref 8.9–10.3)
GFR calc non Af Amer: >60 mL/min (ref >60)
GLUCOSE: 135 mg/dL — AB (ref 65–99)
Potassium: 4 mmol/L (ref 3.5–5.1)
Sodium: 128 mmol/L — ABNORMAL LOW (ref 135–145)

## 2017-02-17 LAB — GLUCOSE, CAPILLARY
GLUCOSE-CAPILLARY: 123 mg/dL — AB (ref 65–99)
GLUCOSE-CAPILLARY: 150 mg/dL — AB (ref 65–99)
Glucose-Capillary: 118 mg/dL — ABNORMAL HIGH (ref 65–99)
Glucose-Capillary: 136 mg/dL — ABNORMAL HIGH (ref 65–99)

## 2017-02-17 LAB — HEPARIN LEVEL (UNFRACTIONATED)
HEPARIN UNFRACTIONATED: 0.19 [IU]/mL — AB (ref 0.30–0.70)
Heparin Unfractionated: 0.2 IU/mL — ABNORMAL LOW (ref 0.30–0.70)
Heparin Unfractionated: <0.1 IU/mL — ABNORMAL LOW (ref 0.30–0.70)

## 2017-02-17 MED ORDER — SIMETHICONE 80 MG PO CHEW
160.0000 mg | CHEWABLE_TABLET | Freq: Four times a day (QID) | ORAL | Status: AC
  Administered 2017-02-17 – 2017-02-18 (×8): 160 mg via ORAL
  Filled 2017-02-17 (×8): qty 2

## 2017-02-17 MED ORDER — SORBITOL 70 % SOLN
30.0000 mL | Freq: Once | Status: AC
  Administered 2017-02-17: 30 mL via ORAL
  Filled 2017-02-17: qty 30

## 2017-02-17 MED ORDER — HEPARIN (PORCINE) IN NACL 100-0.45 UNIT/ML-% IJ SOLN
1700.0000 [IU]/h | INTRAMUSCULAR | Status: DC
  Administered 2017-02-17: 1550 [IU]/h via INTRAVENOUS
  Administered 2017-02-18: 1700 [IU]/h via INTRAVENOUS
  Filled 2017-02-17: qty 250

## 2017-02-17 MED ORDER — MORPHINE SULFATE (PF) 2 MG/ML IV SOLN
2.0000 mg | INTRAVENOUS | Status: DC | PRN
  Administered 2017-02-17: 2 mg via INTRAVENOUS
  Filled 2017-02-17: qty 1

## 2017-02-17 MED ORDER — MORPHINE SULFATE 2 MG/ML IJ SOLN: 2.0000 mg | INTRAMUSCULAR | Status: DC | PRN

## 2017-02-17 MED ORDER — POLYETHYLENE GLYCOL 3350 17 G PO PACK
17.0000 g | PACK | Freq: Two times a day (BID) | ORAL | Status: DC
  Administered 2017-02-17 – 2017-02-20 (×7): 17 g via ORAL
  Filled 2017-02-17 (×7): qty 1

## 2017-02-17 MED ORDER — SODIUM CHLORIDE 0.9 % IV BOLUS (SEPSIS)
500.0000 mL | Freq: Once | INTRAVENOUS | Status: AC
  Administered 2017-02-17: 500 mL via INTRAVENOUS

## 2017-02-17 NOTE — Progress Notes (Signed)
ANTICOAGULATION CONSULT NOTE - Follow Up Consult  Pharmacy Consult for Heparin Indication: pulmonary embolus  Allergies  Allergen Reactions  . Lisinopril Other (See Comments)    coughing    Patient Measurements: Height: 5\' 6"  (167.6 cm) Weight: 160 lb (72.6 kg) IBW/kg (Calculated) : 63.8 Heparin Dosing Weight: 72.4 kg  Vital Signs: Temp: 98.2 F (36.8 C) (03/12 1548) Temp Source: Oral (03/12 1548) BP: 124/88 (03/12 1548) Pulse Rate: 111 (03/12 1548)  Labs:  Recent Labs  02/15/17 0533 02/15/17 1610 02/16/17 0523  02/16/17 1922 02/17/17 0235 02/17/17 1013 02/17/17 1805  HGB  --  9.0* 8.3*  --   --  9.0*  --   --   HCT  --  27.8* 25.8*  --   --  28.3*  --   --   PLT  --  296 308  --   --  370  --   --   APTT 49*  --   --   --  51*  --   --   --   LABPROT 21.7*  --   --   --   --   --   --   --   INR 1.86  --   --   --   --   --   --   --   HEPARINUNFRC  --   --   --   < > 0.22* 0.20* 0.19* <0.10*  CREATININE 0.67  --  0.58*  --   --  0.71  --   --   < > = values in this interval not displayed.  Estimated Creatinine Clearance: 90.8 mL/min (by C-G formula based on SCr of 0.71 mg/dL).   Assessment: Anticoag: Eliquis 10 mg BID pta for recent bilateral PE (3/7), was to chg to 5 mg BID on 3/16. Last Eliquis 3/9 pm pta. Nosebleed on admit that wouldn't stop, AC held. ENT saw 3/10, tx'd with silver nitrate/cautery. Saline spary prn, now without active bleeding. Plan resume AC with IV heparin for now, NO BOLUS, monitor for any rebleeding. APTT 49 sec on 3/10. Didn't order additional baselines 3/11.  No DVT per duplex3/7 - PM HL 0.22>0.2>0.19>now <0.1 no problems with infusion. No bleeding noted by patient.  Goal of Therapy:  Heparin level 0.3-0.5 units/ml Monitor platelets by anticoagulation protocol: Yes   Plan:  Increase IV heparin (no bolus) to 1550 units/hr Next heparin level in 6-8 hrs. Daily HL and CBC  Justin [name], PharmD, BCPS Clinical Staff  Pharmacist Pager [phone]  Justin [name] 02/17/2017,7:58 PM

## 2017-02-17 NOTE — Progress Notes (Signed)
IP PROGRESS NOTE  Subjective:   He was discharged on 02/14/2017 and readmitted on 02/15/2017 with right-sided nose bleeding. The bleeding stopped prior to a ENT evaluation. He underwent silver nitrate treatment of the bilateral nasal septum.  He is now maintained on heparin anticoagulation. He complains of intermittent nausea and dyspnea. He feels weak.   Objective: Vital signs in last 24 hours: Blood pressure (!) 122/97, pulse (!) 127, temperature 98.1 F (36.7 C), temperature source Oral, resp. rate 18, height 5\' 6"  (1.676 m), weight 160 lb (72.6 kg), SpO2 99 %.  Intake/Output from previous day: 03/11 0701 - 03/12 0700 In: 1174.8 [P.O.:460; I.V.:114.8; IV Piggyback:600] Out: -   Physical Exam:  HEENT: No thrush Lungs: Clear bilaterally Cardiac: Regular rate and rhythm, tachycardia Abdomen: No hepatosplenomegaly, no mass, nontender Extremities: No leg edema Skin: Multiple subcutaneous masses included masses at the right forearm, left mid back, right suprapubic area, and right mid abdomen  Portacath/PICC-without erythema  Lab Results:  Recent Labs  02/16/17 0523 02/17/17 0235  WBC 26.4* 32.3*  HGB 8.3* 9.0*  HCT 25.8* 28.3*  PLT 308 370    BMET  Recent Labs  02/16/17 0523 02/17/17 0235  NA 129* 128*  K 3.1* 4.0  CL 100* 97*  CO2 22 22  GLUCOSE 125* 135*  BUN 7 6  CREATININE 0.58* 0.71  CALCIUM 8.1* 8.5*    Studies/Results: No results found.  Medications: I have reviewed the patient's current medications.  Assessment/Plan: 1. Hepatocellular carcinoma ? MRI abdomen 12/12/2016 consistent with a right hepatic HCC measuring 12.3 x 12.7 cm ? Markedly elevated AFP ? History of hepatitis B ? CTs chest, abdomen, and pelvis 12/17/2016-large right liver mass involving segments 7 and 8, possible involvement of the left liver. Nonspecific right lung nodules, nonspecific porta hepatis nodes, 1.5 cm right lower paraesophageal mediastinal node ? EUS biopsy of the  paraesophageal lymph node on 12/26/2016 confirms metastatic hepatocellular carcinoma  ? CT abdomen and pelvis 01/23/2017 with marked progression of the right liver mass, hilar/esophageal lymph nodes, omentum, pancreas head, and subcutaneous nodules ? Cycle 1 gemcitabine/oxaliplatin 01/31/2017  2. Chronic hepatitis B infection  3. Fever  4. Nonproductive cough  5. Anorexia/weight loss  6. Microcytic anemia-likely iron deficiency as the red cell microcytosis is new. Stool Hemoccult cards negative on 01/22/2017  7. Hypertension-amlodipine dose increased to 10 mg 01/16/2017  8. Hypercalcemia of malignancy 01/23/2017-Zometa given 01/23/2017  9.  Bilateral pulmonary embolism 02/12/2017-heparin initiated  Lower extremity Dopplers 02/12/2017-negative for deep vein thrombosis  10. Admission 02/15/2017 with epistaxis, resolved  He was readmitted with epistaxis while maintained on anticoagulation. The epistaxis has resolved. He is now back on heparin.  JustinLoree has persistent tachycardia and dyspnea. The dyspnea is likely related to the chest tumor burden and pulmonary embolism.  The cutaneous masses appear larger. I suspect the systemic tumor burden is progressing.  Justin [name] has advanced metastatic hepatocellular carcinoma with a poor performance status. He is not a candidate for further chemotherapy in his current condition. I discussed comfort care with JustinFeggins and his wife. I will return for further discussion to include CODE STATUS and a possible Hospice referral in the a.m. on 02/18/2017.   Recommendations: 1. Continue heparin anticoagulation 2. Add morphine sulfate for pain and dyspnea 3. Access Port-A-Cath as needed    LOS: 2 days   Betsy Coder, MD   02/17/2017, 1:52 PM

## 2017-02-17 NOTE — Progress Notes (Signed)
[place] for Heparin Indication: pulmonary embolus  Allergies  Allergen Reactions  . Lisinopril Other (See Comments)    coughing    Patient Measurements: Height: 5\' 6"  (167.6 cm) Weight: 160 lb (72.6 kg) IBW/kg (Calculated) : 63.8 Heparin Dosing Weight: 72.5 kg  Vital Signs: Temp: 99.6 F (37.6 C) (03/12 0737) Temp Source: Oral (03/12 0737) BP: 120/89 (03/12 0737) Pulse Rate: 129 (03/12 0737)  Labs:  Recent Labs  02/15/17 0533 02/15/17 1610 02/16/17 0523 02/16/17 1922 02/17/17 0235 02/17/17 1013  HGB  --  9.0* 8.3*  --  9.0*  --   HCT  --  27.8* 25.8*  --  28.3*  --   PLT  --  296 308  --  370  --   APTT 49*  --   --  51*  --   --   LABPROT 21.7*  --   --   --   --   --   INR 1.86  --   --   --   --   --   HEPARINUNFRC  --   --   --  0.22* 0.20* 0.19*  CREATININE 0.67  --  0.58*  --  0.71  --     Estimated Creatinine Clearance: 90.8 mL/min (by C-G formula based on SCr of 0.71 mg/dL).   Medical History: Past Medical History:  Diagnosis Date  . Cancer ([place])   . Chronic hepatitis B virus infection ([place]) 02/15/2004   Annotation: 02/15/2004:  +Core Ab, +SAg, -SAb, -Be Ag with elevated liver  enzymes HBV DNA via PCR:  <2.3 AFP normal Evaluated by [facility] 07/24/2004--Dr. Patsy [place] Qualifier: Diagnosis of  By: Amil Amen MD, Benjamine Mola    . Diabetes mellitus without complication (HCC)    no meds  . High cholesterol 2006  . Hyperlipidemia   . Hypertension   . Prediabetes 08/31/2016   Lab Results Component Value Date  HGBA1C 6.2 (H) 08/30/2015     Assessment: 56 yr old male with chronic hepatitis B and hepatocellular carcinoma with liver mets. New PE per CTA on 02/12/17. Was at [facility] 3/7-02/14/17.  IV heparin begun on 3/7 pm, then transitioned to Eliquis on 3/9 am and discharged. To ED early 3/10 am with significant nosebleed. Cauterized on 3/10 per ENT.  Patient continues on IV heparin today, heparin level is still low  at 0.19 with conservative dosing. Will titrate up rate. Hgb is stable at 9.        Goal of Therapy:  Heparin level 0.3-0.5 units/ml Monitor platelets by anticoagulation protocol: Yes   Plan:  Increase heparin to 1300 units/hr 6h HL Daily HL/CBC Monitor s/sx of bleeding  Erin Hearing PharmD., BCPS Clinical Pharmacist Pager [phone] 02/17/2017 11:28 AM

## 2017-02-17 NOTE — Progress Notes (Signed)
ANTICOAGULATION CONSULT NOTE - Follow Up Consult  Pharmacy Consult for heparin Indication: pulmonary embolus  Labs:  Recent Labs  02/14/17 0342  02/15/17 0533 02/15/17 1610 02/16/17 0523 02/16/17 1922 02/17/17 0235  HGB  --   < >  --  9.0* 8.3*  --  9.0*  HCT  --   < >  --  27.8* 25.8*  --  28.3*  PLT  --   < >  --  296 308  --  370  APTT  --   --  49*  --   --  51*  --   LABPROT  --   --  21.7*  --   --   --   --   INR  --   --  1.86  --   --   --   --   HEPARINUNFRC 0.22*  --   --   --   --  0.22* 0.20*  CREATININE  --   < > 0.67  --  0.58*  --  0.71  < > = values in this interval not displayed.   Assessment: 56yo male now w/ lower heparin level despite rate increase, likely 2/2 Eliquis clearing, no gtt issues per RN.  Goal of Therapy:  Heparin level 0.3-0.5 units/ml   Plan:  Will increase heparin gtt by 2 units/kg/hr to 1100 units/hr and check level in 6hr.  Wynona Neat, PharmD, BCPS  02/17/2017,4:07 AM

## 2017-02-17 NOTE — Progress Notes (Signed)
PROGRESS NOTE    Justin [name]  WUX:[id number] DOB: 1961-09-26 DOA: 02/15/2017 PCP: Delman Cheadle, MD    Brief Narrative:  Justin [name] is a 56 y.o. male with medical history significant of prediabetes, HCV, metastasized to liver cancer on chemotherapy, newly diagnosed PE on Eliquis, who presented with nose bleeding.  Patient was hospitalized from 03/6-3/9 due to newly diagnosed pulmonary embolism. He was started Eliquis at discharge. Pt stated that he took two dose of Eliquis yesterday. He started having nose bleeding at about 10:00. He is having postnasal drainage mostly blood and coughing blood. Pt had generalized weakness, decreased oral intake, nausea, but no vomiting or diarrhea. He still has diffused mild abdominal pain. He still has mild cough and a shortness of breath. Patient denied chest pain, fever, chills, unilateral weakness, symptoms of UTI.  ED Course: pt was found to have slight hemoglobin dropped from 10.5 on 02/13/17-->10.0, WBC 29, lactate 2.50, sodium 126, creatinine normal, negative urinalysis, lipase 95, temperature 99.8, tachycardia, tachypnea, blood pressure 117/80. Patient is admitted to stepdown as inpatient. PCCM, Dr. Tamala Julian was consulted. ENT will be consulted by EDP.   Assessment & Plan:   Principal Problem:   PE (pulmonary thromboembolism) ([place]) Active Problems:   Bleeding from the nose   Sepsis [facility]): Probable   Essential hypertension   Hepatocellular carcinoma (HCC)   Hyponatremia   Leukocytosis   Protein-calorie malnutrition, severe  #1 acute bilateral PE Likely secondary to hypercoagulable state of hepatocellular carcinoma. Anticoagulation was held as patient had presented with nasal bleed after being discharged on NOAC. Patient status post nasal endoscopy with cautery of anterior epistaxis per Dr. Redmond Baseman 02/15/2017. Patient with no further bleeding.  Patient started back on IV heparin 02/16/2017 with no bleeding. Continue IV heparin for another 24 hours and  if no further bleeding will place back on NOAC tomorrow 02/18/2017.  #2 anterior epistaxis/nasal bleed Anticoagulation was held. Patient has been assessed by ENT and patient is status post nasal endoscopy with cautery of anterior epistaxis. No further bleeding. Patient IV heparin on 02/16/2017 and will continue IV heparin for another 24 hours. If no further bleeding or transition back to eliquis tomorrow. Patient will need outpatient follow-up with Dr. Redmond Baseman in about 2 weeks.  #3 ?? Sepsis Patient on admission was noted to have a worsening leukocytosis, tachycardic,low-grade temperature. Patient has been pancultured results pending. Symptoms likely secondary to acute bilateral PE and progressive metastatic hepatocellular carcinoma with increasing tumor burden. IV vancomycin has been discontinued. Will transition from IV Zosyn to oral Augmentin to complete a 5 to seven-day course.  #4 hyponatremia Likely secondary to volume depletion and progressive HCC.. Follow.  #5 severe protein calorie malnutrition Nutritional  supplementation.  #6 hypertension BP meds on hold. Follow.  #7 prediabetes Hemoglobin A1c 7.1. Not on any medications. Lifestyle modification.  #8 tachycardia Likely secondary to problem #1 and #10. TSH obtained during last as well as a she was within normal limits at 3.011. 2-D echo obtained with no wall motion abnormalities and normal EF. Patient is not severely anemic. Follow with initiation of anticoagulation. See problem #10.  #9 gastroesophageal reflux disease Pepcid.  #10 metastatic hepatocellular carcinoma Dr. Benay Spice will be informed via Epic of patient's readmission. Patient had a dose of chemotherapy on 01/31/2017. CT angiogram of the chest showing progression of disease. Patient has been seen by Dr. Benay Spice of oncology and patient noted to have poor performance status, progressive hepatocellular carcinoma patient also with cutaneous masses with likely increasing  tumor burden. Per  oncology patient is not a candidate for further chemotherapy therapy and his current condition and the recommendation probable comfort measures. Oncology to discuss with patient and family again tomorrow for possible hospice referral.     DVT prophylaxis: IV heparin. Code Status: Full Family Communication: Updated patient and family at bedside. Disposition Plan: Home when afebrile, cultures are finalized, place on anticoagulation with no further nasal bleeding and improvement with tachycardia and per hematology/oncology   Consultants:   ENT: Dr. Melida Quitter 02/15/2017  PCCM curbside Dr Halford Chessman  Hematology/Oncology: Dr Benay Spice 02/17/2017    Procedures:   Chest x-ray 02/15/2017  Nasal endoscopy with cautery of anterior epistaxis per Dr. Redmond Baseman 02/15/2017  Antimicrobials:   IV vancomycin 02/15/2017>>>>> 02/16/2017  IV Zosyn 02/15/2017   Subjective: Patient c/o chest pain and shortness of breath. No nausea or emesis. Abdominal pain improved on Pepcid and after having a bowel movement. No further nasal bleeding.  Objective: Vitals:   02/17/17 0010 02/17/17 0555 02/17/17 0737 02/17/17 1100  BP: (!) 116/92 122/87 120/89 (!) 122/97  Pulse: (!) 138 (!) 126 (!) 129 (!) 127  Resp:   18 18  Temp: 99 F (37.2 C)  99.6 F (37.6 C) 98.1 F (36.7 C)  TempSrc: Oral Oral Oral Oral  SpO2: 96% 93% 99% 99%  Weight:  72.6 kg (160 lb)    Height:        Intake/Output Summary (Last 24 hours) at 02/17/17 1329 Last data filed at 02/17/17 1300  Gross per 24 hour  Intake          1334.62 ml  Output                2 ml  Net          1332.62 ml   Filed Weights   02/15/17 0650 02/16/17 0434 02/17/17 0555  Weight: 72.4 kg (159 lb 9.6 oz) 72.5 kg (159 lb 12.8 oz) 72.6 kg (160 lb)    Examination:  General exam: Appears calm and comfortable.  Respiratory system: Clear to auscultation anterior lung fields. Respiratory effort normal. Cardiovascular system: S1 & S2 heard,  RRR. No JVD, murmurs, rubs, gallops or clicks. No pedal edema. Gastrointestinal system: Abdomen is nondistended, soft and nontender. No organomegaly or masses felt. Normal bowel sounds heard. Central nervous system: Alert and oriented. No focal neurological deficits. Extremities: Symmetric 5 x 5 power. Skin: Multiple subcutaneous masses of the right forearm, left mid back, right suprapubic area, right midabdomen.  Psychiatry: Judgement and insight appear normal. Mood & affect appropriate.     Data Reviewed: I have personally reviewed following labs and imaging studies  CBC:  Recent Labs Lab 02/12/17 0031  02/13/17 0450 02/15/17 0141 02/15/17 1610 02/16/17 0523 02/17/17 0235  WBC 15.3*  < > 18.4* 29.0* 26.5* 26.4* 32.3*  NEUTROABS 11.4*  --   --  23.2* 22.0* 21.7*  --   HGB 10.5*  < > 10.5* 10.0* 9.0* 8.3* 9.0*  HCT 32.0*  < > 32.7* 29.9* 27.8* 25.8* 28.3*  MCV 76.7*  < > 77.9* 76.5* 77.7* 77.5* 78.4  PLT 244  < > 293 332 296 308 370  < > = values in this interval not displayed. Basic Metabolic Panel:  Recent Labs Lab 02/12/17 0959 02/13/17 0450 02/15/17 0141 02/15/17 0533 02/16/17 0523 02/17/17 0235  NA 136 137 126* 129* 129* 128*  K 3.4* 3.6 3.6 3.8 3.1* 4.0  CL 103 104 94* 100* 100* 97*  CO2 27 25 21* 20* 22 22  GLUCOSE 128* 104* 133* 112* 125* 135*  BUN 9 11 12 10 7 6   CREATININE 0.71 0.69 0.72 0.67 0.58* 0.71  CALCIUM 9.0 9.1 8.8* 8.2* 8.1* 8.5*  MG 1.9  --   --   --   --   --    GFR: Estimated Creatinine Clearance: 90.8 mL/min (by C-G formula based on SCr of 0.71 mg/dL). Liver Function Tests:  Recent Labs Lab 02/12/17 0031 02/15/17 0141  AST 34 48*  ALT 27 28  ALKPHOS 133* 139*  BILITOT 0.6 1.0  PROT 6.7 6.7  ALBUMIN 2.6* 2.5*    Recent Labs Lab 02/15/17 0141  LIPASE 95*   No results for input(s): AMMONIA in the last 168 hours. Coagulation Profile:  Recent Labs Lab 02/12/17 0031 02/15/17 0533  INR 1.24 1.86   Cardiac  Enzymes:  Recent Labs Lab 02/12/17 0031 02/12/17 0959 02/12/17 1318 02/12/17 2300  TROPONINI 0.05* 0.06* 0.05* 0.08*   BNP (last 3 results) No results for input(s): PROBNP in the last 8760 hours. HbA1C: No results for input(s): HGBA1C in the last 72 hours. CBG:  Recent Labs Lab 02/16/17 1105 02/16/17 1641 02/16/17 2208 02/17/17 0735 02/17/17 1202  GLUCAP 141* 124* 104* 118* 123*   Lipid Profile: No results for input(s): CHOL, HDL, LDLCALC, TRIG, CHOLHDL, LDLDIRECT in the last 72 hours. Thyroid Function Tests: No results for input(s): TSH, T4TOTAL, FREET4, T3FREE, THYROIDAB in the last 72 hours. Anemia Panel: No results for input(s): VITAMINB12, FOLATE, FERRITIN, TIBC, IRON, RETICCTPCT in the last 72 hours. Sepsis Labs:  Recent Labs Lab 02/15/17 0305 02/15/17 0327 02/15/17 0533 02/15/17 0538 02/16/17 0523  PROCALCITON  --   --  0.83  --   --   LATICACIDVEN 2.50* 2.8*  --  2.2* 1.9    Recent Results (from the past 240 hour(s))  MRSA PCR Screening     Status: None   Collection Time: 02/12/17  4:51 AM  Result Value Ref Range Status   MRSA by PCR NEGATIVE NEGATIVE Final    Comment:        The GeneXpert MRSA Assay (FDA approved for NASAL specimens only), is one component of a comprehensive MRSA colonization surveillance program. It is not intended to diagnose MRSA infection nor to guide or monitor treatment for MRSA infections.   Urine culture     Status: None   Collection Time: 02/15/17  1:39 AM  Result Value Ref Range Status   Specimen Description URINE, RANDOM  Final   Special Requests NONE  Final   Culture   Final    NO GROWTH Performed at [facility], [address] [place], [place] [address]    Report Status 02/16/2017 FINAL  Final  Culture, blood (x 2)     Status: None (Preliminary result)   Collection Time: 02/15/17  5:38 AM  Result Value Ref Range Status   Specimen Description BLOOD RIGHT ANTECUBITAL  Final   Special Requests IN  PEDIATRIC BOTTLE 4CC  Final   Culture   Final    NO GROWTH 1 DAY Performed at [facility], [address] [place], [place] [address]    Report Status PENDING  Incomplete  Culture, blood (x 2)     Status: None (Preliminary result)   Collection Time: 02/15/17  5:40 AM  Result Value Ref Range Status   Specimen Description BLOOD LEFT ANTECUBITAL  Final   Special Requests BOTTLES DRAWN AEROBIC ONLY 5CC  Final   Culture   Final  NO GROWTH 1 DAY Performed at [facility], [place] [address], [place], [place] [address]    Report Status PENDING  Incomplete         Radiology Studies: No results found.      Scheduled Meds: . famotidine  20 mg Oral BID  . feeding supplement (ENSURE ENLIVE)  237 mL Oral TID BM  . ferrous sulfate  325 mg Oral BID WC  . piperacillin-tazobactam (ZOSYN)  IV  3.375 g Intravenous Q8H  . polyethylene glycol  17 g Oral BID  . senna-docusate  1 tablet Oral BID  . simethicone  160 mg Oral QID  . sodium chloride  2 spray Each Nare Q2H while awake  . sodium chloride flush  3 mL Intravenous Q12H   Continuous Infusions: . heparin 1,300 Units/hr (02/17/17 1304)     LOS: 2 days    Time spent: 35 minutes.    Irine Seal, MD [facility] Pager [phone]  If 7PM-7AM, please contact night-coverage [email or web address] Password [facility] 02/17/2017, 1:29 PM

## 2017-02-17 NOTE — Progress Notes (Signed)
Text paged Justin [name] regarding HR that continues to climb 130-140's, abnormal lab results, awaiting orders, patient also C/O increasing bloating and fullness in abdomen especially on right side with tenderness with palaption, will continue to monitor. Patient was given a Dulcolax tablet last night with no results at this time.

## 2017-02-18 ENCOUNTER — Telehealth: Payer: Self-pay | Admitting: Family Medicine

## 2017-02-18 ENCOUNTER — Ambulatory Visit: Payer: 59

## 2017-02-18 ENCOUNTER — Other Ambulatory Visit: Payer: 59

## 2017-02-18 ENCOUNTER — Ambulatory Visit: Payer: 59 | Admitting: Nurse Practitioner

## 2017-02-18 DIAGNOSIS — Z515 Encounter for palliative care: Secondary | ICD-10-CM

## 2017-02-18 DIAGNOSIS — Z7189 Other specified counseling: Secondary | ICD-10-CM

## 2017-02-18 LAB — BASIC METABOLIC PANEL
ANION GAP: 11 (ref 5–15)
Anion gap: 10 (ref 5–15)
BUN: 7 mg/dL (ref 6–20)
BUN: 6 mg/dL (ref 6–20)
CO2: 22 mmol/L (ref 22–32)
CO2: 21 mmol/L — AB (ref 22–32)
CREATININE: 0.57 mg/dL — AB (ref 0.61–1.24)
Calcium: 9.1 mg/dL (ref 8.9–10.3)
Calcium: 9.1 mg/dL (ref 8.9–10.3)
Chloride: 92 mmol/L — ABNORMAL LOW (ref 101–111)
Chloride: 91 mmol/L — ABNORMAL LOW (ref 101–111)
Creatinine, Ser: 0.56 mg/dL — ABNORMAL LOW (ref 0.61–1.24)
GFR calc Af Amer: >60 mL/min (ref >60)
GFR calc Af Amer: >60 mL/min (ref >60)
GFR calc non Af Amer: >60 mL/min (ref >60)
GFR calc non Af Amer: >60 mL/min (ref >60)
GLUCOSE: 129 mg/dL — AB (ref 65–99)
GLUCOSE: 139 mg/dL — AB (ref 65–99)
POTASSIUM: 3.7 mmol/L (ref 3.5–5.1)
Potassium: 3.8 mmol/L (ref 3.5–5.1)
SODIUM: 124 mmol/L — AB (ref 135–145)
Sodium: 123 mmol/L — ABNORMAL LOW (ref 135–145)

## 2017-02-18 LAB — HEPARIN LEVEL (UNFRACTIONATED): HEPARIN UNFRACTIONATED: 0.14 [IU]/mL — AB (ref 0.30–0.70)

## 2017-02-18 LAB — OSMOLALITY: Osmolality: 259 mOsm/kg — ABNORMAL LOW (ref 275–295)

## 2017-02-18 LAB — CBC
HEMATOCRIT: 28 % — AB (ref 39.0–52.0)
Hemoglobin: 8.9 g/dL — ABNORMAL LOW (ref 13.0–17.0)
MCH: 24.7 pg — AB (ref 26.0–34.0)
MCHC: 31.8 g/dL (ref 30.0–36.0)
MCV: 77.8 fL — ABNORMAL LOW (ref 78.0–100.0)
Platelets: 371 10*3/uL (ref 150–400)
RBC: 3.6 MIL/uL — ABNORMAL LOW (ref 4.22–5.81)
RDW: 16.7 % — AB (ref 11.5–15.5)
WBC: 34.3 10*3/uL — ABNORMAL HIGH (ref 4.0–10.5)

## 2017-02-18 LAB — OSMOLALITY, URINE: OSMOLALITY UR: 508 mosm/kg (ref 300–900)

## 2017-02-18 LAB — GLUCOSE, CAPILLARY: GLUCOSE-CAPILLARY: 115 mg/dL — AB (ref 65–99)

## 2017-02-18 LAB — CORTISOL: Cortisol, Plasma: 23.4 ug/dL

## 2017-02-18 LAB — SODIUM, URINE, RANDOM: Sodium, Ur: 18 mmol/L

## 2017-02-18 LAB — CREATININE, URINE, RANDOM: Creatinine, Urine: 90.91 mg/dL

## 2017-02-18 MED ORDER — PROCHLORPERAZINE MALEATE 10 MG PO TABS
10.0000 mg | ORAL_TABLET | Freq: Four times a day (QID) | ORAL | Status: DC | PRN
  Filled 2017-02-18: qty 1

## 2017-02-18 MED ORDER — OXYCODONE HCL 5 MG PO TABS
10.0000 mg | ORAL_TABLET | ORAL | Status: DC | PRN
  Administered 2017-02-18: 10 mg via ORAL
  Filled 2017-02-18: qty 2

## 2017-02-18 MED ORDER — SODIUM CHLORIDE 0.9 % IV SOLN
8.0000 mg | Freq: Four times a day (QID) | INTRAVENOUS | Status: DC | PRN
  Administered 2017-02-18: 8 mg via INTRAVENOUS
  Filled 2017-02-18 (×3): qty 4

## 2017-02-18 MED ORDER — LEVALBUTEROL HCL 0.63 MG/3ML IN NEBU
0.6300 mg | INHALATION_SOLUTION | Freq: Four times a day (QID) | RESPIRATORY_TRACT | Status: DC | PRN
  Administered 2017-02-18 – 2017-02-19 (×2): 0.63 mg via RESPIRATORY_TRACT
  Filled 2017-02-18 (×2): qty 3

## 2017-02-18 MED ORDER — SODIUM CHLORIDE 0.9 % IV SOLN
INTRAVENOUS | Status: DC
Start: 2017-02-18 — End: 2017-02-20
  Administered 2017-02-18 – 2017-02-19 (×3): via INTRAVENOUS

## 2017-02-18 MED ORDER — APIXABAN 5 MG PO TABS
5.0000 mg | ORAL_TABLET | Freq: Two times a day (BID) | ORAL | Status: DC
  Administered 2017-02-18 – 2017-02-20 (×5): 5 mg via ORAL
  Filled 2017-02-18 (×5): qty 1

## 2017-02-18 MED ORDER — PANTOPRAZOLE SODIUM 40 MG PO TBEC
40.0000 mg | DELAYED_RELEASE_TABLET | Freq: Every day | ORAL | Status: DC
  Administered 2017-02-18 – 2017-02-20 (×3): 40 mg via ORAL
  Filled 2017-02-18 (×3): qty 1

## 2017-02-18 MED ORDER — SODIUM CHLORIDE 0.9 % IV BOLUS (SEPSIS)
1000.0000 mL | Freq: Once | INTRAVENOUS | Status: AC
  Administered 2017-02-18: 1000 mL via INTRAVENOUS

## 2017-02-18 MED ORDER — OXYCODONE HCL ER 15 MG PO T12A
15.0000 mg | EXTENDED_RELEASE_TABLET | Freq: Two times a day (BID) | ORAL | Status: DC
  Administered 2017-02-18 – 2017-02-20 (×5): 15 mg via ORAL
  Filled 2017-02-18 (×5): qty 1

## 2017-02-18 NOTE — Progress Notes (Signed)
PROGRESS NOTE    Hillel Card  RCV:[id number] DOB: 11/07/1961 DOA: 02/15/2017 PCP: Delman Cheadle, MD    Brief Narrative:  Justin [name] is a 56 y.o. male with medical history significant of prediabetes, HCV, metastasized to liver cancer on chemotherapy, newly diagnosed PE on Eliquis, who presented with nose bleeding.  Patient was hospitalized from 03/6-3/9 due to newly diagnosed pulmonary embolism. He was started Eliquis at discharge. Pt stated that he took two dose of Eliquis yesterday. He started having nose bleeding at about 10:00. He is having postnasal drainage mostly blood and coughing blood. Pt had generalized weakness, decreased oral intake, nausea, but no vomiting or diarrhea. He still has diffused mild abdominal pain. He still has mild cough and a shortness of breath. Patient denied chest pain, fever, chills, unilateral weakness, symptoms of UTI.  ED Course: pt was found to have slight hemoglobin dropped from 10.5 on 02/13/17-->10.0, WBC 29, lactate 2.50, sodium 126, creatinine normal, negative urinalysis, lipase 95, temperature 99.8, tachycardia, tachypnea, blood pressure 117/80. Patient is admitted to stepdown as inpatient. PCCM, Dr. Tamala Julian was consulted. ENT will be consulted by EDP.   Assessment & Plan:   Principal Problem:   PE (pulmonary thromboembolism) ([place]) Active Problems:   Bleeding from the nose   Sepsis [facility]): Probable   Essential hypertension   Hepatocellular carcinoma (HCC)   Hyponatremia   Leukocytosis   Protein-calorie malnutrition, severe  #1 acute bilateral PE Likely secondary to hypercoagulable state of hepatocellular carcinoma. Anticoagulation was held as patient had presented with nasal bleed after being discharged on NOAC. Patient status post nasal endoscopy with cautery of anterior epistaxis per Dr. Redmond Baseman 02/15/2017. Patient with no further bleeding.  Patient started back on IV heparin 02/16/2017 with no bleeding. Patient has had no bleeding on IV heparin  for 48 hours now and subsequently will transition to oral eliquis 5mg  BID and monitor closely for bleeding.   #2 anterior epistaxis/nasal bleed Anticoagulation was held. Patient has been assessed by ENT and patient is status post nasal endoscopy with cautery of anterior epistaxis. No further bleeding. Patient IV heparin on 02/16/2017 and no further bleeding. Will transition eliquis 5 mg twice daily in light of recent bleed. Patient will need outpatient follow-up with Dr. Redmond Baseman in about 2 weeks.  #3 ?? Sepsis Patient on admission was noted to have a worsening leukocytosis, tachycardic,low-grade temperature. Patient has been pancultured results pending. Symptoms likely secondary to acute bilateral PE and progressive metastatic hepatocellular carcinoma with increasing tumor burden. IV vancomycin has been discontinued. Continue IV Zosyn to complete a five-day course of antibiotic treatment.   #4 hyponatremia Likely secondary to volume depletion and progressive HCC. Patient with no significant improvement with hyponatremia and as such will order urine sodium, urine creatinine, cortisol level, serum osmolality, urine osmolality. Patient's had a TSH done recently which was within normal limits. Place on IV fluids. Follow.  #5 severe protein calorie malnutrition Nutritional  supplementation.  #6 hypertension BP meds on hold. Follow.  #7 prediabetes Hemoglobin A1c 7.1. Not on any medications. Lifestyle modification.  #8 tachycardia Likely secondary to problem #1 and #10. TSH obtained during last as well as a she was within normal limits at 3.011. 2-D echo obtained with no wall motion abnormalities and normal EF. Patient is not severely anemic. Follow with initiation of anticoagulation. See problem #10.  #9 gastroesophageal reflux disease Pepcid.  #10 metastatic hepatocellular carcinoma Dr. Benay Spice has consulted on the patient and currently following. Patient had a dose of chemotherapy on  01/31/2017. CT  angiogram of the chest showing progression of disease. Patient has been seen by Dr. Benay Spice of oncology and patient noted to have poor performance status, progressive hepatocellular carcinoma patient also with cutaneous masses with likely increasing tumor burden. Per oncology patient is not a candidate for further chemotherapy therapy and his current condition and the recommendation probable comfort measures. Oncology to discuss with patient and family today about possible hospice referral. Palliative care consultation and hospice referral has been placed per oncology.    DVT prophylaxis: IV heparin. Code Status: Full Family Communication: Updated patient and family at bedside. Disposition Plan: Home with hospice versus residential hospice, when afebrile, cultures are finalized, place on anticoagulation with no further nasal bleeding and improvement with tachycardia and per hematology/oncology and pending palliative care consultation.   Consultants:   ENT: Dr. Melida Quitter 02/15/2017  PCCM curbside Dr Halford Chessman  Hematology/Oncology: Dr Benay Spice 02/17/2017    Procedures:   Chest x-ray 02/15/2017  Nasal endoscopy with cautery of anterior epistaxis per Dr. Redmond Baseman 02/15/2017  Antimicrobials:   IV vancomycin 02/15/2017>>>>> 02/16/2017  IV Zosyn 02/15/2017   Subjective: Patient c/o chest pain and shortness of breath. No nausea or emesis. Abdominal pain improved on Pepcid and after having a bowel movement. No further nasal bleeding.  Objective: Vitals:   02/17/17 2031 02/17/17 2348 02/18/17 0430 02/18/17 0801  BP: (!) 130/98 126/90 (!) 119/99 (!) 117/91  Pulse: (!) 131 (!) 130 (!) 139   Resp: 20 18 20    Temp: 98.1 F (36.7 C) 98.4 F (36.9 C) 97.8 F (36.6 C) 98.9 F (37.2 C)  TempSrc: Axillary Oral Oral Oral  SpO2: 98% 93% 94% 95%  Weight:   72.5 kg (159 lb 12.8 oz)   Height:        Intake/Output Summary (Last 24 hours) at 02/18/17 1055 Last data filed at  02/18/17 0600  Gross per 24 hour  Intake           984.87 ml  Output                3 ml  Net           981.87 ml   Filed Weights   02/16/17 0434 02/17/17 0555 02/18/17 0430  Weight: 72.5 kg (159 lb 12.8 oz) 72.6 kg (160 lb) 72.5 kg (159 lb 12.8 oz)    Examination:  General exam: Appears calm and comfortable.  Respiratory system: Clear to auscultation anterior lung fields. Respiratory effort normal. Cardiovascular system: S1 & S2 heard, RRR. No JVD, murmurs, rubs, gallops or clicks. No pedal edema. Gastrointestinal system: Abdomen is nondistended, soft and nontender. No organomegaly or masses felt. Normal bowel sounds heard. Central nervous system: Alert and oriented. No focal neurological deficits. Extremities: Symmetric 5 x 5 power. Skin: Multiple subcutaneous masses of the right forearm, left mid back, right suprapubic area, right midabdomen.  Psychiatry: Judgement and insight appear normal. Mood & affect appropriate.     Data Reviewed: I have personally reviewed following labs and imaging studies  CBC:  Recent Labs Lab 02/12/17 0031  02/15/17 0141 02/15/17 1610 02/16/17 0523 02/17/17 0235 02/18/17 0257  WBC 15.3*  < > 29.0* 26.5* 26.4* 32.3* 34.3*  NEUTROABS 11.4*  --  23.2* 22.0* 21.7*  --   --   HGB 10.5*  < > 10.0* 9.0* 8.3* 9.0* 8.9*  HCT 32.0*  < > 29.9* 27.8* 25.8* 28.3* 28.0*  MCV 76.7*  < > 76.5* 77.7* 77.5* 78.4 77.8*  PLT 244  < > 332  296 308 370 371  < > = values in this interval not displayed. Basic Metabolic Panel:  Recent Labs Lab 02/12/17 0959  02/15/17 0141 02/15/17 0533 02/16/17 0523 02/17/17 0235 02/18/17 0257  NA 136  < > 126* 129* 129* 128* 124*  K 3.4*  < > 3.6 3.8 3.1* 4.0 3.7  CL 103  < > 94* 100* 100* 97* 92*  CO2 27  < > 21* 20* 22 22 22   GLUCOSE 128*  < > 133* 112* 125* 135* 129*  BUN 9  < > 12 10 7 6 7   CREATININE 0.71  < > 0.72 0.67 0.58* 0.71 0.57*  CALCIUM 9.0  < > 8.8* 8.2* 8.1* 8.5* 9.1  MG 1.9  --   --   --   --   --    --   < > = values in this interval not displayed. GFR: Estimated Creatinine Clearance: 90.8 mL/min (by C-G formula based on SCr of 0.57 mg/dL (L)). Liver Function Tests:  Recent Labs Lab 02/12/17 0031 02/15/17 0141  AST 34 48*  ALT 27 28  ALKPHOS 133* 139*  BILITOT 0.6 1.0  PROT 6.7 6.7  ALBUMIN 2.6* 2.5*    Recent Labs Lab 02/15/17 0141  LIPASE 95*   No results for input(s): AMMONIA in the last 168 hours. Coagulation Profile:  Recent Labs Lab 02/12/17 0031 02/15/17 0533  INR 1.24 1.86   Cardiac Enzymes:  Recent Labs Lab 02/12/17 0031 02/12/17 0959 02/12/17 1318 02/12/17 2300  TROPONINI 0.05* 0.06* 0.05* 0.08*   BNP (last 3 results) No results for input(s): PROBNP in the last 8760 hours. HbA1C: No results for input(s): HGBA1C in the last 72 hours. CBG:  Recent Labs Lab 02/17/17 0735 02/17/17 1202 02/17/17 1612 02/17/17 2029 02/18/17 0729  GLUCAP 118* 123* 150* 136* 115*   Lipid Profile: No results for input(s): CHOL, HDL, LDLCALC, TRIG, CHOLHDL, LDLDIRECT in the last 72 hours. Thyroid Function Tests: No results for input(s): TSH, T4TOTAL, FREET4, T3FREE, THYROIDAB in the last 72 hours. Anemia Panel: No results for input(s): VITAMINB12, FOLATE, FERRITIN, TIBC, IRON, RETICCTPCT in the last 72 hours. Sepsis Labs:  Recent Labs Lab 02/15/17 0305 02/15/17 0327 02/15/17 0533 02/15/17 0538 02/16/17 0523  PROCALCITON  --   --  0.83  --   --   LATICACIDVEN 2.50* 2.8*  --  2.2* 1.9    Recent Results (from the past 240 hour(s))  MRSA PCR Screening     Status: None   Collection Time: 02/12/17  4:51 AM  Result Value Ref Range Status   MRSA by PCR NEGATIVE NEGATIVE Final    Comment:        The GeneXpert MRSA Assay (FDA approved for NASAL specimens only), is one component of a comprehensive MRSA colonization surveillance program. It is not intended to diagnose MRSA infection nor to guide or monitor treatment for MRSA infections.   Urine  culture     Status: None   Collection Time: 02/15/17  1:39 AM  Result Value Ref Range Status   Specimen Description URINE, RANDOM  Final   Special Requests NONE  Final   Culture   Final    NO GROWTH Performed at [facility], [address], [place],  [address]    Report Status 02/16/2017 FINAL  Final  Culture, blood (x 2)     Status: None (Preliminary result)   Collection Time: 02/15/17  5:38 AM  Result Value Ref Range Status   Specimen Description  BLOOD RIGHT ANTECUBITAL  Final   Special Requests IN PEDIATRIC BOTTLE 4CC  Final   Culture   Final    NO GROWTH 2 DAYS Performed at [facility], [place] [address], [place], [place] [address]    Report Status PENDING  Incomplete  Culture, blood (x 2)     Status: None (Preliminary result)   Collection Time: 02/15/17  5:40 AM  Result Value Ref Range Status   Specimen Description BLOOD LEFT ANTECUBITAL  Final   Special Requests BOTTLES DRAWN AEROBIC ONLY 5CC  Final   Culture   Final    NO GROWTH 2 DAYS Performed at [facility], [address] [place], [place] [address]    Report Status PENDING  Incomplete         Radiology Studies: No results found.      Scheduled Meds: . famotidine  20 mg Oral BID  . feeding supplement (ENSURE ENLIVE)  237 mL Oral TID BM  . ferrous sulfate  325 mg Oral BID WC  . oxyCODONE  15 mg Oral Q12H  . pantoprazole  40 mg Oral Q0600  . piperacillin-tazobactam (ZOSYN)  IV  3.375 g Intravenous Q8H  . polyethylene glycol  17 g Oral BID  . senna-docusate  1 tablet Oral BID  . simethicone  160 mg Oral QID  . sodium chloride  2 spray Each Nare Q2H while awake  . sodium chloride flush  3 mL Intravenous Q12H   Continuous Infusions: . sodium chloride 125 mL/hr at 02/18/17 0830  . heparin 1,700 Units/hr (02/18/17 0618)     LOS: 3 days    Time spent: 35 minutes.    Irine Seal, MD [facility] Pager [phone]  If 7PM-7AM, please contact  night-coverage [email or web address] Password [facility] 02/18/2017, 10:55 AM

## 2017-02-18 NOTE — Progress Notes (Signed)
ANTICOAGULATION CONSULT NOTE - Follow Up Consult  Pharmacy Consult for Heparin>>apixaban Indication: pulmonary embolus  Allergies  Allergen Reactions  . Lisinopril Other (See Comments)    coughing    Patient Measurements: Height: 5\' 6"  (167.6 cm) Weight: 159 lb 12.8 oz (72.5 kg) IBW/kg (Calculated) : 63.8 Heparin Dosing Weight: 72.4 kg  Vital Signs: Temp: 98.9 F (37.2 C) (03/13 0801) Temp Source: Oral (03/13 0801) BP: 117/91 (03/13 0801) Pulse Rate: 139 (03/13 0430)  Labs:  Recent Labs  02/16/17 0523  02/16/17 1922 02/17/17 0235 02/17/17 1013 02/17/17 1805 02/18/17 0257  HGB 8.3*  --   --  9.0*  --   --  8.9*  HCT 25.8*  --   --  28.3*  --   --  28.0*  PLT 308  --   --  370  --   --  371  APTT  --   --  51*  --   --   --   --   HEPARINUNFRC  --   < > 0.22* 0.20* 0.19* <0.10* 0.14*  CREATININE 0.58*  --   --  0.71  --   --  0.57*  < > = values in this interval not displayed.  Estimated Creatinine Clearance: 90.8 mL/min (by C-G formula based on SCr of 0.57 mg/dL (L)).   Assessment: Anticoag: Eliquis 10 mg BID pta for recent bilateral PE (3/7), was to chg to 5 mg BID on 3/16. Last Eliquis 3/9 pm pta. Nosebleed on admit that wouldn't stop, AC held. ENT saw 3/10, tx'd with silver nitrate/cautery. Saline spary prn, now without active bleeding.  Patient was being treated with PE dosing of Apixaban prior to this admission, then with severe nose bleed that is resolved with ENT intervention. Hgb stable 8.9 no further bleeding noted. Discussion had with primary team, given need for multiple blood draws, and stable hgb will switch back to apixaban today. Given recent bleeding and overall prognosis it was decided to dose at 5mg  bid to provide adequate anticoagulation without overt bleeding risk.   Of note he has been receiving anticoagulation since 3/7 with interruption on 3/10.  Goal of Therapy:  Monitor platelets by anticoagulation protocol: Yes   Plan:  Resume apixaban  5mg  bid  Erin Hearing PharmD., BCPS Clinical Pharmacist Pager [phone] 02/18/2017 11:05 AM

## 2017-02-18 NOTE — Progress Notes (Signed)
ANTICOAGULATION CONSULT NOTE - Follow Up Consult  Pharmacy Consult for Heparin Indication: pulmonary embolus  Allergies  Allergen Reactions  . Lisinopril Other (See Comments)    coughing    Patient Measurements: Height: 5\' 6"  (167.6 cm) Weight: 160 lb (72.6 kg) IBW/kg (Calculated) : 63.8 Heparin Dosing Weight: 72.4 kg  Vital Signs: Temp: 98.4 F (36.9 C) (03/12 2348) Temp Source: Oral (03/12 2348) BP: 126/90 (03/12 2348) Pulse Rate: 130 (03/12 2348)  Labs:  Recent Labs  02/15/17 0533  02/16/17 0523  02/16/17 1922 02/17/17 0235 02/17/17 1013 02/17/17 1805 02/18/17 0257  HGB  --   < > 8.3*  --   --  9.0*  --   --  8.9*  HCT  --   < > 25.8*  --   --  28.3*  --   --  28.0*  PLT  --   < > 308  --   --  370  --   --  371  APTT 49*  --   --   --  51*  --   --   --   --   LABPROT 21.7*  --   --   --   --   --   --   --   --   INR 1.86  --   --   --   --   --   --   --   --   HEPARINUNFRC  --   --   --   < > 0.22* 0.20* 0.19* <0.10* 0.14*  CREATININE 0.67  --  0.58*  --   --  0.71  --   --   --   < > = values in this interval not displayed.  Estimated Creatinine Clearance: 90.8 mL/min (by C-G formula based on SCr of 0.71 mg/dL).   Assessment: Anticoag: Eliquis 10 mg BID pta for recent bilateral PE (3/7), was to chg to 5 mg BID on 3/16. Last Eliquis 3/9 pm pta. Nosebleed on admit that wouldn't stop, AC held. ENT saw 3/10, tx'd with silver nitrate/cautery. Saline spary prn, now without active bleeding. Plan resume AC with IV heparin, NO BOLUS, monitor for any rebleeding.  Heparin level remains subtherapeutic (0.14) but trending up on 1550 units/hr. No issues with line or bleeding reported per RN.  Goal of Therapy:  Heparin level 0.3-0.5 units/ml Monitor platelets by anticoagulation protocol: Yes   Plan:  Increase IV heparin (no bolus) to 1700 units/hr Next heparin level in 6 hrs  Sherlon Handing, PharmD, BCPS Clinical pharmacist, pager [phone] 02/18/2017,4:24 AM

## 2017-02-18 NOTE — Evaluation (Signed)
Occupational Therapy Evaluation Patient Details Name: Justin [name] MRN: [id number] DOB: 24-Oct-1961 Today's Date: 02/18/2017    History of Present Illness Justin [name] is a 56 y.o. 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     Clinical Impression   Pt admitted with above. He demonstrates the below listed deficits and will benefit from continued OT to maximize safety and independence with BADLs.  Pt presents to OT with generalized weakness, Lt calf pain, decreased activity tolerance and impaired balance.  He requires min - max A for ADLs depending on fatigue, and wife able to assist.  HR increased to 153 and sats 97% on RA.   Recommend tub transfer bench.       Follow Up Recommendations  No OT follow up;Supervision/Assistance - 24 hour    Equipment Recommendations  Tub/shower bench    Recommendations for Other Services       Precautions / Restrictions Precautions Precautions: Fall Precaution Comments: watch HR      Mobility Bed Mobility Overal bed mobility: Needs Assistance Bed Mobility: Supine to Sit;Sit to Supine Rolling: Supervision Sidelying to sit: Supervision;HOB elevated Supine to sit: Supervision Sit to supine: Supervision   General bed mobility comments: cues for technique with repositioning once supine, increased time and effortful for side to sit with railing assist  Transfers Overall transfer level: Needs assistance Equipment used: Rolling walker (2 wheeled) Transfers: Sit to/from [facility] Sit to Stand: Min guard Stand pivot transfers: Min assist       General transfer comment: assist for balance     Balance Overall balance assessment: Needs assistance Sitting-balance support: Feet supported Sitting balance-Leahy Scale: Good     Standing balance support: Bilateral  upper extremity supported Standing balance-Leahy Scale: Poor Standing balance comment: reliant on UE support                             ADL Overall ADL's : Needs assistance/impaired Eating/Feeding: Independent   Grooming: Wash/dry hands;Wash/dry face;Oral care;Brushing hair;Sitting   Upper Body Bathing: Moderate assistance;Sitting   Lower Body Bathing: Sit to/from stand;Moderate assistance   Upper Body Dressing : Moderate assistance;Sitting;Minimal assistance   Lower Body Dressing: Moderate assistance;Sit to/from stand;Minimal assistance   Toilet Transfer: Minimal assistance;Ambulation;Comfort height toilet;Regular Toilet;Grab bars;RW   Toileting- Clothing Manipulation and Hygiene: Minimal assistance;Sit to/from stand       Functional mobility during ADLs: Minimal assistance;Rolling walker General ADL Comments: Pt fatigues rapidly with activity.  HR to 153 with activity.  02 sat 97%      Vision         Perception     Praxis      Pertinent Vitals/Pain Pain Assessment: Faces Faces Pain Scale: Hurts little more Pain Location: Lt calf.  + Homan's sign     Hand Dominance Right   Extremity/Trunk Assessment Upper Extremity Assessment Upper Extremity Assessment: Generalized weakness   Lower Extremity Assessment Lower Extremity Assessment: Defer to PT evaluation LLE Deficits / Details: strength hip flexion 3+/5, knee extension 4-/5, ankle DF 3-/5; some diminished sensation   Cervical / Trunk Assessment Cervical / Trunk Assessment: Normal   Communication Communication Communication: Prefers language other than English (able to communicate reasonably well with OT )   Cognition Arousal/Alertness: Awake/alert Behavior During Therapy: WFL for tasks assessed/performed Overall Cognitive Status: Within Functional Limits for tasks assessed  General Comments  wife will continue to assist with ADLs    Exercises       Shoulder  Instructions      Home Living Family/patient expects to be discharged to:: Private residence Living Arrangements: Spouse/significant other Available Help at Discharge: Family Type of Home: House Home Access: Stairs to enter Technical brewer of Steps: 2   Home Layout: Two level;Bed/bath upstairs Alternate Level Stairs-Number of Steps: flight   Bathroom Shower/Tub: Teacher, early years/pre: Standard     Home Equipment: None          Prior Functioning/Environment Level of Independence: Needs assistance    ADL's / Homemaking Assistance Needed: Pt reports wife assisted him with ADLs due to fatigue             OT Problem List: Decreased strength;Decreased activity tolerance;Impaired balance (sitting and/or standing);Decreased knowledge of use of DME or AE;Cardiopulmonary status limiting activity;Pain      OT Treatment/Interventions: Self-care/ADL training;DME and/or AE instruction;Therapeutic activities;Patient/family education;Balance training;Energy conservation    OT Goals(Current goals can be found in the care plan section) Acute Rehab OT Goals Patient Stated Goal: To go home OT Goal Formulation: With patient/family Time For Goal Achievement: 02/25/17 Potential to Achieve Goals: Fair ADL Goals Additional ADL Goal #1: Pt/caregiver will be independent with use of AD and DME during ADLs Additional ADL Goal #2: Pt/caregiver will be independent with energy conservation techniques   OT Frequency: Min 2X/week   Barriers to D/C:            Co-evaluation              End of Session Equipment Utilized During Treatment: Rolling walker;Gait belt Nurse Communication: Mobility status;Other (comment) (elevated HR )  Activity Tolerance: Patient tolerated treatment well Patient left: in bed;with call bell/phone within reach;with bed alarm set;with family/visitor present  OT Visit Diagnosis: Muscle weakness (generalized) (M62.81)                ADL  either performed or assessed with clinical judgement  Time: 1523-1540 OT Time Calculation (min): 17 min Charges:  OT General Charges $OT Visit: 1 Procedure OT Evaluation $OT Eval Moderate Complexity: 1 Procedure G-Codes:     Lucille Passy, OTR/L [phone]   [place], Justin [name] 02/18/2017, 4:09 PM

## 2017-02-18 NOTE — Plan of Care (Signed)
Problem: Pain Managment: Goal: General experience of comfort will improve Outcome: Progressing Reviewed with Oncology to get something on board for pain since he was having more discomfort and his HR was elevated, will continue to monitor, explained to patient and his wife about pain scale so that I could more accurately help the pain.

## 2017-02-18 NOTE — Telephone Encounter (Signed)
THIS MESSAGE IS FROM [facility]Y AT HOSPICE FOR DR. SHAW: THE PATIENT IS AT [facility] AND WILL PROBABLY BE DISCHARGED ON Wednesday 02/19/17. HE WOULD LIKE DR. SHAW TO BE HIS ATTENDING PHYSICIAN. AMY WOULD LIKE TO KNOW IF DR. SHAW AGREES DOES SHE WANT HOSPICE TO ACTIVATE THE HOSPICE CARE ORDER? BEST PHONE [phone] (AM[facility] AT [place]) [place]

## 2017-02-18 NOTE — Evaluation (Signed)
Physical Therapy Evaluation Patient Details Name: Justin [name] MRN: [id number] DOB: 1961/07/18 Today's Date: 02/18/2017   History of Present Illness  Justin [name] is a 56 y.o. 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    Clinical Impression  Patient presents with decreased independence with mobility due to deficits listed in PT problem list.  Mainly decreased strength, decreased activity tolerance with limited cardiopulmonary reserve and focal L LE weakness.  Feel he will benefit from skilled PT during acute stay prior to d/c home with Hospice care and family support.     Follow Up Recommendations No PT follow up;Supervision/Assistance - 24 hour    Equipment Recommendations  Hospital bed;Wheelchair cushion (measurements PT);Rolling walker with 5" wheels;Other (comment);3in1 (PT)    Recommendations for Other Services       Precautions / Restrictions Precautions Precautions: Fall Precaution Comments: watch HR      Mobility  Bed Mobility Overal bed mobility: Needs Assistance Bed Mobility: Rolling;Sidelying to Sit;Sit to Supine Rolling: Supervision Sidelying to sit: Supervision;HOB elevated   Sit to supine: Supervision   General bed mobility comments: cues for technique with repositioning once supine, increased time and effortful for side to sit with railing assist  Transfers Overall transfer level: Needs assistance Equipment used: Rolling walker (2 wheeled) Transfers: Sit to/from Stand Sit to Stand: Min guard         General transfer comment: assist for balance, safety  Ambulation/Gait Ambulation/Gait assistance: Min assist Ambulation Distance (Feet): 90 Feet Assistive device: Rolling walker (2 wheeled) Gait Pattern/deviations: Step-through pattern;Decreased stride length;Shuffle;Trunk flexed;Wide base of  support     General Gait Details: cues and assist to turn walker, minguard mostly for balance; HR up to 147, SpO2 on RA 97%  Stairs            Wheelchair Mobility    Modified Rankin (Stroke Patients Only)       Balance Overall balance assessment: Needs assistance   Sitting balance-Leahy Scale: Good     Standing balance support: Bilateral upper extremity supported Standing balance-Leahy Scale: Poor Standing balance comment: heavy UE support for balance                             Pertinent Vitals/Pain Pain Assessment: No/denies pain    Home Living Family/patient expects to be discharged to:: Private residence Living Arrangements: Spouse/significant other Available Help at Discharge: Family Type of Home: House Home Access: Stairs to enter   Technical brewer of Steps: 2 Home Layout: Two level;Bed/bath upstairs Home Equipment: None      Prior Function Level of Independence: Needs assistance      ADL's / Homemaking Assistance Needed: wife assisting with all IADL's and some ADL's        Hand Dominance        Extremity/Trunk Assessment        Lower Extremity Assessment Lower Extremity Assessment: LLE deficits/detail LLE Deficits / Details: strength hip flexion 3+/5, knee extension 4-/5, ankle DF 3-/5; some diminished sensation       Communication   Communication: Prefers language other than English (communicates reasonably in [place])  Cognition Arousal/Alertness: Awake/alert Behavior During Therapy: WFL for tasks assessed/performed Overall Cognitive Status: Within Functional Limits for tasks assessed                      General  Comments General comments (skin integrity, edema, etc.): wife and friend in room and supportive, discussed DME concerns for home    Exercises     Assessment/Plan    PT Assessment Patient needs continued PT services  PT Problem List Decreased activity tolerance;Decreased balance;Decreased  strength;Decreased mobility;Decreased safety awareness;Decreased knowledge of use of DME;Cardiopulmonary status limiting activity       PT Treatment Interventions DME instruction;Gait training;Therapeutic exercise;Patient/family education;Stair training;Balance training;Functional mobility training;Therapeutic activities    PT Goals (Current goals can be found in the Care Plan section)  Acute Rehab PT Goals Patient Stated Goal: To go home PT Goal Formulation: With patient/family Time For Goal Achievement: 02/25/17 Potential to Achieve Goals: Fair    Frequency Min 3X/week   Barriers to discharge Inaccessible home environment bedrooms upstairs    Co-evaluation               End of Session Equipment Utilized During Treatment: Gait belt Activity Tolerance: Patient limited by fatigue;Treatment limited secondary to medical complications (Comment) (elevated HR (132 at rest, 147 with ambulation)) Patient left: in bed;with call bell/phone within reach;with family/visitor present   PT Visit Diagnosis: Muscle weakness (generalized) (M62.81);Difficulty in walking, not elsewhere classified (R26.2)         Time: 3967-2897 PT Time Calculation (min) (ACUTE ONLY): 29 min   Charges:   PT Evaluation $PT Eval Moderate Complexity: 1 Procedure PT Treatments $Gait Training: 8-22 mins   PT G Codes:         Reginia Naas 2017-03-02, 12:35 PM  Magda Kiel, [place] 03/02/17

## 2017-02-18 NOTE — Progress Notes (Signed)
Updated report received via Otila Kluver RN in patient's room using SBAR format, reviewed new orders, VS, tests and events of the day, assumed care of patient.

## 2017-02-18 NOTE — Progress Notes (Signed)
[facility] Liaison: RN visit  Notified by Jacqlyn Krauss, CMRN, of patient/family request for Hospice and [place] services at home after discharge.  Chart and patient information reviewed with Dr. Brigid Re, [facility] Director.  Hospice eligibility pending.  Writer spoke with patient and his wife, Sheppard Coil, at bedside to initiate education related to hospice philosophy, services and team approach to care.  Family voiced understanding.  Per discussion, plan is for discharge home by personal vehicle.  Please send signed completed DNR form home with patient/family.  Patient will need prescriptions for discharge comfort medications.  DME needs discussed with patient.  Family request the following DME for delivery to the home today:  O2 concentrator, hospital bed with 1/2 rails, OBT, shower chair, wheelchair, rolling walker.  [facility] Chartered certified accountant, Gayland Curry, notified and will contact [facility] to arrange delivery to the home.  The home address has been verified and is correct in the chart.  Maarif Ubaldo Glassing, wife, is the family member to be contacted to arrange time of delivery.  [facility] Referral [facility] aware of the above.  Completed d/c summary will need to be faxed to [facility] at [phone], when final.  Please notify [facility] when patient is ready to leave unit at discharge --call [phone] or [phone]) after 5 pm.   [facility] information and contact numbers have been given to wife during visit.  Above information shared with Hassan Rowan, [facility].    Please call with any questions or hospice related concerns.  Thank you for the referral.  Edyth Gunnels, RN, [facility] Liaison [phone]  All hospital liaison's are now on [place].

## 2017-02-18 NOTE — Consult Note (Signed)
Consultation Note Date: 02/18/2017   Patient Name: Justin [name]  DOB: Oct 01, 1961  MRN: [id number]  Age / Sex: 56 y.o., male  PCP: Shawnee Knapp, MD Referring Physician: Eugenie Filler, MD  Reason for Consultation: Establishing goals of care  HPI/Patient Profile: 56 y.o. male  with past medical history of  HCV, metastatic Christopher, PE on Eliquis admitted on 02/15/2017 with nosebleed.  He has been evaluated by oncology and is not a candidate for further disease modifying therapy.   Clinical Assessment and Goals of Care: I met today with Justin [name] and Justin [name].  Staff brought translator equiptment to room, however patient reports that he understands the situation and does not want interpreter services.   He reports that Dr. Benay Spice has been doing a good job explaining things to him and he reports Justin understanding is "No more chemotherapy.  I am waiting to get hospice.  I am going to die, and I want to go home until God is ready for me."   We discussed clinical course as well as wishes moving forward in regard to advanced directives at length.  We discussed difference between a aggressive medical intervention path and a palliative, comfort focused care path.  Values and goals of care important to patient and family were attempted to be elicited. I reviewed Justin wishes with him multiple times, and he was consistent in desire to avoid hospital and die a natural death when the time comes.  Concept of Hospice and Palliative Care were discussed.  He again affirmed desire for natural death, avoiding aggressive interventions, and transitioning home with hospice support.  Questions and concerns addressed.   PMT will continue to support holistically.   SUMMARY OF RECOMMENDATIONS   - We discussed that in light of Justin condition and goals, care should be focused on interventions that are likely to allow the patient to achieve  goal of getting back to home and spending time with family. I discussed with family regarding heroic interventions at the end-of-life and patient agrees this would not be in line with prior expressed wishes for a natural death or be likely to lead to getting well enough to go back home. They were in agreement with changing CODE STATUS to DO NOT RESUSCITATE. - Plan to transition home with hospice.  Referral has been placed.  Code Status/Advance Care Planning:  DNR  Additional Recommendations (Limitations, Scope, Preferences):  Avoid Hospitalization  Psycho-social/Spiritual:   Desire for further Chaplaincy support:no  Additional Recommendations: Education on Hospice  Prognosis:   < 6 months  Discharge Planning: Home with Hospice      Primary Diagnoses: Present on Admission: . Protein-calorie malnutrition, severe . Leukocytosis . PE (pulmonary thromboembolism) ([place]) . Hepatocellular carcinoma ([place]) . Essential hypertension . Hyponatremia . Bleeding from the nose . Sepsis ([place]): Probable   I have reviewed the medical record, interviewed the patient and family, and examined the patient. The following aspects are pertinent.  Past Medical History:  Diagnosis Date  . Cancer ([place])   . Chronic  hepatitis B virus infection ([place]) 02/15/2004   Annotation: 02/15/2004:  +Core Ab, +SAg, -SAb, -Be Ag with elevated liver  enzymes HBV DNA via PCR:  <2.3 AFP normal Evaluated by [facility] 07/24/2004--Dr. Patsy [place] Qualifier: Diagnosis of  By: Amil Amen MD, Benjamine Mola    . Diabetes mellitus without complication (HCC)    no meds  . High cholesterol 2006  . Hyperlipidemia   . Hypertension   . Prediabetes 08/31/2016   Lab Results Component Value Date  HGBA1C 6.2 (H) 08/30/2015     Social History   Social History  . Marital status: Married    Spouse name: N/A  . Number of children: N/A  . Years of education: N/A   Social History Main Topics  . Smoking status: Never Smoker  .  Smokeless tobacco: Never Used  . Alcohol use No  . Drug use: No  . Sexual activity: No   Other Topics Concern  . None   Social History Narrative   Married, [name] Justin [name]   Employed at [employer] in [place]   Has lived in [place] over 54 years   Family History  Problem Relation Age of Onset  . Heart disease Father   . Hyperlipidemia Father    Scheduled Meds: . apixaban  5 mg Oral BID  . famotidine  20 mg Oral BID  . feeding supplement (ENSURE ENLIVE)  237 mL Oral TID BM  . ferrous sulfate  325 mg Oral BID WC  . oxyCODONE  15 mg Oral Q12H  . pantoprazole  40 mg Oral Q0600  . piperacillin-tazobactam (ZOSYN)  IV  3.375 g Intravenous Q8H  . polyethylene glycol  17 g Oral BID  . senna-docusate  1 tablet Oral BID  . sodium chloride  2 spray Each Nare Q2H while awake  . sodium chloride flush  3 mL Intravenous Q12H   Continuous Infusions: . sodium chloride 125 mL/hr at 02/18/17 1900   PRN Meds:.acetaminophen **OR** acetaminophen, bisacodyl, calcium carbonate, levalbuterol, morphine injection, ondansetron (ZOFRAN) IV, ondansetron, oxyCODONE, prochlorperazine, zolpidem Medications Prior to Admission:  Prior to Admission medications   Medication Sig Start Date End Date Taking? Authorizing Provider  apixaban (ELIQUIS) 5 MG TABS tablet Take 2 tablets (10 mg total) by mouth 2 (two) times daily. 02/14/17  Yes Debbe Odea, MD  bisacodyl (DULCOLAX) 5 MG EC tablet Take 5 mg by mouth daily as needed for moderate constipation.   Yes Historical Provider, MD  calcium carbonate (TUMS - DOSED IN MG ELEMENTAL CALCIUM) 500 MG chewable tablet Chew 1 tablet by mouth daily as needed for indigestion or heartburn.   Yes Historical Provider, MD  famotidine (PEPCID) 20 MG tablet Take 1 tablet (20 mg total) by mouth 2 (two) times daily. 02/14/17  Yes Debbe Odea, MD  feeding supplement (BOOST / RESOURCE BREEZE) LIQD Take 1 Container by mouth daily. 02/14/17  Yes Debbe Odea, MD    feeding supplement, ENSURE ENLIVE, (ENSURE ENLIVE) LIQD Take 237 mLs by mouth 3 (three) times daily between meals. 02/14/17  Yes Debbe Odea, MD  ferrous sulfate 325 (65 FE) MG tablet Take 325 mg by mouth 2 (two) times daily with a meal.   Yes Historical Provider, MD  lidocaine-prilocaine (EMLA) cream Apply to port site one hour prior to use. Do not rub in, cover with plastic. 01/31/17  Yes Ladell Pier, MD  naproxen sodium (ANAPROX) 220 MG tablet Take 220 mg by mouth 2 (two) times daily as needed (for pain/fever/headache.).  Yes Historical Provider, MD  ondansetron (ZOFRAN) 8 MG tablet Take 1 tablet (8 mg total) by mouth every 8 (eight) hours as needed for nausea or vomiting. 01/23/17  Yes Ladene Artist, MD  oxyCODONE-acetaminophen (PERCOCET/ROXICET) 5-325 MG tablet Take 1-2 tablets by mouth every 4 (four) hours as needed for severe pain. 01/23/17  Yes Ladene Artist, MD  potassium chloride SA (K-DUR,KLOR-CON) 20 MEQ tablet Take 1 tablet (20 mEq total) by mouth daily. 01/23/17  Yes Ladene Artist, MD  prochlorperazine (COMPAZINE) 10 MG tablet Take 1 tablet (10 mg total) by mouth every 6 (six) hours as needed for nausea or vomiting. 01/23/17  Yes Ladene Artist, MD  apixaban (ELIQUIS) 5 MG TABS tablet Take 1 tablet (5 mg total) by mouth 2 (two) times daily. 02/21/17   Calvert Cantor, MD   Allergies  Allergen Reactions  . Lisinopril Other (See Comments)    coughing   Review of Systems  Denies complaints today  Physical Exam  General: Alert, awake, in no acute distress.  HEENT: No bruits, no goiter, no JVD Heart: Regular rate and rhythm. No murmur appreciated. Lungs: Good air movement, clear Abdomen: Soft, nontender, nondistended, positive bowel sounds.  Ext: No significant edema Skin: Warm and dry Neuro: Grossly intact, nonfocal.  Vital Signs: BP (!) 133/97 (BP Location: Left Arm)   Pulse (!) 133   Temp 99 F (37.2 C) (Oral)   Resp 20   Ht 5\' 6"  (1.676 m)   Wt 72.5 kg (159 lb  12.8 oz)   SpO2 98%   BMI 25.79 kg/m  Pain Assessment: No/denies pain   Pain Score: 5    SpO2: SpO2: 98 % O2 Device:SpO2: 98 % O2 Flow Rate: .O2 Flow Rate (L/min): 2 L/min  IO: Intake/output summary:  Intake/Output Summary (Last 24 hours) at 02/18/17 2226 Last data filed at 02/18/17 1900  Gross per 24 hour  Intake          2978.75 ml  Output                0 ml  Net          2978.75 ml    LBM: Last BM Date: 02/17/17 Baseline Weight: Weight: 68 kg (150 lb) Most recent weight: Weight: 72.5 kg (159 lb 12.8 oz)     Palliative Assessment/Data:   Flowsheet Rows   Flowsheet Row Most Recent Value  Intake Tab  Referral Department  Oncology  Unit at Time of Referral  Cardiac/Telemetry Unit  Palliative Care Primary Diagnosis  Cancer  Date Notified  02/18/17  Palliative Care Type  New Palliative care  Reason for referral  Clarify Goals of Care  Date of Admission  02/15/17  Date first seen by Palliative Care  02/18/17  # of days IP prior to Palliative referral  3  Clinical Assessment  Palliative Performance Scale Score  30%  Pain Max last 24 hours  8  Pain Min Last 24 hours  0  Psychosocial & Spiritual Assessment  Palliative Care Outcomes  Patient/Family meeting held?  Yes  Who was at the meeting?  patient, [name]  Palliative Care Outcomes  Changed CPR status, Provided end of life care assistance, Clarified goals of care      Time In: 1320 Time Out: 1440 Time Total: 80 Greater than 50%  of this time was spent counseling and coordinating care related to the above assessment and plan.  Signed by: 02/20/17, MD   Please contact Palliative Medicine Team  phone at [phone] for questions and concerns.  For individual provider: See Shea Evans

## 2017-02-18 NOTE — Care Management Note (Addendum)
Case Management Note  Patient Details  Name: Justin [name] MRN: [id number] Date of Birth: [birth year]  Subjective/Objective: Pt presented for nose bleeding. Hx of hepatocellular carcinoma. Pt is from home with spouse. Plan for home with Hospice Services.                 Action/Plan: CM received referral to offer Hospice Choice. Agency List provided to patient and he chose [facility]. CM did make referral and SOC to begin within 24-48 hours of d/c.CM did discuss DME and pt will need oxygen, hospital bed, over bed table, wheelchair and RW. [facility] to assist with DME. No further needs at this time from CM.   Expected Discharge Date:  02/17/17               Expected Discharge Plan:  Home w Hospice Care  In-House Referral:  NA  Discharge planning Services  CM Consult  Post Acute Care Choice:  Hospice Choice offered to:  Patient, Spouse  DME Arranged:  Other see comment ([facility] to order DME ) DME Agency:  [facility]  HH Arranged:  RN [facility] Agency:  [facility]  Status of Service:  Completed, signed off  If discussed at [place] of Stay Meetings, dates discussed:  02-20-17  Additional Comments: 02-19-17 1109 Jacqlyn Krauss, RN,BSN [phone] Oxygen to be delivered to patient's home between 1 pm-5 pm. Neighbor to be at home to receive the 02 and call wife once delivered. [facility] to arrange for portable tank from [facility] to be delivered to room for transport home. Pt will transport home via car. No further needs from CM at this time.  Bethena Roys, RN 02/18/2017, 3:20 PM

## 2017-02-18 NOTE — Progress Notes (Signed)
IP PROGRESS NOTE  Subjective:   He continues to have nausea after eating. He feels "weak ". Zofran helps the nausea. He reports sweats at night.   Objective: Vital signs in last 24 hours: Blood pressure (!) 117/91, pulse (!) 139, temperature 98.9 F (37.2 C), temperature source Oral, resp. rate 20, height 5\' 6"  (1.676 m), weight 159 lb 12.8 oz (72.5 kg), SpO2 95 %.  Intake/Output from previous day: 03/12 0701 - 03/13 0700 In: 1388.9 [P.O.:960; I.V.:278.9; IV Piggyback:150] Out: 3 [Urine:2; Stool:1]  Physical Exam:   Lungs: Clear bilaterally, increased respiratory rate Cardiac: Regular rate and rhythm, tachycardia Abdomen: No hepatosplenomegaly, no mass, nontender Extremities: No leg edema Skin: Multiple subcutaneous masses included masses at the right forearm, left mid back, right suprapubic area, and right mid abdomen  Portacath/PICC-without erythema  Lab Results:  Recent Labs  02/17/17 0235 02/18/17 0257  WBC 32.3* 34.3*  HGB 9.0* 8.9*  HCT 28.3* 28.0*  PLT 370 371    BMET  Recent Labs  02/17/17 0235 02/18/17 0257  NA 128* 124*  K 4.0 3.7  CL 97* 92*  CO2 22 22  GLUCOSE 135* 129*  BUN 6 7  CREATININE 0.71 0.57*  CALCIUM 8.5* 9.1    Studies/Results: No results found.  Medications: I have reviewed the patient's current medications.  Assessment/Plan: 1. Hepatocellular carcinoma ? MRI abdomen 12/12/2016 consistent with a right hepatic HCC measuring 12.3 x 12.7 cm ? Markedly elevated AFP ? History of hepatitis B ? CTs chest, abdomen, and pelvis 12/17/2016-large right liver mass involving segments 7 and 8, possible involvement of the left liver. Nonspecific right lung nodules, nonspecific porta hepatis nodes, 1.5 cm right lower paraesophageal mediastinal node ? EUS biopsy of the paraesophageal lymph node on 12/26/2016 confirms metastatic hepatocellular carcinoma  ? CT abdomen and pelvis 01/23/2017 with marked progression of the right liver mass,  hilar/esophageal lymph nodes, omentum, pancreas head, and subcutaneous nodules ? Cycle 1 gemcitabine/oxaliplatin 01/31/2017  2. Chronic hepatitis B infection  3. Fever  4. Nonproductive cough  5. Anorexia/weight loss  6. Microcytic anemia-likely iron deficiency as the red cell microcytosis is new. Stool Hemoccult cards negative on 01/22/2017  7. Hypertension-amlodipine dose increased to 10 mg 01/16/2017  8. Hypercalcemia of malignancy 01/23/2017-Zometa given 01/23/2017  9.  Bilateral pulmonary embolism 02/12/2017-heparin initiated  Lower extremity Dopplers 02/12/2017-negative for deep vein thrombosis  10. Admission 02/15/2017 with epistaxis, resolved  He was readmitted with epistaxis while maintained on anticoagulation. The epistaxis has resolved. He is now back on heparin.  Mr.Ramer appears unchanged. He continues to have respiratory distress and nausea. I suspect his symptoms are largely related to the metastatic tumor burden. He is not a candidate for further systemic therapy in his current condition.  I discussed the current situation with Mr. Aiello and his wife. I specifically recommended Hospice care. He agrees to a Hospice referral. I discussed CPR and ACLS issues. I am not sure he was able to understand the CODE STATUS discussion. He speaks  [place], but does not appear fluent. He says that his physician friend has returned to [place]. I think he will benefit from a palliative care consult   Recommendations: 1. Continue heparin anticoagulation 2. Continue morphine as needed for pain or dyspnea 3. [facility] hospice referral for home care or [facility] 4. Palliative care consultation    LOS: 3 days   Betsy Coder, MD   02/18/2017, 8:31 AM

## 2017-02-19 ENCOUNTER — Inpatient Hospital Stay (HOSPITAL_COMMUNITY): Payer: 59

## 2017-02-19 DIAGNOSIS — R0602 Shortness of breath: Secondary | ICD-10-CM

## 2017-02-19 LAB — CBC
HCT: 27.2 % — ABNORMAL LOW (ref 39.0–52.0)
Hemoglobin: 8.7 g/dL — ABNORMAL LOW (ref 13.0–17.0)
MCH: 24.9 pg — AB (ref 26.0–34.0)
MCHC: 32 g/dL (ref 30.0–36.0)
MCV: 77.9 fL — AB (ref 78.0–100.0)
PLATELETS: 323 10*3/uL (ref 150–400)
RBC: 3.49 MIL/uL — ABNORMAL LOW (ref 4.22–5.81)
RDW: 17.1 % — AB (ref 11.5–15.5)
WBC: 35.3 10*3/uL — AB (ref 4.0–10.5)

## 2017-02-19 LAB — BASIC METABOLIC PANEL
Anion gap: 11 (ref 5–15)
BUN: 8 mg/dL (ref 6–20)
CALCIUM: 9 mg/dL (ref 8.9–10.3)
CO2: 22 mmol/L (ref 22–32)
CREATININE: 0.63 mg/dL (ref 0.61–1.24)
Chloride: 91 mmol/L — ABNORMAL LOW (ref 101–111)
GFR calc Af Amer: >60 mL/min (ref >60)
GLUCOSE: 114 mg/dL — AB (ref 65–99)
Potassium: 3.9 mmol/L (ref 3.5–5.1)
Sodium: 124 mmol/L — ABNORMAL LOW (ref 135–145)

## 2017-02-19 LAB — GLUCOSE, CAPILLARY: Glucose-Capillary: 137 mg/dL — ABNORMAL HIGH (ref 65–99)

## 2017-02-19 MED ORDER — METHYLPREDNISOLONE SODIUM SUCC 125 MG IJ SOLR
60.0000 mg | Freq: Two times a day (BID) | INTRAMUSCULAR | Status: DC
Start: 2017-02-19 — End: 2017-02-20
  Administered 2017-02-19 – 2017-02-20 (×3): 60 mg via INTRAVENOUS
  Filled 2017-02-19 (×3): qty 2

## 2017-02-19 MED ORDER — IPRATROPIUM-ALBUTEROL 0.5-2.5 (3) MG/3ML IN SOLN: 3.0000 mL | RESPIRATORY_TRACT | Status: DC | PRN

## 2017-02-19 MED ORDER — IPRATROPIUM-ALBUTEROL 0.5-2.5 (3) MG/3ML IN SOLN
3.0000 mL | Freq: Four times a day (QID) | RESPIRATORY_TRACT | Status: DC
  Administered 2017-02-19 (×2): 3 mL via RESPIRATORY_TRACT
  Filled 2017-02-19 (×2): qty 3

## 2017-02-19 MED ORDER — MORPHINE SULFATE (CONCENTRATE) 10 MG/0.5ML PO SOLN
10.0000 mg | ORAL | Status: DC | PRN
  Administered 2017-02-19: 10 mg via ORAL
  Filled 2017-02-19: qty 0.5

## 2017-02-19 MED ORDER — MORPHINE SULFATE (PF) 2 MG/ML IV SOLN: 2.0000 mg | INTRAVENOUS | Status: DC | PRN

## 2017-02-19 NOTE — Progress Notes (Signed)
[facility] Liaison:  Spoke with [facility] this morning and they advised that patient's wife wouldn't set up an appointment with them yesterday.  I advised that we need equipment in the home prior to patient going home.   They advised will call wife again to set up delivery time.    I spoke with Hassan Rowan, [facility], and advised of the same.   She told me that we need patient to also have a portable tank delivered to him room prior to discharge.   I ordered the portable O2 tank with [facility] equipment manager, Gayland Curry, and [facility] should be contacting wife.  Hassan Rowan, [facility], called me back to advise that wife doesn't understand that she needs to contact [facility].  Hassan Rowan is calling [facility] for wife to advise of time of delivery.   Thank you,  Edyth Gunnels, RN, [facility] Liaison [phone]  All hospital liaison's are now on [place].

## 2017-02-19 NOTE — Telephone Encounter (Signed)
Amy advised.  

## 2017-02-19 NOTE — Telephone Encounter (Signed)
Yes, will be attending and ok to activate hospice care orders

## 2017-02-19 NOTE — Progress Notes (Addendum)
IP PROGRESS NOTE  Subjective:   He reports dyspnea. He continues to have abdominal distention and nausea. He met with palliative care yesterday.   Objective: Vital signs in last 24 hours: Blood pressure 135/90, pulse (!) 141, temperature 98.7 F (37.1 C), temperature source Oral, resp. rate (!) 22, height _0  (1.676 m), weight 171 lb 14.4 oz (78 kg), SpO2 96 %.  Intake/Output from previous day: 03/13 0701 - 03/14 0700 In: 4197 [P.O.:600; I.V.:2443; IV Piggyback:1154] Out: -   Physical Exam:   Lungs: Bilateral expiratory wheeze, increased respiratory rate, decreased breath sounds at the right lower chest Cardiac: Regular rate and rhythm, tachycardia, the neck veins are distended Abdomen: No hepatosplenomegaly, no mass, nontender, distended Extremities: No leg edema Skin: Multiple subcutaneous masses included masses at the right forearm, left mid back, right suprapubic area, and right mid abdomen, left neck, and left suprapubic area  Portacath/PICC-without erythema  Lab Results:  Recent Labs  02/18/17 0257 02/19/17 0259  WBC 34.3* 35.3*  HGB 8.9* 8.7*  HCT 28.0* 27.2*  PLT 371 323    BMET  Recent Labs  02/18/17 1430 02/19/17 0259  NA 123* 124*  K 3.8 3.9  CL 91* 91*  CO2 21* 22  GLUCOSE 139* 114*  BUN 6 8  CREATININE 0.56* 0.63  CALCIUM 9.1 9.0    Studies/Results: No results found.  Medications: I have reviewed the patient's current medications.  Assessment/Plan: 1. Hepatocellular carcinoma ? MRI abdomen 12/12/2016 consistent with a right hepatic HCC measuring 12.3 x 12.7 cm ? Markedly elevated AFP ? History of hepatitis B ? CTs chest, abdomen, and pelvis 12/17/2016-large right liver mass involving segments 7 and 8, possible involvement of the left liver. Nonspecific right lung nodules, nonspecific porta hepatis nodes, 1.5 cm right lower paraesophageal mediastinal node ? EUS biopsy of the paraesophageal lymph node on 12/26/2016 confirms metastatic  hepatocellular carcinoma  ? CT abdomen and pelvis 01/23/2017 with marked progression of the right liver mass, hilar/esophageal lymph nodes, omentum, pancreas head, and subcutaneous nodules ? Cycle 1 gemcitabine/oxaliplatin 01/31/2017  2. Chronic hepatitis B infection  3. Fever  4. Nonproductive cough  5. Anorexia/weight loss  6. Microcytic anemia-likely iron deficiency as the red cell microcytosis is new. Stool Hemoccult cards negative on 01/22/2017  7. Hypertension-amlodipine dose increased to 10 mg 01/16/2017  8. Hypercalcemia of malignancy 01/23/2017-Zometa given 01/23/2017  9.  Bilateral pulmonary embolism 02/12/2017-heparin initiated  Lower extremity Dopplers 02/12/2017-negative for deep vein thrombosis  10. Admission 02/15/2017 with epistaxis, resolved   JustinJustin [name] has progressive metastatic hepatocellular carcinoma. The cutaneous masses or enlarging. He appears to be in respiratory distress this morning. I discussed the situation with [name] and his wife. He would like to return home with Hospice care. I estimate his lifespan to be days or weeks. He may be a candidate for [employer]. I discussed the prognosis with his wife in the hall today.   I appreciate the consult from Dr. Domingo Cocking.  Recommendations: 1. Increase morphine sulfate use, trial of Roxanol for dyspnea  2. [facility] hospice referral for home care or [facility] 3. Trial of steroids for the respiratory distress 4. Decrease IV fluids, consider chest x-ray-has neck volume excess over the past days    LOS: 4 days   Betsy Coder, MD   02/19/2017, 8:50 AM

## 2017-02-19 NOTE — Progress Notes (Signed)
Daily Progress Note   Patient Name: Justin [name]       Date: 02/19/2017 DOB: 1961/06/21  Age: 56 y.o. MRN#: [id number] Attending Physician: Rosita Fire, MD Primary Care Physician: Delman Cheadle, MD Admit Date: 02/15/2017  Reason for Consultation/Follow-up: Establishing goals of care  Subjective: I met today with Mr. Nearhood and his wife.  He reports feeling more short of breath.  He appears acutely worse to me compared to yesterday.    I spoke with him about plan moving forward.  I agree with Dr. Benay Spice that he would likely be well served by [facility], and I brought this up to discuss with him today.  He is clear that he is not interested in any discharge other than home (with hospice support).    Length of Stay: 4  Current Medications: Scheduled Meds:  . apixaban  5 mg Oral BID  . famotidine  20 mg Oral BID  . feeding supplement (ENSURE ENLIVE)  237 mL Oral TID BM  . ferrous sulfate  325 mg Oral BID WC  . ipratropium-albuterol  3 mL Nebulization Q6H  . methylPREDNISolone (SOLU-MEDROL) injection  60 mg Intravenous Q12H  . oxyCODONE  15 mg Oral Q12H  . pantoprazole  40 mg Oral Q0600  . piperacillin-tazobactam (ZOSYN)  IV  3.375 g Intravenous Q8H  . polyethylene glycol  17 g Oral BID  . senna-docusate  1 tablet Oral BID  . sodium chloride  2 spray Each Nare Q2H while awake  . sodium chloride flush  3 mL Intravenous Q12H    Continuous Infusions: . sodium chloride 10 mL/hr at 02/19/17 1023    PRN Meds: acetaminophen **OR** acetaminophen, bisacodyl, calcium carbonate, levalbuterol, morphine injection, morphine CONCENTRATE, ondansetron (ZOFRAN) IV, ondansetron, prochlorperazine, zolpidem  Physical Exam      General: Alert, awake, in no moderate respiratory distress.   Heart: Tachycardic. No murmur appreciated. Lungs: Fair air movement, exp wheeze Abdomen: Soft, nontender, nondistended, positive bowel sounds.  Ext: No significant edema Neuro: Grossly intact, nonfocal.     Vital Signs: BP 133/83   Pulse (!) 129   Temp 98.9 F (37.2 C) (Oral)   Resp (!) 22   Ht _0  (1.676 m)   Wt 78 kg (171 lb 14.4 oz)   SpO2 96%   BMI 27.75 kg/m  SpO2: SpO2:  96 % O2 Device: O2 Device: Nasal Cannula O2 Flow Rate: O2 Flow Rate (L/min): 2 L/min  Intake/output summary:  Intake/Output Summary (Last 24 hours) at 02/19/17 1459 Last data filed at 02/19/17 1100  Gross per 24 hour  Intake             3035 ml  Output                0 ml  Net             3035 ml   LBM: Last BM Date: 02/17/17 Baseline Weight: Weight: 68 kg (150 lb) Most recent weight: Weight: 78 kg (171 lb 14.4 oz)       Palliative Assessment/Data:    Flowsheet Rows   Flowsheet Row Most Recent Value  Intake Tab  Referral Department  Oncology  Unit at Time of Referral  Cardiac/Telemetry Unit  Palliative Care Primary Diagnosis  Cancer  Date Notified  02/18/17  Palliative Care Type  New Palliative care  Reason for referral  Clarify Goals of Care  Date of Admission  02/15/17  Date first seen by Palliative Care  02/18/17  # of days IP prior to Palliative referral  3  Clinical Assessment  Palliative Performance Scale Score  30%  Pain Max last 24 hours  8  Pain Min Last 24 hours  0  Psychosocial & Spiritual Assessment  Palliative Care Outcomes  Patient/Family meeting held?  Yes  Who was at the meeting?  patient, wife  Palliative Care Outcomes  Changed CPR status, Provided end of life care assistance, Clarified goals of care      Patient Active Problem List   Diagnosis Date Noted  . SOB (shortness of breath)   . Bleeding from the nose 02/15/2017  . Sepsis ([place]): Probable 02/15/2017  . Weakness   . Protein-calorie malnutrition, severe 02/14/2017  . PE (pulmonary thromboembolism)  ([place]) 02/12/2017  . Elevated troponin 02/12/2017  . Hyponatremia 02/12/2017  . Leukocytosis 02/12/2017  . Hypercalcemia 01/23/2017  . Goals of care, counseling/discussion 01/23/2017  . Mediastinal adenopathy   . Hepatocellular carcinoma ([place]) 12/16/2016  . Hepatitis C 09/02/2016  . Diabetes ([place]) 09/02/2016  . Prediabetes 08/31/2016  . Dyslipidemia 11/20/2010  . Essential hypertension 11/20/2010  . Chronic hepatitis B virus infection ([place]) 02/15/2004    Palliative Care Assessment & Plan   Patient Profile: 56 y.o. male  with past medical history of  HCV, metastatic [place], PE on Eliquis admitted on 02/15/2017 with nosebleed.  He has been evaluated by oncology and is not a candidate for further disease modifying therapy.   Recommendations/Plan: - Plan is for discharge home with the support of hospice.  He is not interested in discussing possibility of residential hospice or any discharge plan other than going to his own home. I did call and discuss with hospice liaison.  Hospice is working to honor his wishes of going home, but also aware that he may need a higher level of care (such as [employer]) in the very near future. - Dyspnea: Continue breathing treatments. He has also ordered OxyContin 15 mg twice a day as well as breakthrough when necessary medications.  I agree with plan for addition of steroids as well as oral morphine concentrate (Roxanol). He has not received much in the way of when necessary medications over the past 48 hours. I talked to his bedside nurse, and plan to be aggressive with when necessary medications in order to establish his needs for transition home. I  asked her to deliver a dose of Roxanol at this time due to acute distress.  We'll plan to reassess in the a.m.  - I also discussed case with Dr. Benay Spice. He does have a port if we need to utilize it, and I agree that he may be well served with continuous infusion if his symptoms are not well-controlled oral  route.  Goals of Care and Additional Recommendations:  Limitations on Scope of Treatment: Avoid Hospitalization and plan for home with hospice  Code Status:    Code Status Orders        Start     Ordered   02/18/17 1432  Do not attempt resuscitation (DNR)  Continuous    Question Answer Comment  In the event of cardiac or respiratory ARREST Do not call a "code blue"   In the event of cardiac or respiratory ARREST Do not perform Intubation, CPR, defibrillation or ACLS   In the event of cardiac or respiratory ARREST Use medication by any route, position, wound care, and other measures to relive pain and suffering. May use oxygen, suction and manual treatment of airway obstruction as needed for comfort.      02/18/17 1431    Code Status History    Date Active Date Inactive Code Status Order ID Comments User Context   02/15/2017  5:27 AM 02/18/2017  2:31 PM Full Code [id number]  Ivor Costa, MD ED   02/12/2017  4:23 AM 02/14/2017  3:06 PM Full Code [id number]  Ivor Costa, MD ED       Prognosis:   < 2 weeks most likely  Discharge Planning:  Home with Hospice  Care plan was discussed with patient, wife, RN, and Dr. Benay Spice  Thank you for allowing the Palliative Medicine Team to assist in the care of this patient.   Time In: 1250 Time Out: 1330 Total Time 40 Prolonged Time Billed No      Greater than 50%  of this time was spent counseling and coordinating care related to the above assessment and plan.  Micheline Rough, MD  Please contact Palliative Medicine Team phone at [phone] for questions and concerns.

## 2017-02-19 NOTE — Progress Notes (Signed)
PROGRESS NOTE    Justin [name]  PPJ:[id number] DOB: 1961-11-14 DOA: 02/15/2017 PCP: Delman Cheadle, MD   Brief Narrative: 56 y.o.malewith medical history significant of prediabetes, HCV, metastasized to liver cancer on chemotherapy, newly diagnosed PE on Eliquis, who presented with nose bleeding. Patient was hospitalized from 03/6-3/9 due to newly diagnosed pulmonary embolism. He was started Eliquis at discharge.pt was found to have slight hemoglobin dropped from 10.5 on 02/13/17-->10.0, WBC 29,lactate 2.50, sodium 126, creatinine normal, negative urinalysis, lipase 95, temperature 99.8, tachycardia, tachypnea, blood pressure 117/80.  Pt is now DNR/comfort care. Likely discharge to hospice in 1-2 days.   Assessment & Plan:  # Acute bilateral PE in the setting of hepatocellular carcinoma: Currently on Eliquis. No further epistaxis.  -Patient continued to have shortness of breath. Repeat chest x-ray ordered with no evidence of CHF or pneumonia.  -Patient was restarted on Solu-Medrol, increased the dose of OxyContin and added morphine for the management of dyspnea by oncologist. -Order bronchodilators and referred respiratory therapist.  #Epistaxis:Patient has been assessed by ENT and patient is status post nasal endoscopy with cautery of anterior epistaxis. No further bleeding. Patient may follow-up with ENT,Dr. Redmond Baseman in about 2 weeks.  #Presumed sepsis on admission which is ruled out. The patient's presentation of leukocytosis tachycardia likely in the setting of PE and progressive metastatic hepatocellular carcinoma with increasing tumor burden. Off antibiotics. Cultures are negative so far.  #Hyponatremia, severe protein calorie malnutrition: In the setting of malignancy. Sodium level is stable. No further lab monitoring since patient is going to hospice care.  #Metastatic hepatocellular carcinoma: Oncology is following the patient and pain medications adjusted. Patient was also evaluated by  palliative care service. Patient is DO NOT RESUSCITATE comfort care and planning for home hospice on discharge. Discussed with the patient and his wife and family members at bedside.   Principal Problem:   PE (pulmonary thromboembolism) ([place]) Active Problems:   Essential hypertension   Hepatocellular carcinoma ([facility])   Hyponatremia   Leukocytosis   Protein-calorie malnutrition, severe   Bleeding from the nose   Sepsis [facility]): Probable  DVT prophylaxis: Systemic anticoagulation Code Status: DO NOT RESUSCITATE comfort measures Family Communication: Discussed with the patient's wife and family members at bedside Disposition Plan: Likely discharge to home hospice in 1-2 days.    Consultants:   Oncologist  Palliative care  ENT: Dr. Melida Quitter 02/15/2017  Procedures: Nasal endoscopy with cautery of anterior epistaxis per Dr. Redmond Baseman 02/15/2017 Antimicrobials:  IV vancomycin 02/15/2017>>>>> 02/16/2017  IV Zosyn 02/15/2017 Subjective: Patient was seen and examined at bedside. Patient continues to complain of shortness of breath and not feeling well. No nausea vomiting headache. No abdominal pain. Patient's wife and family members at bedside  Objective: Vitals:   02/19/17 0800 02/19/17 0900 02/19/17 1000 02/19/17 1100  BP: (!) 145/123 (!) 134/92 (!) 118/97 (!) 138/98  Pulse:    (!) 145  Resp:      Temp:    98.9 F (37.2 C)  TempSrc:    Oral  SpO2:    95%  Weight:      Height:        Intake/Output Summary (Last 24 hours) at 02/19/17 1248 Last data filed at 02/19/17 1100  Gross per 24 hour  Intake             3389 ml  Output                0 ml  Net  3389 ml   Filed Weights   02/17/17 0555 02/18/17 0430 02/19/17 0423  Weight: 72.6 kg (160 lb) 72.5 kg (159 lb 12.8 oz) 78 kg (171 lb 14.4 oz)    Examination:  General exam: Ill-looking male lying in bed  Respiratory system: Clear to auscultation. Mild increased work of breathing, no wheezing Cardiovascular  system: S1 & S2 heard, regular tachycardic.  No pedal edema. Gastrointestinal system: Abdomen is nondistended, soft and nontender. Normal bowel sounds heard. Central nervous system: Alert awake and following commands Skin: No rashes, lesions or ulcers    Data Reviewed: I have personally reviewed following labs and imaging studies  CBC:  Recent Labs Lab 02/15/17 0141 02/15/17 1610 02/16/17 0523 02/17/17 0235 02/18/17 0257 02/19/17 0259  WBC 29.0* 26.5* 26.4* 32.3* 34.3* 35.3*  NEUTROABS 23.2* 22.0* 21.7*  --   --   --   HGB 10.0* 9.0* 8.3* 9.0* 8.9* 8.7*  HCT 29.9* 27.8* 25.8* 28.3* 28.0* 27.2*  MCV 76.5* 77.7* 77.5* 78.4 77.8* 77.9*  PLT 332 296 308 370 371 409   Basic Metabolic Panel:  Recent Labs Lab 02/16/17 0523 02/17/17 0235 02/18/17 0257 02/18/17 1430 02/19/17 0259  NA 129* 128* 124* 123* 124*  K 3.1* 4.0 3.7 3.8 3.9  CL 100* 97* 92* 91* 91*  CO2 22 22 22  21* 22  GLUCOSE 125* 135* 129* 139* 114*  BUN 7 6 7 6 8   CREATININE 0.58* 0.71 0.57* 0.56* 0.63  CALCIUM 8.1* 8.5* 9.1 9.1 9.0   GFR: Estimated Creatinine Clearance: 98.9 mL/min (by C-G formula based on SCr of 0.63 mg/dL). Liver Function Tests:  Recent Labs Lab 02/15/17 0141  AST 48*  ALT 28  ALKPHOS 139*  BILITOT 1.0  PROT 6.7  ALBUMIN 2.5*    Recent Labs Lab 02/15/17 0141  LIPASE 95*   No results for input(s): AMMONIA in the last 168 hours. Coagulation Profile:  Recent Labs Lab 02/15/17 0533  INR 1.86   Cardiac Enzymes:  Recent Labs Lab 02/12/17 1318 02/12/17 2300  TROPONINI 0.05* 0.08*   BNP (last 3 results) No results for input(s): PROBNP in the last 8760 hours. HbA1C: No results for input(s): HGBA1C in the last 72 hours. CBG:  Recent Labs Lab 02/17/17 1202 02/17/17 1612 02/17/17 2029 02/18/17 0729 02/19/17 0737  GLUCAP 123* 150* 136* 115* 137*   Lipid Profile: No results for input(s): CHOL, HDL, LDLCALC, TRIG, CHOLHDL, LDLDIRECT in the last 72 hours. Thyroid  Function Tests: No results for input(s): TSH, T4TOTAL, FREET4, T3FREE, THYROIDAB in the last 72 hours. Anemia Panel: No results for input(s): VITAMINB12, FOLATE, FERRITIN, TIBC, IRON, RETICCTPCT in the last 72 hours. Sepsis Labs:  Recent Labs Lab 02/15/17 0305 02/15/17 0327 02/15/17 0533 02/15/17 0538 02/16/17 0523  PROCALCITON  --   --  0.83  --   --   LATICACIDVEN 2.50* 2.8*  --  2.2* 1.9    Recent Results (from the past 240 hour(s))  MRSA PCR Screening     Status: None   Collection Time: 02/12/17  4:51 AM  Result Value Ref Range Status   MRSA by PCR NEGATIVE NEGATIVE Final    Comment:        The GeneXpert MRSA Assay (FDA approved for NASAL specimens only), is one component of a comprehensive MRSA colonization surveillance program. It is not intended to diagnose MRSA infection nor to guide or monitor treatment for MRSA infections.   Urine culture     Status: None   Collection Time: 02/15/17  1:39 AM  Result Value Ref Range Status   Specimen Description URINE, RANDOM  Final   Special Requests NONE  Final   Culture   Final    NO GROWTH Performed at [facility], [address], [place], [place] [address]    Report Status 02/16/2017 FINAL  Final  Culture, blood (x 2)     Status: None (Preliminary result)   Collection Time: 02/15/17  5:38 AM  Result Value Ref Range Status   Specimen Description BLOOD RIGHT ANTECUBITAL  Final   Special Requests IN PEDIATRIC BOTTLE 4CC  Final   Culture   Final    NO GROWTH 4 DAYS Performed at [facility], [place] [address] [place], [place] [address]    Report Status PENDING  Incomplete  Culture, blood (x 2)     Status: None (Preliminary result)   Collection Time: 02/15/17  5:40 AM  Result Value Ref Range Status   Specimen Description BLOOD LEFT ANTECUBITAL  Final   Special Requests BOTTLES DRAWN AEROBIC ONLY 5CC  Final   Culture   Final    NO GROWTH 4 DAYS Performed at [facility], [place] [address]  [place], [place] [address]    Report Status PENDING  Incomplete         Radiology Studies: Dg Chest Port 1 View  Result Date: 02/19/2017 CLINICAL DATA:  Shortness of breath EXAM: PORTABLE CHEST 1 VIEW COMPARISON:  Portable chest x-ray of February 15, 2017 FINDINGS: The patient is positioned in a lordotic matter which accentuates hypoinflation. The right hemidiaphragm remains higher than the left. No discrete infiltrate is observed. No definite cardiomegaly or pulmonary vascular congestion. There is mild gaseous distention of the stomach. The power port catheter tip projects over the proximal to mid SVC. IMPRESSION: Limited study due to patient positioning. Significant right-sided hypoinflation due to the elevated hemidiaphragm, stable. No definite acute pneumonia nor CHF. Electronically Signed   By: David  Martinique M.D.   On: 02/19/2017 11:48        Scheduled Meds: . apixaban  5 mg Oral BID  . famotidine  20 mg Oral BID  . feeding supplement (ENSURE ENLIVE)  237 mL Oral TID BM  . ferrous sulfate  325 mg Oral BID WC  . ipratropium-albuterol  3 mL Nebulization Q6H  . methylPREDNISolone (SOLU-MEDROL) injection  60 mg Intravenous Q12H  . oxyCODONE  15 mg Oral Q12H  . pantoprazole  40 mg Oral Q0600  . piperacillin-tazobactam (ZOSYN)  IV  3.375 g Intravenous Q8H  . polyethylene glycol  17 g Oral BID  . senna-docusate  1 tablet Oral BID  . sodium chloride  2 spray Each Nare Q2H while awake  . sodium chloride flush  3 mL Intravenous Q12H   Continuous Infusions: . sodium chloride 10 mL/hr at 02/19/17 1023     LOS: 4 days    Salvador Bigbee Tanna Furry, MD [facility] Pager [phone]  If 7PM-7AM, please contact night-coverage [email or web address] Password [facility] 02/19/2017, 12:48 PM

## 2017-02-20 ENCOUNTER — Telehealth: Payer: Self-pay | Admitting: Oncology

## 2017-02-20 LAB — CULTURE, BLOOD (ROUTINE X 2)
CULTURE: NO GROWTH
CULTURE: NO GROWTH

## 2017-02-20 LAB — GLUCOSE, CAPILLARY: Glucose-Capillary: 161 mg/dL — ABNORMAL HIGH (ref 65–99)

## 2017-02-20 MED ORDER — PREDNISONE 50 MG PO TABS: 50.0000 mg | ORAL_TABLET | Freq: Every day | ORAL | 0 refills | Status: AC

## 2017-02-20 MED ORDER — MORPHINE SULFATE ER 15 MG PO TBCR: 15.0000 mg | EXTENDED_RELEASE_TABLET | Freq: Two times a day (BID) | ORAL | 0 refills | Status: AC

## 2017-02-20 MED ORDER — OXYCODONE HCL 5 MG PO TABS: 5.0000 mg | ORAL_TABLET | Freq: Four times a day (QID) | ORAL | 0 refills | Status: AC | PRN

## 2017-02-20 MED ORDER — MORPHINE SULFATE (CONCENTRATE) 10 MG/0.5ML PO SOLN: 10.0000 mg | ORAL | 0 refills | Status: AC | PRN

## 2017-02-20 MED ORDER — POLYETHYLENE GLYCOL 3350 17 G PO PACK
17.0000 g | PACK | Freq: Two times a day (BID) | ORAL | 0 refills | Status: AC
Start: 2017-02-20 — End: ?

## 2017-02-20 MED ORDER — OXYCODONE HCL ER 15 MG PO T12A: 15.0000 mg | EXTENDED_RELEASE_TABLET | Freq: Two times a day (BID) | ORAL | 0 refills | Status: DC

## 2017-02-20 MED ORDER — ZOLPIDEM TARTRATE 5 MG PO TABS: 5.0000 mg | ORAL_TABLET | Freq: Every evening | ORAL | 0 refills | Status: AC | PRN

## 2017-02-20 NOTE — Progress Notes (Addendum)
[facility] Liaison  Received call from [place], [facility], about patient equipment not being delivered yesterday.  I was told by [facility] yesterday that the equipment would be delivered between 1 - 5.   Hassan Rowan, [facility], advised that they were still waiting on O2.  I contacted [facility] today and they advised that no delivery to the home yesterday because they called the wife right before delivery and she advised them not to deliver yesterday.    I contacted Hassan Rowan [facility], back to advise what [facility] told me.  Unfortunately, they advised no way to expedite.  That the delivery would be made between 4pm - 8pm today.    Thanks.   Justin Gunnels, RN, [facility] Liaison [phone]  **1245 update Hassan Rowan, [place] called me back to advise that she contacted Brad who call logistics and set delivery for as close to 4pm as possible - because that is when family would be there.   [facility] to call me when delivered and I will follow up with Merrily Pew, [place] to advise of same, per [facility] request.

## 2017-02-20 NOTE — Progress Notes (Signed)
[facility] Liaison  I contacted [facility] again regarding delivery of equipment status.  They were unable to tell me of delivery time other than 4pm - 8pm.  I explained that they had told "[employer]" that they would try to get the equipment out for patient by 400pm.   They were unable to contact the driver at this time.  Jazz told me she would call me as soon as they hear from the driver.  I called Josh, RN to advise of same.     Will continue to monitor.   Thanks,  Edyth Gunnels, RN, [facility] Liaison [phone]

## 2017-02-20 NOTE — Progress Notes (Signed)
Daily Progress Note   Patient Name: Justin [name]       Date: 02/20/2017 DOB: Jun 19, 1961  Age: 56 y.o. MRN#: [id number] Attending Physician: Rosita Fire, MD Primary Care Physician: Delman Cheadle, MD Admit Date: 02/15/2017  Reason for Consultation/Follow-up: Establishing goals of care  Subjective: I met today with Justin [name] and Justin [name] wife.  Justin [name] reports feeling much better today.  Justin [name] is looking forward to getting home with the support of hospice.    Length of Stay: 5  Current Medications: Scheduled Meds:  . apixaban  5 mg Oral BID  . famotidine  20 mg Oral BID  . feeding supplement (ENSURE ENLIVE)  237 mL Oral TID BM  . ferrous sulfate  325 mg Oral BID WC  . methylPREDNISolone (SOLU-MEDROL) injection  60 mg Intravenous Q12H  . oxyCODONE  15 mg Oral Q12H  . pantoprazole  40 mg Oral Q0600  . polyethylene glycol  17 g Oral BID  . senna-docusate  1 tablet Oral BID  . sodium chloride  2 spray Each Nare Q2H while awake  . sodium chloride flush  3 mL Intravenous Q12H    Continuous Infusions: . sodium chloride 10 mL/hr at 02/19/17 1023    PRN Meds: acetaminophen **OR** acetaminophen, bisacodyl, calcium carbonate, ipratropium-albuterol, levalbuterol, morphine injection, morphine CONCENTRATE, ondansetron (ZOFRAN) IV, ondansetron, prochlorperazine, zolpidem  Physical Exam      General: Alert, awake, in no moderate respiratory distress.  Heart: Tachycardic. No murmur appreciated. Lungs: Fair air movement, exp wheeze Abdomen: Soft, nontender, nondistended, positive bowel sounds.  Ext: No significant edema Neuro: Grossly intact, nonfocal.     Vital Signs: BP (!) 123/93   Pulse (!) 113   Temp 97.9 F (36.6 C) (Oral)   Resp 20   Ht '5\' 6"'  (1.676 m)   Wt 78 kg (172 lb)   SpO2 98%    BMI 27.76 kg/m  SpO2: SpO2: 98 % O2 Device: O2 Device: Nasal Cannula O2 Flow Rate: O2 Flow Rate (L/min): 2 L/min  Intake/output summary:   Intake/Output Summary (Last 24 hours) at 02/20/17 1204 Last data filed at 02/20/17 0500  Gross per 24 hour  Intake          1224.08 ml  Output                0 ml  Net          1224.08 ml   LBM: Last BM Date: 02/17/17 Baseline Weight: Weight: 68 kg (150 lb) Most recent weight: Weight: 78 kg (172 lb)       Palliative Assessment/Data:    Flowsheet Rows     Most Recent Value  Intake Tab  Referral Department  Oncology  Unit at Time of Referral  Cardiac/Telemetry Unit  Palliative Care Primary Diagnosis  Cancer  Date Notified  02/18/17  Palliative Care Type  New Palliative care  Reason for referral  Clarify Goals of Care  Date of Admission  02/15/17  Date first seen by Palliative Care  02/18/17  # of days IP prior to Palliative referral  3  Clinical Assessment  Palliative Performance Scale Score  30%  Pain Max last 24 hours  8  Pain Min Last 24 hours  0  Psychosocial & Spiritual Assessment  Palliative Care Outcomes  Patient/Family meeting held?  Yes  Who was at the meeting?  patient, wife  Palliative Care Outcomes  Changed CPR status, Provided end of life care assistance, Clarified goals of care      Patient Active Problem List   Diagnosis Date Noted  . SOB (shortness of breath)   . Bleeding from the nose 02/15/2017  . Sepsis ([place]): Probable 02/15/2017  . Weakness   . Protein-calorie malnutrition, severe 02/14/2017  . PE (pulmonary thromboembolism) ([place]) 02/12/2017  . Elevated troponin 02/12/2017  . Hyponatremia 02/12/2017  . Leukocytosis 02/12/2017  . Hypercalcemia 01/23/2017  . Goals of care, counseling/discussion 01/23/2017  . Mediastinal adenopathy   . Hepatocellular carcinoma ([place]) 12/16/2016  . Hepatitis C 09/02/2016  . Diabetes ([place]) 09/02/2016  . Prediabetes 08/31/2016  . Dyslipidemia 11/20/2010  . Essential  hypertension 11/20/2010  . Chronic hepatitis B virus infection ([place]) 02/15/2004    Palliative Care Assessment & Plan   Patient Profile: 56 y.o. male  with past medical history of  HCV, metastatic [place], PE on Eliquis admitted on 02/15/2017 with nosebleed.  Justin [name] has been evaluated by oncology and is not a candidate for further disease modifying therapy.   Recommendations/Plan: - Plan is for discharge home with the support of hospice. Discussed plan for discharge and Justin [name] reports feeling comfortable with plan. - Dyspnea: Discussed with Dr. Benay Spice this AM.  I agree with recommendations from Dr. Benay Spice for MS Contin, prednisone and morphine concentrate on discharge.   Goals of Care and Additional Recommendations:  Limitations on Scope of Treatment: Avoid Hospitalization and plan for home with hospice  Code Status:    Code Status Orders        Start     Ordered   02/18/17 1432  Do not attempt resuscitation (DNR)  Continuous    Question Answer Comment  In the event of cardiac or respiratory ARREST Do not call a "code blue"   In the event of cardiac or respiratory ARREST Do not perform Intubation, CPR, defibrillation or ACLS   In the event of cardiac or respiratory ARREST Use medication by any route, position, wound care, and other measures to relive pain and suffering. May use oxygen, suction and manual treatment of airway obstruction as needed for comfort.      02/18/17 1431    Code Status History    Date Active Date Inactive Code Status Order ID Comments User Context   02/15/2017  5:27 AM 02/18/2017  2:31 PM Full Code [id number]  Ivor Costa, MD ED   02/12/2017  4:23 AM 02/14/2017  3:06 PM Full Code [id number]  Ivor Costa, MD ED       Prognosis:   < 2 weeks most likely  Discharge Planning:  Home with Hospice  Care plan was discussed with patient, wife, RN, and Dr. Benay Spice  Thank you for allowing the Palliative Medicine Team to assist in the care of this patient.   Total Time 20  Prolonged Time Billed No      Greater than 50%  of this time was spent counseling and coordinating care related to the above assessment and plan.  Micheline Rough, MD  Please contact Palliative Medicine Team phone at [phone] for questions and concerns.

## 2017-02-20 NOTE — Progress Notes (Signed)
Physical Therapy Treatment Patient Details Name: Justin [name] MRN: [id number] DOB: 09/30/1961 Today's Date: 02/20/2017    History of Present Illness Justin [name] is a 56 y.o. 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      PT Comments    Pt able to ambulate 80 ft with rw and min assist for balance. Family observing session throughout and attentive. Pt refusing use of gait belt or O2, SpO2 dropping to low 90s to upper 80s. Pt and family confirm feeling confident with returning home and they deny any questions or concerns. Recommending assist with all mobility at this time. Pt and family agree.    Follow Up Recommendations  No PT follow up;Supervision/Assistance - 24 hour     Equipment Recommendations  Hospital bed;Wheelchair cushion (measurements PT);Rolling walker with 5" wheels;Other (comment);3in1 (PT)    Recommendations for Other Services       Precautions / Restrictions Precautions Precautions: Fall Restrictions Weight Bearing Restrictions: No    Mobility  Bed Mobility Overal bed mobility: Needs Assistance Bed Mobility: Supine to Sit     Supine to sit: Supervision     General bed mobility comments: using rail for assist with coming to sitting. HOB elevated.   Transfers Overall transfer level: Needs assistance Equipment used: Rolling walker (2 wheeled) Transfers: Sit to/from Stand Sit to Stand: Min guard         General transfer comment: Assist provided for balance initially, pt refusing use of gait belt or O2.   Ambulation/Gait Ambulation/Gait assistance: Min assist Ambulation Distance (Feet): 80 Feet Assistive device: Rolling walker (2 wheeled) Gait Pattern/deviations: Step-through pattern;Trunk flexed;Decreased stride length Gait velocity: decreased   General Gait Details: Decreased  strides and cues for posture provided. Unsteady with initial stand and with first 25 ft but improving over time. SpO2 dropping to low 90s-upper 80s.    Stairs            Wheelchair Mobility    Modified Rankin (Stroke Patients Only)       Balance Overall balance assessment: Needs assistance Sitting-balance support: Feet supported;No upper extremity supported Sitting balance-Leahy Scale: Good     Standing balance support: Bilateral upper extremity supported Standing balance-Leahy Scale: Poor Standing balance comment: using rw for support and physical support provided                    Cognition Arousal/Alertness: Awake/alert Behavior During Therapy: WFL for tasks assessed/performed Overall Cognitive Status: Within Functional Limits for tasks assessed                      Exercises      General Comments        Pertinent Vitals/Pain Pain Assessment: No/denies pain    Home Living                      Prior Function            PT Goals (current goals can now be found in the care plan section) Acute Rehab PT Goals Patient Stated Goal: get home PT Goal Formulation: With patient/family Time For Goal Achievement: 02/25/17 Potential to Achieve Goals: Fair Progress towards PT goals: Progressing toward goals    Frequency    Min 3X/week      PT Plan Current plan remains appropriate    Co-evaluation  End of Session Equipment Utilized During Treatment:  (pt refused gait belt and oxygen) Activity Tolerance: Patient limited by fatigue Patient left: in chair;with call bell/phone within reach;with family/visitor present Nurse Communication: Mobility status PT Visit Diagnosis: Muscle weakness (generalized) (M62.81);Difficulty in walking, not elsewhere classified (R26.2)     Time: 9906-8934 PT Time Calculation (min) (ACUTE ONLY): 16 min  Charges:  $Gait Training: 8-22 mins                    G Codes:        Cassell Clement, PT, CSCS Pager [phone] Office [phone]  02/20/2017, 11:16 AM

## 2017-02-20 NOTE — Discharge Summary (Signed)
Physician Discharge Summary  Justin [name] OIN:[id number] DOB: 31-May-1961 DOA: 02/15/2017  PCP: Delman Cheadle, MD  Admit date: 02/15/2017 Discharge date: 02/20/2017  Admitted From:home Disposition:home with hospice.  Recommendations for Outpatient Follow-up:  1. Follow up with PCP and oncologist in 1-2 weeks  Home Health:hospice care Discharge Condition:hospice CODE STATUS:dnr Diet recommendation:regular, as tolerated  Brief/Interim Summary:56 y.o.malewith medical history significant of prediabetes, HCV, metastasized to liver cancer on chemotherapy, newly diagnosed PE on Eliquis, who presented with nose bleeding. Patient was hospitalized from 03/6-3/9 due to newly diagnosed pulmonary embolism. He was started Eliquis at discharge.pt was found to have slight hemoglobin dropped from 10.5 on 02/13/17-->10.0, WBC 29,lactate 2.50, sodium 126, creatinine normal, negative urinalysis, lipase 95, temperature 99.8, tachycardia, tachypnea, blood pressure 117/80.  # Acute bilateral PE in the setting of hepatocellular carcinoma: Currently on Eliquis. No further epistaxis.  -Patient was started on steroid and pain medications adjusted. He looked more comfortable today and reported that his shortness of breath is significantly better. I discussed with oncologist Dr. Benay Spice today regarding discharge planning. Plan to discharge home with hospice with pain medications including MS contin, oral oxycodone and oral morphine concentrates. Also discharged with prednisone 50 mg.   #Epistaxis:Patient has been assessed by ENT and patient is status post nasal endoscopy with cautery of anterior epistaxis. No further bleeding. Patient may follow-up with ENT,Dr. Redmond Baseman in about 2 weeks.  #Presumed sepsis on admission which is ruled out. The patient's presentation of leukocytosis tachycardia likely in the setting of PE and progressive metastatic hepatocellular carcinoma with increasing tumor burden. Off antibiotics. Cultures  are negative so far.  #Hyponatremia, severe protein calorie malnutrition: In the setting of malignancy. Sodium level is stable.   #Metastatic hepatocellular carcinoma: Oncology is following the patient and pain medications adjusted. Patient was also evaluated by palliative care service. Patient is DO NOT RESUSCITATE comfort care and planning for home hospice on discharge. Discussed with the patient and his wife at bedside today.  Patient is better symptomatically today. Discussed with the oncologist. Pain medications adjusted including oxycodone, MS Contin and morphine concentrate has patient is going to home with hospice care. Patient will follow-up with PCP and oncologist outpatient.  Discharge Diagnoses:  Principal Problem:   PE (pulmonary thromboembolism) ([place]) Active Problems:   Essential hypertension   Hepatocellular carcinoma ([facility])   Hyponatremia   Leukocytosis   Protein-calorie malnutrition, severe   Bleeding from the nose   Sepsis [facility]): Probable   SOB (shortness of breath)    Discharge Instructions  Discharge Instructions    Call MD for:  difficulty breathing, headache or visual disturbances    Complete by:  As directed    Call MD for:  extreme fatigue    Complete by:  As directed    Call MD for:  hives    Complete by:  As directed    Call MD for:  persistant dizziness or light-headedness    Complete by:  As directed    Call MD for:  persistant nausea and vomiting    Complete by:  As directed    Call MD for:  severe uncontrolled pain    Complete by:  As directed    Call MD for:  temperature >100.4    Complete by:  As directed    Diet - low sodium heart healthy    Complete by:  As directed    Increase activity slowly    Complete by:  As directed      Allergies as of 02/20/2017  Reactions   Lisinopril Other (See Comments)   coughing      Medication List    STOP taking these medications   oxyCODONE-acetaminophen 5-325 MG tablet Commonly known as:   PERCOCET/ROXICET     TAKE these medications   apixaban 5 MG Tabs tablet Commonly known as:  ELIQUIS Take 1 tablet (5 mg total) by mouth 2 (two) times daily. Start taking on:  02/21/2017 What changed:  Another medication with the same name was removed. Continue taking this medication, and follow the directions you see here.   bisacodyl 5 MG EC tablet Commonly known as:  DULCOLAX Take 5 mg by mouth daily as needed for moderate constipation.   calcium carbonate 500 MG chewable tablet Commonly known as:  TUMS - dosed in mg elemental calcium Chew 1 tablet by mouth daily as needed for indigestion or heartburn.   famotidine 20 MG tablet Commonly known as:  PEPCID Take 1 tablet (20 mg total) by mouth 2 (two) times daily.   feeding supplement (ENSURE ENLIVE) Liqd Take 237 mLs by mouth 3 (three) times daily between meals.   feeding supplement Liqd Take 1 Container by mouth daily.   ferrous sulfate 325 (65 FE) MG tablet Take 325 mg by mouth 2 (two) times daily with a meal.   lidocaine-prilocaine cream Commonly known as:  EMLA Apply to port site one hour prior to use. Do not rub in, cover with plastic.   morphine CONCENTRATE 10 MG/0.5ML Soln concentrated solution Take 0.5 mLs (10 mg total) by mouth every 4 (four) hours as needed for moderate pain or shortness of breath.   morphine 15 MG 12 hr tablet Commonly known as:  MS CONTIN Take 1 tablet (15 mg total) by mouth every 12 (twelve) hours.   naproxen sodium 220 MG tablet Commonly known as:  ANAPROX Take 220 mg by mouth 2 (two) times daily as needed (for pain/fever/headache.).   ondansetron 8 MG tablet Commonly known as:  ZOFRAN Take 1 tablet (8 mg total) by mouth every 8 (eight) hours as needed for nausea or vomiting.   oxyCODONE 5 MG immediate release tablet Commonly known as:  ROXICODONE Take 1 tablet (5 mg total) by mouth every 6 (six) hours as needed for severe pain.   polyethylene glycol packet Commonly known as:   MIRALAX / GLYCOLAX Take 17 g by mouth 2 (two) times daily.   potassium chloride SA 20 MEQ tablet Commonly known as:  K-DUR,KLOR-CON Take 1 tablet (20 mEq total) by mouth daily.   predniSONE 50 MG tablet Commonly known as:  DELTASONE Take 1 tablet (50 mg total) by mouth daily with breakfast.   prochlorperazine 10 MG tablet Commonly known as:  COMPAZINE Take 1 tablet (10 mg total) by mouth every 6 (six) hours as needed for nausea or vomiting.            Durable Medical Equipment        Start     Ordered   02/20/17 1048  DME Oxygen  Once    Question Answer Comment  Mode or (Route) Nasal cannula   Liters per Minute 2   Frequency Continuous (stationary and portable oxygen unit needed)   Oxygen conserving device Yes   Oxygen delivery system Gas      02/20/17 1047     Follow-up Information    [facility] Follow up.   Specialty:  Hospice and Palliative Medicine Why:  Registered Nurse.  Contact information: [place] [address] [phone]  Betsy Coder, MD Follow up on 02/27/2017.   Specialty:  Oncology Why:  please call to confirm the appointment and follow up Contact information: [place] [address] [phone]          Allergies  Allergen Reactions  . Lisinopril Other (See Comments)    coughing    Consultations: Oncology Palliative care  Procedures/Studies:  None Subjective:  Patient was seen and examined at bedside. He reported feeling better than yesterday. Shortness of breath is better. No nausea vomiting or abdominal pain. Wife at bedside Discharge Exam: Vitals:   02/20/17 0400 02/20/17 0800  BP: 107/70 (!) 123/93  Pulse: (!) 103 (!) 113  Resp:    Temp: 97.9 F (36.6 C)    Vitals:   02/20/17 0000 02/20/17 0400 02/20/17 0500 02/20/17 0800  BP: (!) 106/94 107/70  (!) 123/93  Pulse: (!) 111 (!) 103  (!) 113  Resp:      Temp:  97.9 F (36.6 C)    TempSrc:  Oral     SpO2: 97% 96%  98%  Weight:   78 kg (172 lb)   Height:        General: Pt is alert, awake, not in acute distress Cardiovascular: Regular tachycardia, S1/S2 +, no rubs, no gallops Respiratory: CTA bilaterally, no wheezing, no rhonchi Abdominal: Soft, NT, ND, bowel sounds + Extremities: no edema, no cyanosis    The results of significant diagnostics from this hospitalization (including imaging, microbiology, ancillary and laboratory) are listed below for reference.     Microbiology: Recent Results (from the past 240 hour(s))  MRSA PCR Screening     Status: None   Collection Time: 02/12/17  4:51 AM  Result Value Ref Range Status   MRSA by PCR NEGATIVE NEGATIVE Final    Comment:        The GeneXpert MRSA Assay (FDA approved for NASAL specimens only), is one component of a comprehensive MRSA colonization surveillance program. It is not intended to diagnose MRSA infection nor to guide or monitor treatment for MRSA infections.   Urine culture     Status: None   Collection Time: 02/15/17  1:39 AM  Result Value Ref Range Status   Specimen Description URINE, RANDOM  Final   Special Requests NONE  Final   Culture   Final    NO GROWTH Performed at [facility], [address] [place], [place] [address]    Report Status 02/16/2017 FINAL  Final  Culture, blood (x 2)     Status: None (Preliminary result)   Collection Time: 02/15/17  5:38 AM  Result Value Ref Range Status   Specimen Description BLOOD RIGHT ANTECUBITAL  Final   Special Requests IN PEDIATRIC BOTTLE 4CC  Final   Culture   Final    NO GROWTH 4 DAYS Performed at [facility], [place] [address], [place], [place] [address]    Report Status PENDING  Incomplete  Culture, blood (x 2)     Status: None (Preliminary result)   Collection Time: 02/15/17  5:40 AM  Result Value Ref Range Status   Specimen Description BLOOD LEFT ANTECUBITAL  Final   Special Requests BOTTLES DRAWN AEROBIC ONLY 5CC  Final   Culture    Final    NO GROWTH 4 DAYS Performed at [facility], [place] [address] [place], [place] [address]    Report Status PENDING  Incomplete     Labs: BNP (last 3 results)  Recent Labs  02/12/17 0031  BNP 314.9*   Basic Metabolic Panel:  Recent Labs Lab 02/16/17 0523 02/17/17 0235 02/18/17 0257 02/18/17 1430 02/19/17 0259  NA 129* 128* 124* 123* 124*  K 3.1* 4.0 3.7 3.8 3.9  CL 100* 97* 92* 91* 91*  CO2 22 22 22  21* 22  GLUCOSE 125* 135* 129* 139* 114*  BUN 7 6 7 6 8   CREATININE 0.58* 0.71 0.57* 0.56* 0.63  CALCIUM 8.1* 8.5* 9.1 9.1 9.0   Liver Function Tests:  Recent Labs Lab 02/15/17 0141  AST 48*  ALT 28  ALKPHOS 139*  BILITOT 1.0  PROT 6.7  ALBUMIN 2.5*    Recent Labs Lab 02/15/17 0141  LIPASE 95*   No results for input(s): AMMONIA in the last 168 hours. CBC:  Recent Labs Lab 02/15/17 0141 02/15/17 1610 02/16/17 0523 02/17/17 0235 02/18/17 0257 02/19/17 0259  WBC 29.0* 26.5* 26.4* 32.3* 34.3* 35.3*  NEUTROABS 23.2* 22.0* 21.7*  --   --   --   HGB 10.0* 9.0* 8.3* 9.0* 8.9* 8.7*  HCT 29.9* 27.8* 25.8* 28.3* 28.0* 27.2*  MCV 76.5* 77.7* 77.5* 78.4 77.8* 77.9*  PLT 332 296 308 370 371 323   Cardiac Enzymes: No results for input(s): CKTOTAL, CKMB, CKMBINDEX, TROPONINI in the last 168 hours. BNP: Invalid input(s): POCBNP CBG:  Recent Labs Lab 02/17/17 1612 02/17/17 2029 02/18/17 0729 02/19/17 0737 02/20/17 0736  GLUCAP 150* 136* 115* 137* 161*   D-Dimer No results for input(s): DDIMER in the last 72 hours. Hgb A1c No results for input(s): HGBA1C in the last 72 hours. Lipid Profile No results for input(s): CHOL, HDL, LDLCALC, TRIG, CHOLHDL, LDLDIRECT in the last 72 hours. Thyroid function studies No results for input(s): TSH, T4TOTAL, T3FREE, THYROIDAB in the last 72 hours.  Invalid input(s): FREET3 Anemia work up No results for input(s): VITAMINB12, FOLATE, FERRITIN, TIBC, IRON, RETICCTPCT in the last 72 hours. Urinalysis     Component Value Date/Time   COLORURINE YELLOW 02/15/2017 0139   APPEARANCEUR CLEAR 02/15/2017 0139   LABSPEC 1.012 02/15/2017 0139   PHURINE 6.0 02/15/2017 0139   GLUCOSEU NEGATIVE 02/15/2017 0139   HGBUR NEGATIVE 02/15/2017 0139   HGBUR negative 01/18/2011 1006   BILIRUBINUR NEGATIVE 02/15/2017 0139   BILIRUBINUR small (A) 12/07/2016 0855   KETONESUR 20 (A) 02/15/2017 0139   PROTEINUR NEGATIVE 02/15/2017 0139   UROBILINOGEN 1.0 12/07/2016 0855   UROBILINOGEN 0.2 01/18/2011 1006   NITRITE NEGATIVE 02/15/2017 0139   LEUKOCYTESUR NEGATIVE 02/15/2017 0139   Sepsis Labs Invalid input(s): PROCALCITONIN,  WBC,  LACTICIDVEN Microbiology Recent Results (from the past 240 hour(s))  MRSA PCR Screening     Status: None   Collection Time: 02/12/17  4:51 AM  Result Value Ref Range Status   MRSA by PCR NEGATIVE NEGATIVE Final    Comment:        The GeneXpert MRSA Assay (FDA approved for NASAL specimens only), is one component of a comprehensive MRSA colonization surveillance program. It is not intended to diagnose MRSA infection nor to guide or monitor treatment for MRSA infections.   Urine culture     Status: None   Collection Time: 02/15/17  1:39 AM  Result Value Ref Range Status   Specimen Description URINE, RANDOM  Final   Special Requests NONE  Final   Culture   Final    NO GROWTH Performed at [facility], [address] [place], [place] [address]    Report Status 02/16/2017 FINAL  Final  Culture, blood (  x 2)     Status: None (Preliminary result)   Collection Time: 02/15/17  5:38 AM  Result Value Ref Range Status   Specimen Description BLOOD RIGHT ANTECUBITAL  Final   Special Requests IN PEDIATRIC BOTTLE 4CC  Final   Culture   Final    NO GROWTH 4 DAYS Performed at [facility], [place] [address] [place], [place] [address]    Report Status PENDING  Incomplete  Culture, blood (x 2)     Status: None (Preliminary result)   Collection Time: 02/15/17  5:40 AM   Result Value Ref Range Status   Specimen Description BLOOD LEFT ANTECUBITAL  Final   Special Requests BOTTLES DRAWN AEROBIC ONLY 5CC  Final   Culture   Final    NO GROWTH 4 DAYS Performed at [facility], [place] [address], [place], [place] [address]    Report Status PENDING  Incomplete     Time coordinating discharge: 33 minutes  SIGNED:   Rosita Fire, MD  [facility] 02/20/2017, 11:02 AM  If 7PM-7AM, please contact night-coverage [email or web address] Password [email or web address]

## 2017-02-20 NOTE — Progress Notes (Signed)
IP PROGRESS NOTE  Subjective:   He appears more comfortable today. He says that his abdomen feels better. He plans to go home with Hospice care today.   Objective: Vital signs in last 24 hours: Blood pressure (!) 123/93, pulse (!) 113, temperature 97.9 F (36.6 C), temperature source Oral, resp. rate 20, height 5\' 6"  (1.676 m), weight 172 lb (78 kg), SpO2 98 %.  Intake/Output from previous day: 03/14 0701 - 03/15 0700 In: 1344.1 [P.O.:360; I.V.:984.1] Out: -   Physical Exam:   Lungs: Decreased breath sounds throughout the right posterior chest, no respiratory distress Cardiac: Regular rate and rhythm Abdomen: No hepatosplenomegaly, no mass, nontender, mildly Extremities: No leg edema Skin: Multiple subcutaneous masses included masses at the right forearm, left mid back, right suprapubic area, and right mid abdomen, left neck, and left suprapubic area-masses appear smaller and more mobile today  Portacath/PICC-without erythema  Lab Results:  Recent Labs  02/18/17 0257 02/19/17 0259  WBC 34.3* 35.3*  HGB 8.9* 8.7*  HCT 28.0* 27.2*  PLT 371 323    BMET  Recent Labs  02/18/17 1430 02/19/17 0259  NA 123* 124*  K 3.8 3.9  CL 91* 91*  CO2 21* 22  GLUCOSE 139* 114*  BUN 6 8  CREATININE 0.56* 0.63  CALCIUM 9.1 9.0    Studies/Results: Dg Chest Port 1 View  Result Date: 02/19/2017 CLINICAL DATA:  Shortness of breath EXAM: PORTABLE CHEST 1 VIEW COMPARISON:  Portable chest x-ray of February 15, 2017 FINDINGS: The patient is positioned in a lordotic matter which accentuates hypoinflation. The right hemidiaphragm remains higher than the left. No discrete infiltrate is observed. No definite cardiomegaly or pulmonary vascular congestion. There is mild gaseous distention of the stomach. The power port catheter tip projects over the proximal to mid SVC. IMPRESSION: Limited study due to patient positioning. Significant right-sided hypoinflation due to the elevated hemidiaphragm,  stable. No definite acute pneumonia nor CHF. Electronically Signed   By: David  Martinique M.D.   On: 02/19/2017 11:48    Medications: I have reviewed the patient's current medications.  Assessment/Plan: 1. Hepatocellular carcinoma ? MRI abdomen 12/12/2016 consistent with a right hepatic HCC measuring 12.3 x 12.7 cm ? Markedly elevated AFP ? History of hepatitis B ? CTs chest, abdomen, and pelvis 12/17/2016-large right liver mass involving segments 7 and 8, possible involvement of the left liver. Nonspecific right lung nodules, nonspecific porta hepatis nodes, 1.5 cm right lower paraesophageal mediastinal node ? EUS biopsy of the paraesophageal lymph node on 12/26/2016 confirms metastatic hepatocellular carcinoma  ? CT abdomen and pelvis 01/23/2017 with marked progression of the right liver mass, hilar/esophageal lymph nodes, omentum, pancreas head, and subcutaneous nodules ? Cycle 1 gemcitabine/oxaliplatin 01/31/2017  2. Chronic hepatitis B infection  3. Fever  4. Nonproductive cough  5. Anorexia/weight loss  6. Microcytic anemia-likely iron deficiency as the red cell microcytosis is new. Stool Hemoccult cards negative on 01/22/2017  7. Hypertension-amlodipine dose increased to 10 mg 01/16/2017  8. Hypercalcemia of malignancy 01/23/2017-Zometa given 01/23/2017  9.  Bilateral pulmonary embolism 02/12/2017-heparin initiated  Lower extremity Dopplers 02/12/2017-negative for deep vein thrombosis  10. Admission 02/15/2017 with epistaxis, resolved   Mr.Dix appears much more comfortable today. It appears the steroids have helped. He appears stable for discharge to home with Hospice care. I will arrange for outpatient follow-up at the [facility] next week.  Recommendations: 1. MS Contin 15 mg twice daily and Roxanol for pain 2. Prednisone 60 mg daily 3. Continue Eliquis anticoagulation  4. Outpatient follow-up at the Cancer center 02/27/2017  I discussed the  case with Drs. Carolin Sicks and [place] today.    LOS: 5 days   Betsy Coder, MD   02/20/2017, 10:31 AM

## 2017-02-20 NOTE — Telephone Encounter (Signed)
Added f/u for 3/22 per 3/15 schedule message. Per desk nurse patient will be given f/u upon discharge from hospital today. Left message for patient and mailed schedule.

## 2017-02-21 ENCOUNTER — Observation Stay (HOSPITAL_COMMUNITY)
Admission: EM | Admit: 2017-02-21 | Discharge: 2017-02-22 | Disposition: A | Discharge status: Home / Self Care | Payer: 59 | Attending: Internal Medicine | Admitting: Internal Medicine

## 2017-02-21 ENCOUNTER — Encounter (HOSPITAL_COMMUNITY): Payer: Self-pay | Admitting: Emergency Medicine

## 2017-02-21 DIAGNOSIS — Z7901 Long term (current) use of anticoagulants: Secondary | ICD-10-CM | POA: Diagnosis not present

## 2017-02-21 DIAGNOSIS — R579 Shock, unspecified: Secondary | ICD-10-CM | POA: Insufficient documentation

## 2017-02-21 DIAGNOSIS — Z79899 Other long term (current) drug therapy: Secondary | ICD-10-CM | POA: Insufficient documentation

## 2017-02-21 DIAGNOSIS — J9601 Acute respiratory failure with hypoxia: Secondary | ICD-10-CM | POA: Diagnosis not present

## 2017-02-21 DIAGNOSIS — E119 Type 2 diabetes mellitus without complications: Secondary | ICD-10-CM | POA: Diagnosis not present

## 2017-02-21 DIAGNOSIS — Z515 Encounter for palliative care: Secondary | ICD-10-CM

## 2017-02-21 DIAGNOSIS — I10 Essential (primary) hypertension: Secondary | ICD-10-CM | POA: Diagnosis not present

## 2017-02-21 DIAGNOSIS — I959 Hypotension, unspecified: Secondary | ICD-10-CM | POA: Diagnosis present

## 2017-02-21 DIAGNOSIS — Z859 Personal history of malignant neoplasm, unspecified: Secondary | ICD-10-CM | POA: Diagnosis not present

## 2017-02-21 NOTE — ED Provider Notes (Signed)
[facility] DEPT Provider Note   CSN: [id number] Arrival date & time: 02/21/17  2339     History   Chief Complaint Chief Complaint  Patient presents with  . Hypotension    HPI Justin [name] is a 56 y.o. male.  56 yo M with a chief complaint of unresponsiveness. Patient was recently discharged from the hospital after having a nosebleed while on Eliquis. He also was recently diagnosed with a PE. Patient went home on hospice care and has been getting morphine for comfort. Family noted that he was unresponsive about 3 hours ago. They currently would just like to keep him comfortable.   The history is provided by the patient, a relative and the EMS personnel.  Illness  This is a new problem. The current episode started 1 to 2 hours ago. The problem occurs constantly. The problem has not changed since onset.Pertinent negatives include no chest pain, no abdominal pain, no headaches and no shortness of breath. Nothing aggravates the symptoms. Nothing relieves the symptoms. He has tried nothing for the symptoms. The treatment provided no relief.    Past Medical History:  Diagnosis Date  . Cancer ([place])   . Chronic hepatitis B virus infection ([place]) 02/15/2004   Annotation: 02/15/2004:  +Core Ab, +SAg, -SAb, -Be Ag with elevated liver  enzymes HBV DNA via PCR:  <2.3 AFP normal Evaluated by [facility] 07/24/2004--Dr. Patsy [place] Qualifier: Diagnosis of  By: Amil Amen MD, Benjamine Mola    . Diabetes mellitus without complication (HCC)    no meds  . High cholesterol 2006  . Hyperlipidemia   . Hypertension   . Prediabetes 08/31/2016   Lab Results Component Value Date  HGBA1C 6.2 (H) 08/30/2015      Patient Active Problem List   Diagnosis Date Noted  . End of life care March 18, 2017  . SOB (shortness of breath)   . Bleeding from the nose 02/15/2017  . Sepsis ([place]): Probable 02/15/2017  . Weakness   . Protein-calorie malnutrition, severe 02/14/2017  . PE (pulmonary thromboembolism) ([place])  02/12/2017  . Elevated troponin 02/12/2017  . Hyponatremia 02/12/2017  . Leukocytosis 02/12/2017  . Hypercalcemia 01/23/2017  . Goals of care, counseling/discussion 01/23/2017  . Mediastinal adenopathy   . Hepatocellular carcinoma ([place]) 12/16/2016  . Hepatitis C 09/02/2016  . Diabetes ([place]) 09/02/2016  . Prediabetes 08/31/2016  . Dyslipidemia 11/20/2010  . Essential hypertension 11/20/2010  . Chronic hepatitis B virus infection ([place]) 02/15/2004    Past Surgical History:  Procedure Laterality Date  . EUS N/A 12/26/2016   Procedure: UPPER ENDOSCOPIC ULTRASOUND (EUS) LINEAR;  Surgeon: Milus Banister, MD;  Location: [facility] ENDOSCOPY;  Service: Endoscopy;  Laterality: N/A;  . FINE NEEDLE ASPIRATION N/A 12/26/2016   Procedure: FINE NEEDLE ASPIRATION (FNA) LINEAR;  Surgeon: Milus Banister, MD;  Location: [facility] ENDOSCOPY;  Service: Endoscopy;  Laterality: N/A;  . IR GENERIC HISTORICAL  12/19/2016   IR RADIOLOGIST EVAL & MGMT 12/19/2016 Arne Cleveland, MD [facility] INTERV RAD  . IR GENERIC HISTORICAL  01/09/2017   IR ANGIOGRAM SELECTIVE EACH ADDITIONAL VESSEL 01/09/2017 Arne Cleveland, MD [facility]  . IR GENERIC HISTORICAL  01/09/2017   IR ANGIOGRAM VISCERAL SELECTIVE 01/09/2017 Arne Cleveland, MD [facility]  . IR GENERIC HISTORICAL  01/09/2017   IR ANGIOGRAM SELECTIVE EACH ADDITIONAL VESSEL 01/09/2017 Arne Cleveland, MD [facility]  . IR GENERIC HISTORICAL  01/09/2017   IR ANGIOGRAM VISCERAL SELECTIVE 01/09/2017 Arne Cleveland, MD [facility]  . IR GENERIC HISTORICAL  01/09/2017   IR ANGIOGRAM SELECTIVE  EACH ADDITIONAL VESSEL 01/09/2017 Arne Cleveland, MD [facility]  . IR GENERIC HISTORICAL  01/09/2017   IR US GUIDE VASC ACCESS RIGHT 01/09/2017 Arne Cleveland, MD [facility]  . IR GENERIC HISTORICAL  01/09/2017   IR EMBO ARTERIAL NOT HEMORR HEMANG INC GUIDE ROADMAPPING 01/09/2017 Arne Cleveland, MD [facility]  . IR GENERIC HISTORICAL  01/30/2017   IR FLUORO GUIDE PORT INSERTION RIGHT 01/30/2017 Markus Daft, MD  [facility]  . IR GENERIC HISTORICAL  01/30/2017   IR US GUIDE VASC ACCESS RIGHT 01/30/2017 Markus Daft, MD [facility]       Home Medications    Prior to Admission medications   Medication Sig Start Date End Date Taking? Authorizing Provider  apixaban (ELIQUIS) 5 MG TABS tablet Take 1 tablet (5 mg total) by mouth 2 (two) times daily. 02/21/17   Debbe Odea, MD  bisacodyl (DULCOLAX) 5 MG EC tablet Take 5 mg by mouth daily as needed for moderate constipation.    Historical Provider, MD  calcium carbonate (TUMS - DOSED IN MG ELEMENTAL CALCIUM) 500 MG chewable tablet Chew 1 tablet by mouth daily as needed for indigestion or heartburn.    Historical Provider, MD  famotidine (PEPCID) 20 MG tablet Take 1 tablet (20 mg total) by mouth 2 (two) times daily. 02/14/17   Debbe Odea, MD  feeding supplement (BOOST / RESOURCE BREEZE) LIQD Take 1 Container by mouth daily. 02/14/17   Debbe Odea, MD  feeding supplement, ENSURE ENLIVE, (ENSURE ENLIVE) LIQD Take 237 mLs by mouth 3 (three) times daily between meals. 02/14/17   Debbe Odea, MD  ferrous sulfate 325 (65 FE) MG tablet Take 325 mg by mouth 2 (two) times daily with a meal.    Historical Provider, MD  lidocaine-prilocaine (EMLA) cream Apply to port site one hour prior to use. Do not rub in, cover with plastic. 01/31/17   Ladell Pier, MD  morphine (MS CONTIN) 15 MG 12 hr tablet Take 1 tablet (15 mg total) by mouth every 12 (twelve) hours. 02/20/17   Dron Tanna Furry, MD  Morphine Sulfate (MORPHINE CONCENTRATE) 10 MG/0.5ML SOLN concentrated solution Take 0.5 mLs (10 mg total) by mouth every 4 (four) hours as needed for moderate pain or shortness of breath. 02/20/17   Dron Tanna Furry, MD  naproxen sodium (ANAPROX) 220 MG tablet Take 220 mg by mouth 2 (two) times daily as needed (for pain/fever/headache.).    Historical Provider, MD  ondansetron (ZOFRAN) 8 MG tablet Take 1 tablet (8 mg total) by mouth every 8 (eight) hours as needed for nausea or  vomiting. 01/23/17   Ladell Pier, MD  oxyCODONE (ROXICODONE) 5 MG immediate release tablet Take 1 tablet (5 mg total) by mouth every 6 (six) hours as needed for severe pain. 02/20/17   Dron Tanna Furry, MD  polyethylene glycol [facility] / Floria Raveling) packet Take 17 g by mouth 2 (two) times daily. 02/20/17   Dron Tanna Furry, MD  potassium chloride SA (K-DUR,KLOR-CON) 20 MEQ tablet Take 1 tablet (20 mEq total) by mouth daily. 01/23/17   Ladell Pier, MD  predniSONE (DELTASONE) 50 MG tablet Take 1 tablet (50 mg total) by mouth daily with breakfast. 02/20/17   Dron Tanna Furry, MD  prochlorperazine (COMPAZINE) 10 MG tablet Take 1 tablet (10 mg total) by mouth every 6 (six) hours as needed for nausea or vomiting. 01/23/17   Ladell Pier, MD  zolpidem (AMBIEN) 5 MG tablet Take 1 tablet (5 mg total) by mouth at bedtime  as needed for sleep. 02/20/17   Dron Tanna Furry, MD    Family History Family History  Problem Relation Age of Onset  . Heart disease Father   . Hyperlipidemia Father     Social History Social History  Substance Use Topics  . Smoking status: Never Smoker  . Smokeless tobacco: Never Used  . Alcohol use No     Allergies   Lisinopril   Review of Systems Review of Systems  Unable to perform ROS: Patient unresponsive  Constitutional: Negative for chills and fever.  HENT: Negative for congestion and facial swelling.   Eyes: Negative for discharge and visual disturbance.  Respiratory: Negative for shortness of breath.   Cardiovascular: Negative for chest pain and palpitations.  Gastrointestinal: Negative for abdominal pain, diarrhea and vomiting.  Musculoskeletal: Negative for arthralgias and myalgias.  Skin: Negative for color change and rash.  Neurological: Negative for tremors, syncope and headaches.  Psychiatric/Behavioral: Negative for confusion and dysphoric mood.     Physical Exam Updated Vital Signs BP (!) 82/52 (BP Location: Right Arm)    Pulse (!) 127   Temp 100.1 F (37.8 C) (Axillary)   Resp 20   SpO2 98%   Physical Exam  Constitutional: He appears well-developed and well-nourished.  HENT:  Head: Normocephalic and atraumatic.  Eyes: EOM are normal. Pupils are equal, round, and reactive to light.  Neck: Normal range of motion. Neck supple. No JVD present.  Cardiovascular: Normal rate and regular rhythm.  Exam reveals no gallop and no friction rub.   No murmur heard. Pulmonary/Chest: No respiratory distress. He has no wheezes.  Abdominal: He exhibits no distension and no mass. There is no tenderness. There is no rebound and no guarding.  Musculoskeletal: Normal range of motion.  Neurological: He is unresponsive. GCS eye subscore is 1. GCS verbal subscore is 1. GCS motor subscore is 1.  agonal respirations  Skin: No rash noted. No pallor.  Cool to touch  Psychiatric: He has a normal mood and affect. His behavior is normal.  Nursing note and vitals reviewed.    ED Treatments / Results  Labs (all labs ordered are listed, but only abnormal results are displayed) Labs Reviewed - No data to display  EKG  EKG Interpretation None       Radiology No results found.  Procedures Procedures (including critical care time)  Medications Ordered in ED Medications - No data to display   Initial Impression / Assessment and Plan / ED Course  I have reviewed the triage vital signs and the nursing notes.  Pertinent labs & imaging results that were available during my care of the patient were reviewed by me and considered in my medical decision making (see chart for details).     56 yo M With a chief complaint of unresponsiveness. Patient has a GCS of 3. He is hypotensive into the 70's.  Agonal breathing.  Family requesting comfort care.    Discussed with hospitalist, [place].   The patients results and plan were reviewed and discussed.   Any x-rays performed were independently reviewed by myself.   Differential  diagnosis were considered with the presenting HPI.  Medications - No data to display  Vitals:   02/21/17 2345 01-Mar-2017 0000 03-01-2017 0015 Mar 01, 2017 0034  BP: (!) 75/62   (!) 82/52  Pulse: (!) 137 (!) 135 (!) 131 (!) 127  Resp: (!) 21 19 19 20   Temp: 100.1 F (37.8 C)     TempSrc: Axillary  SpO2: 100% 100% 100% 98%    Final diagnoses:  Shock ([place])  Acute respiratory failure with hypoxia ([place])    Admission/ observation were discussed with the admitting physician, patient and/or family and they are comfortable with the plan.    Final Clinical Impressions(s) / ED Diagnoses   Final diagnoses:  Shock ([place])  Acute respiratory failure with hypoxia [facility])    New Prescriptions Discharge Medication List as of Feb 28, 2017  8:17 AM       Deno Etienne, DO 02-28-17 1521

## 2017-02-21 NOTE — Progress Notes (Signed)
  Progress Note   Date: 02/21/2017   Coding clarification.  Patient Name: Justin [name]        MRN#: [id number]  Pulmonary embolism was present on this admission.      Dron Tanna Furry, MD

## 2017-02-21 NOTE — Telephone Encounter (Signed)
Perfect, thank you so much!

## 2017-02-21 NOTE — ED Notes (Signed)
Patient arrives obtunded with agonal respirations on 100% NRB. Patient is unresponsive-all family and patient's Imam is at bedside

## 2017-02-21 NOTE — ED Notes (Signed)
Bed: WA17 Expected date:  Expected time:  Means of arrival:  Comments: EMS Hypotension

## 2017-02-21 NOTE — Telephone Encounter (Signed)
Hospice called.  Efrain Sella RN wanted to know if Dr. Brigitte Pulse was ok with Dr. Lyman Speller (MD for [facility] teams at [facility]) seeing patient to evaluate and organize care.  Gave permission.

## 2017-02-21 NOTE — ED Triage Notes (Signed)
Pt comes from home via EMS with complaints of unresponsiveness and recurrent issues of hypotension.  Pt is hospice patient and suffering from stage IV liver cancer. BP 70/34 rising to 88/56 after 500 cc bolus.  [place] CBG 89. EDP at bedside now.

## 2017-02-22 DIAGNOSIS — Z515 Encounter for palliative care: Secondary | ICD-10-CM

## 2017-02-22 MED ORDER — ACETAMINOPHEN 650 MG RE SUPP: 650.0000 mg | Freq: Four times a day (QID) | RECTAL | Status: DC | PRN

## 2017-02-22 MED ORDER — POLYVINYL ALCOHOL 1.4 % OP SOLN
1.0000 [drp] | Freq: Four times a day (QID) | OPHTHALMIC | Status: DC | PRN
  Filled 2017-02-22: qty 15

## 2017-02-22 MED ORDER — HALOPERIDOL 0.5 MG PO TABS
0.5000 mg | ORAL_TABLET | ORAL | Status: DC | PRN
  Filled 2017-02-22: qty 0.5

## 2017-02-22 MED ORDER — GLYCOPYRROLATE 0.2 MG/ML IJ SOLN
0.2000 mg | INTRAMUSCULAR | Status: DC | PRN
  Filled 2017-02-22: qty 1

## 2017-02-22 MED ORDER — BIOTENE DRY MOUTH MT LIQD: 15.0000 mL | OROMUCOSAL | Status: DC | PRN

## 2017-02-22 MED ORDER — MORPHINE SULFATE (CONCENTRATE) 10 MG/0.5ML PO SOLN: 5.0000 mg | ORAL | Status: DC | PRN

## 2017-02-22 MED ORDER — ONDANSETRON HCL 4 MG/2ML IJ SOLN: 4.0000 mg | Freq: Four times a day (QID) | INTRAMUSCULAR | Status: DC | PRN

## 2017-02-22 MED ORDER — ONDANSETRON 4 MG PO TBDP: 4.0000 mg | ORAL_TABLET | Freq: Four times a day (QID) | ORAL | Status: DC | PRN

## 2017-02-22 MED ORDER — LORAZEPAM 2 MG/ML PO CONC: 1.0000 mg | ORAL | Status: DC | PRN

## 2017-02-22 MED ORDER — HALOPERIDOL LACTATE 5 MG/ML IJ SOLN: 0.5000 mg | INTRAMUSCULAR | Status: DC | PRN

## 2017-02-22 MED ORDER — ACETAMINOPHEN 325 MG PO TABS: 650.0000 mg | ORAL_TABLET | Freq: Four times a day (QID) | ORAL | Status: DC | PRN

## 2017-02-22 MED ORDER — LORAZEPAM 1 MG PO TABS: 1.0000 mg | ORAL_TABLET | ORAL | Status: DC | PRN

## 2017-02-22 MED ORDER — GLYCOPYRROLATE 1 MG PO TABS
1.0000 mg | ORAL_TABLET | ORAL | Status: DC | PRN
  Filled 2017-02-22: qty 1

## 2017-02-22 MED ORDER — LORAZEPAM 2 MG/ML IJ SOLN: 1.0000 mg | INTRAMUSCULAR | Status: DC | PRN

## 2017-02-22 MED ORDER — HALOPERIDOL LACTATE 2 MG/ML PO CONC
0.5000 mg | ORAL | Status: DC | PRN
  Filled 2017-02-22: qty 0.3

## 2017-02-26 NOTE — Telephone Encounter (Signed)
Encounter opened in error

## 2017-02-27 ENCOUNTER — Ambulatory Visit: Payer: 59 | Admitting: Nurse Practitioner

## 2017-03-09 NOTE — H&P (Signed)
History and Physical    Justin [name] CXK:[id number] DOB: Jun 20, 1961 DOA: 02/21/2017  PCP: Delman Cheadle, MD   Patient coming from: Home  Chief Complaint: Unresponsive   HPI: Justin [name] is a 56 y.o. male with medical history significant for chronic hepatitis B, hepatocellular carcinoma with metastases, and recent diagnosis of PE who now presents to the emergency department with family for the evaluation of unresponsiveness. Patient was discharged from the hospital on 02/20/2017 with home hospice. Unfortunately, his condition had declined significantly in recent weeks despite treatment with chemotherapy. He had been increasingly uncomfortable and in pain and with breathlessness. At time of recent discharge, palliative care consultant, the patient, and the oncologist were in agreement with comfort measures and the patient's family were supportive of this decision. He has continued his expected decline at home and became unresponsive the evening of 02/21/2017. Family had been treating his pain and air hunger with morphine, but were trying to reach the hospice nurse with regard to his unresponsiveness, but were unable to do so, so brought him into the emergency department.  ED Course: Upon arrival to the ED, patient is found to have agonal respirations, heart rate in the 20s, and unresponsiveness. It was advised by the ED physician that we try to keep the patient comfortable as possible as he is actively dying. Family was in agreement with focusing on comfort only and this is what the patient had also expressed the desire for. He will be observed on the medical surgical unit with anxiolytics and analgesia. He is expected to pass in the coming minutes to hours and the family is aware of this.  Review of Systems:  Unable to obtain ROS secondary to pt condition with unresponsiveness.   Past Medical History:  Diagnosis Date  . Cancer ([place])   . Chronic hepatitis B virus infection ([place]) 02/15/2004   Annotation: 02/15/2004:  +Core Ab, +SAg, -SAb, -Be Ag with elevated liver  enzymes HBV DNA via PCR:  <2.3 AFP normal Evaluated by [facility] 07/24/2004--Dr. Patsy [place] Qualifier: Diagnosis of  By: Amil Amen MD, Benjamine Mola    . Diabetes mellitus without complication (HCC)    no meds  . High cholesterol 2006  . Hyperlipidemia   . Hypertension   . Prediabetes 08/31/2016   Lab Results Component Value Date  HGBA1C 6.2 (H) 08/30/2015      Past Surgical History:  Procedure Laterality Date  . EUS N/A 12/26/2016   Procedure: UPPER ENDOSCOPIC ULTRASOUND (EUS) LINEAR;  Surgeon: Milus Banister, MD;  Location: [facility];  Service: Endoscopy;  Laterality: N/A;  . FINE NEEDLE ASPIRATION N/A 12/26/2016   Procedure: FINE NEEDLE ASPIRATION (FNA) LINEAR;  Surgeon: Milus Banister, MD;  Location: [facility];  Service: Endoscopy;  Laterality: N/A;  . IR GENERIC HISTORICAL  12/19/2016   IR RADIOLOGIST EVAL & MGMT 12/19/2016 Arne Cleveland, MD [facility]  . IR GENERIC HISTORICAL  01/09/2017   IR ANGIOGRAM SELECTIVE EACH ADDITIONAL VESSEL 01/09/2017 Arne Cleveland, MD [facility]  . IR GENERIC HISTORICAL  01/09/2017   IR ANGIOGRAM VISCERAL SELECTIVE 01/09/2017 Arne Cleveland, MD [facility]  . IR GENERIC HISTORICAL  01/09/2017   IR ANGIOGRAM SELECTIVE EACH ADDITIONAL VESSEL 01/09/2017 Arne Cleveland, MD [facility]  . IR GENERIC HISTORICAL  01/09/2017   IR ANGIOGRAM VISCERAL SELECTIVE 01/09/2017 Arne Cleveland, MD [facility]  . IR GENERIC HISTORICAL  01/09/2017   IR ANGIOGRAM SELECTIVE EACH ADDITIONAL VESSEL 01/09/2017 Arne Cleveland, MD [facility]  . IR GENERIC HISTORICAL  01/09/2017  IR US GUIDE VASC ACCESS RIGHT 01/09/2017 Arne Cleveland, MD [facility]  . IR GENERIC HISTORICAL  01/09/2017   IR EMBO ARTERIAL NOT HEMORR HEMANG INC GUIDE ROADMAPPING 01/09/2017 Arne Cleveland, MD [facility]  . IR GENERIC HISTORICAL  01/30/2017   IR FLUORO GUIDE PORT INSERTION RIGHT 01/30/2017 Markus Daft, MD [facility]  .  IR GENERIC HISTORICAL  01/30/2017   IR US GUIDE VASC ACCESS RIGHT 01/30/2017 Markus Daft, MD [facility]     reports that he has never smoked. He has never used smokeless tobacco. He reports that he does not drink alcohol or use drugs.  Allergies  Allergen Reactions  . Lisinopril Other (See Comments)    coughing    Family History  Problem Relation Age of Onset  . Heart disease Father   . Hyperlipidemia Father      Prior to Admission medications   Medication Sig Start Date End Date Taking? Authorizing Provider  apixaban (ELIQUIS) 5 MG TABS tablet Take 1 tablet (5 mg total) by mouth 2 (two) times daily. 02/21/17   Debbe Odea, MD  bisacodyl (DULCOLAX) 5 MG EC tablet Take 5 mg by mouth daily as needed for moderate constipation.    Historical Provider, MD  calcium carbonate (TUMS - DOSED IN MG ELEMENTAL CALCIUM) 500 MG chewable tablet Chew 1 tablet by mouth daily as needed for indigestion or heartburn.    Historical Provider, MD  famotidine (PEPCID) 20 MG tablet Take 1 tablet (20 mg total) by mouth 2 (two) times daily. 02/14/17   Debbe Odea, MD  feeding supplement (BOOST / RESOURCE BREEZE) LIQD Take 1 Container by mouth daily. 02/14/17   Debbe Odea, MD  feeding supplement, ENSURE ENLIVE, (ENSURE ENLIVE) LIQD Take 237 mLs by mouth 3 (three) times daily between meals. 02/14/17   Debbe Odea, MD  ferrous sulfate 325 (65 FE) MG tablet Take 325 mg by mouth 2 (two) times daily with a meal.    Historical Provider, MD  lidocaine-prilocaine (EMLA) cream Apply to port site one hour prior to use. Do not rub in, cover with plastic. 01/31/17   Ladell Pier, MD  morphine (MS CONTIN) 15 MG 12 hr tablet Take 1 tablet (15 mg total) by mouth every 12 (twelve) hours. 02/20/17   Dron Tanna Furry, MD  Morphine Sulfate (MORPHINE CONCENTRATE) 10 MG/0.5ML SOLN concentrated solution Take 0.5 mLs (10 mg total) by mouth every 4 (four) hours as needed for moderate pain or shortness of breath. 02/20/17   Dron Tanna Furry, MD  naproxen sodium (ANAPROX) 220 MG tablet Take 220 mg by mouth 2 (two) times daily as needed (for pain/fever/headache.).    Historical Provider, MD  ondansetron (ZOFRAN) 8 MG tablet Take 1 tablet (8 mg total) by mouth every 8 (eight) hours as needed for nausea or vomiting. 01/23/17   Ladell Pier, MD  oxyCODONE (ROXICODONE) 5 MG immediate release tablet Take 1 tablet (5 mg total) by mouth every 6 (six) hours as needed for severe pain. 02/20/17   Dron Tanna Furry, MD  polyethylene glycol [facility] / Floria Raveling) packet Take 17 g by mouth 2 (two) times daily. 02/20/17   Dron Tanna Furry, MD  potassium chloride SA (K-DUR,KLOR-CON) 20 MEQ tablet Take 1 tablet (20 mEq total) by mouth daily. 01/23/17   Ladell Pier, MD  predniSONE (DELTASONE) 50 MG tablet Take 1 tablet (50 mg total) by mouth daily with breakfast. 02/20/17   Dron Tanna Furry, MD  prochlorperazine (COMPAZINE) 10 MG tablet Take 1  tablet (10 mg total) by mouth every 6 (six) hours as needed for nausea or vomiting. 01/23/17   Ladell Pier, MD  zolpidem (AMBIEN) 5 MG tablet Take 1 tablet (5 mg total) by mouth at bedtime as needed for sleep. 02/20/17   Dron Tanna Furry, MD    Physical Exam: Vitals:   02/21/17 2345 02/26/17 0000 02/26/17 0015 2017/02/26 0034  BP: (!) 75/62   (!) 82/52  Pulse: (!) 137 (!) 135 (!) 131 (!) 127  Resp: (!) 21 19 19 20   Temp: 100.1 F (37.8 C)     TempSrc: Axillary     SpO2: 100% 100% 100% 98%      Constitutional: agonal respirations, not responding to noxious stimuli.  Eyes: PERTLA, lids and conjunctivae normal ENMT: Mucous membranes are dry. Posterior pharynx clear of any exudate or lesions.   Neck: normal, supple, no thyromegaly. Mass in left neck. Respiratory:  Normal respiratory effort. No accessory muscle use. Agonal breathing. Cardiovascular: Rate ~30 and regular. Neck veins distended. Abdomen: Distended. Soft.   Musculoskeletal: right forearm mass. Normal muscle tone.    Skin: no significant rashes, lesions, ulcers. Warm, dry, well-perfused. Neurologic: Comatose.   Psychiatric: unable to assess given clinical situation.     Labs on Admission: I have personally reviewed following labs and imaging studies  CBC:  Recent Labs Lab 02/15/17 1610 02/16/17 0523 02/17/17 0235 02/18/17 0257 02/19/17 0259  WBC 26.5* 26.4* 32.3* 34.3* 35.3*  NEUTROABS 22.0* 21.7*  --   --   --   HGB 9.0* 8.3* 9.0* 8.9* 8.7*  HCT 27.8* 25.8* 28.3* 28.0* 27.2*  MCV 77.7* 77.5* 78.4 77.8* 77.9*  PLT 296 308 370 371 976   Basic Metabolic Panel:  Recent Labs Lab 02/16/17 0523 02/17/17 0235 02/18/17 0257 02/18/17 1430 02/19/17 0259  NA 129* 128* 124* 123* 124*  K 3.1* 4.0 3.7 3.8 3.9  CL 100* 97* 92* 91* 91*  CO2 22 22 22  21* 22  GLUCOSE 125* 135* 129* 139* 114*  BUN 7 6 7 6 8   CREATININE 0.58* 0.71 0.57* 0.56* 0.63  CALCIUM 8.1* 8.5* 9.1 9.1 9.0   GFR: Estimated Creatinine Clearance: 98.9 mL/min (by C-G formula based on SCr of 0.63 mg/dL). Liver Function Tests: No results for input(s): AST, ALT, ALKPHOS, BILITOT, PROT, ALBUMIN in the last 168 hours. No results for input(s): LIPASE, AMYLASE in the last 168 hours. No results for input(s): AMMONIA in the last 168 hours. Coagulation Profile:  Recent Labs Lab 02/15/17 0533  INR 1.86   Cardiac Enzymes: No results for input(s): CKTOTAL, CKMB, CKMBINDEX, TROPONINI in the last 168 hours. BNP (last 3 results) No results for input(s): PROBNP in the last 8760 hours. HbA1C: No results for input(s): HGBA1C in the last 72 hours. CBG:  Recent Labs Lab 02/17/17 1612 02/17/17 2029 02/18/17 0729 02/19/17 0737 02/20/17 0736  GLUCAP 150* 136* 115* 137* 161*   Lipid Profile: No results for input(s): CHOL, HDL, LDLCALC, TRIG, CHOLHDL, LDLDIRECT in the last 72 hours. Thyroid Function Tests: No results for input(s): TSH, T4TOTAL, FREET4, T3FREE, THYROIDAB in the last 72 hours. Anemia Panel: No results for  input(s): VITAMINB12, FOLATE, FERRITIN, TIBC, IRON, RETICCTPCT in the last 72 hours. Urine analysis:    Component Value Date/Time   COLORURINE YELLOW 02/15/2017 0139   APPEARANCEUR CLEAR 02/15/2017 0139   LABSPEC 1.012 02/15/2017 0139   PHURINE 6.0 02/15/2017 0139   GLUCOSEU NEGATIVE 02/15/2017 0139   HGBUR NEGATIVE 02/15/2017 0139   HGBUR negative 01/18/2011 1006  BILIRUBINUR NEGATIVE 02/15/2017 0139   BILIRUBINUR small (A) 12/07/2016 0855   KETONESUR 20 (A) 02/15/2017 0139   PROTEINUR NEGATIVE 02/15/2017 0139   UROBILINOGEN 1.0 12/07/2016 0855   UROBILINOGEN 0.2 01/18/2011 1006   NITRITE NEGATIVE 02/15/2017 0139   LEUKOCYTESUR NEGATIVE 02/15/2017 0139   Sepsis Labs: @LABRCNTIP (procalcitonin:4,lacticidven:4) ) Recent Results (from the past 240 hour(s))  MRSA PCR Screening     Status: None   Collection Time: 02/12/17  4:51 AM  Result Value Ref Range Status   MRSA by PCR NEGATIVE NEGATIVE Final    Comment:        The GeneXpert MRSA Assay (FDA approved for NASAL specimens only), is one component of a comprehensive MRSA colonization surveillance program. It is not intended to diagnose MRSA infection nor to guide or monitor treatment for MRSA infections.   Urine culture     Status: None   Collection Time: 02/15/17  1:39 AM  Result Value Ref Range Status   Specimen Description URINE, RANDOM  Final   Special Requests NONE  Final   Culture   Final    NO GROWTH Performed at [facility], [address] [place], [place] [address]    Report Status 02/16/2017 FINAL  Final  Culture, blood (x 2)     Status: None   Collection Time: 02/15/17  5:38 AM  Result Value Ref Range Status   Specimen Description BLOOD RIGHT ANTECUBITAL  Final   Special Requests IN PEDIATRIC BOTTLE 4CC  Final   Culture   Final    NO GROWTH 5 DAYS Performed at [facility], [place] [address] [place], [place] [address]    Report Status 02/20/2017 FINAL  Final  Culture, blood (x 2)      Status: None   Collection Time: 02/15/17  5:40 AM  Result Value Ref Range Status   Specimen Description BLOOD LEFT ANTECUBITAL  Final   Special Requests BOTTLES DRAWN AEROBIC ONLY 5CC  Final   Culture   Final    NO GROWTH 5 DAYS Performed at [facility], [place] [address], [place], [place] [address]    Report Status 02/20/2017 FINAL  Final     Radiological Exams on Admission: No results found.  EKG: Not performed, will obtain as appropriate.   Assessment/Plan  1. End of life care  - Pt is on hospice with hepatocelluar carcinoma with metastases  - He became unresponsive several hours prior to arrival, family were unable to reach hospice nurse, so brought him to ED  - He is agonal breathing and has HR in 20s on arrival - Family is agreement with continuing comfort measures here in hospital and is aware that he is expected to pass in minutes to hours  - Family and Imam at bedside   DVT prophylaxis: None Code Status: DNR Family Communication: Discussed with wife and daughter at bedside Disposition Plan: Observe on med-srug Consults called: None Admission status: Observation   Vianne Bulls, MD [facility] Pager [phone]  If 7PM-7AM, please contact night-coverage [email or web address] Password [facility]  February 25, 2017, 3:43 AM

## 2017-03-09 NOTE — Progress Notes (Addendum)
Received this patient [name], Justin to room 1342 for End of Life, comfort care. Patient is DNR. Patient is  unresponsive to both verbal and painful stimuli and  is accompany by a large family group. Will continue to monitor patient needs for comfort and support for family.

## 2017-03-09 NOTE — Discharge Summary (Signed)
Death Summary  Justin [name] HDQ:[id number] DOB: 1960-12-12 DOA: 2017-03-06  PCP: Delman Cheadle, MD  Admit date: 03/06/17 Date of Death: 03/07/2017 Time of Death: 03:00 Notification: Delman Cheadle, MD notified of death of March 07, 2017   History of present illness/Hospital course:  Justin [name] is a 56 y.o. male with a history of Chronic hepatitis B, hepatocellular carcinoma with metastases, and recent diagnosis of pulmonary emboli, presenting to the emergency department with unresponsiveness. Patient was discharged from the hospital the day prior with home hospice, continued his decline as expected at home, and became unresponsive several hours prior to his arrival. Family had attempted to contact their hospice nurse, but were unable to, and brought him into the emergency department. Patient was bradycardic in the 20s and with agonal respirations on his arrival, not responsive to noxious stimuli. Family was in agreement with continuing comfort focused care, understanding that he is expected to pass 7 minutes to hours. Additional family members were called to the hospital as well as the patient's Imam. He was transported up to the medical-surgical unit with analgesics and anxiolytics. He remained unresponsive, breathing eventually slowed and stopped, and no heart tones could be appreciated. Patient was pronounced deceased at 19 AM with family and Imam at the bedside.   Final Diagnoses:  1.   Hepatocellular carcinoma, metastatic    The results of significant diagnostics from this hospitalization (including imaging, microbiology, ancillary and laboratory) are listed below for reference.    Significant Diagnostic Studies: Ct Angio Chest Pe W/cm &/or Wo Cm  Result Date: 02/12/2017 CLINICAL DATA:  Initial evaluation for acute dyspnea on exertion, tachycardia. History of hepatocellular carcinoma. EXAM: CT ANGIOGRAPHY CHEST WITH CONTRAST TECHNIQUE: Multidetector CT imaging of the chest was performed using the  standard protocol during bolus administration of intravenous contrast. Multiplanar CT image reconstructions and MIPs were obtained to evaluate the vascular anatomy. CONTRAST:  100 cc of Isovue 370. COMPARISON:  Prior CT from 12/17/2016. FINDINGS: Cardiovascular: Intrathoracic aorta of normal caliber without acute abnormality. Visualized great vessels within normal limits. Right-sided Port-A-Cath in place. Heart size within normal limits. No pericardial effusion. Pulmonary arterial tree adequately opacified for evaluation. Main pulmonary artery within normal limits for size measuring 2.9 cm in diameter. There is scattered nonocclusive thrombus involving segmental pulmonary artery supplying the right lower lobe (series 6, image 133). Additional scattered nonocclusive thrombus within segmental lingular branches (series 6, image 142). Possible additional minimal clot within segmental left upper lobe branches (series 10, image 61) no other significant clot identified. RV to LV ratio mildly elevated at 1.0. Mild straightening of the intraventricular septum. Mediastinum/Nodes: Few scattered subcentimeter hypodense nodules noted within the thyroid, of doubtful significance. Bulky right paratracheal adenopathy measuring 3.5 x 3.0 cm (series 6, image 90). Additional bulky precarinal nodes measure approximately 2.2 x 4.9 cm. Large subcarinal nodal conglomerate measures 4.4 x 7.5 x 12.0 cm. This extends from the subcarinal region inferiorly towards the GE junction. Secondary mild mass effect on the left atrium which is flattened posteriorly. This extends inferiorly towards the GE junction along the anterior margin of the aorta. Scattered periaortic nodes present posteriorly measuring up to 1 cm (series 6, image 207). Bulky right hilar node measures 2.3 x 2.4 cm (series 6, image 135). Lungs/Pleura: Tracheobronchial tree is patent. Right hemidiaphragm is elevated with associated right basilar atelectasis. Irregular lobular  bulging of the right hemidiaphragm from the subjacent liver (series 11, image 65). 4 mm right lower lobe nodule (series 7, image 43). Additional 3 mm right upper lobe nodule (  series 7, image 44). 6 mm subpleural left upper lobe nodule (series 7, image 28). Additional 4 mm subpleural left upper lobe nodule (series 7, image 24). Few additional scattered right middle lobe nodules measure up to 8 mm (series 5, image 55). No focal infiltrates.  No pleural effusion.  No pneumothorax. Upper Abdomen: Large heterogeneous mass involving the right hepatic lobe again seen, compatible with history of hepatocellular carcinoma. Although direct measurements are somewhat difficult due to the infiltrative nature of this lesion, this appears increased in size from previous. Remainder the visualized upper abdomen otherwise unremarkable. Musculoskeletal: No acute osseous abnormality. No worrisome lytic or blastic osseous lesions. Review of the MIP images confirms the above findings. IMPRESSION: 1. Scattered acute pulmonary emboli involving bilateral segmental pulmonary arteries as above, with greatest involvement within the right lower lobe. Positive for acute PE with CT evidence of right heart strain (RV/LV Ratio = 1.0) consistent with at least submassive (intermediate risk) PE. The presence of right heart strain has been associated with an increased risk of morbidity and mortality. Please activate Code PE by paging [phone]. 2. Large infiltrative heterogeneous right hepatic lobe mass, compatible with history of hepatocellular carcinoma, suspected to be increased in size from previous. 3. Extensive bulky mediastinal and right hilar adenopathy as above, compatible with nodal metastases. 4. Scattered bilateral pulmonary nodules as above, several of which are new from previous, and may also reflect metastatic disease. Attention at follow-up recommended. Critical Value/emergent results were called by telephone at the time of  interpretation on 02/12/2017 at 3:04 am to Dr. Rolland Porter , who verbally acknowledged these results. Electronically Signed   By: Jeannine Boga M.D.   On: 02/12/2017 03:08   Mr Lumbar Spine Wo Contrast  Result Date: 02/04/2017 CLINICAL DATA:  Hepatocellular carcinoma.  Radicular low back pain. EXAM: MRI LUMBAR SPINE WITHOUT CONTRAST TECHNIQUE: Multiplanar, multisequence MR imaging of the lumbar spine was performed. No intravenous contrast was administered. COMPARISON:  CT the abdomen and pelvis 01/23/2017 FINDINGS: Segmentation: 5 non rib-bearing lumbar type vertebral bodies are present. Alignment:  AP alignment is anatomic. Vertebrae: Marrow signal is diffusely compressed, compatible with chronic anemia Conus medullaris: Extends to the L1 level and appears normal. Paraspinal and other soft tissues: Limited imaging of the abdomen demonstrates a benign cyst in the left kidney measuring 15 mm. The treated right hepatic mass is not well seen. Disc levels: L1-2:  Negative. L2-3: A broad-based disc protrusion is present. There is a central annular tear. Mild subarticular narrowing is worse on the left. There is no significant foraminal disease. L3-4:  Negative. L4-5: A broad-based disc protrusion is asymmetric to the right. Mild subarticular narrowing is present on the right. Moderate right and mild left foraminal stenosis is present. L5-S1: A leftward disc protrusion is present. Asymmetric left-sided facet hypertrophy is noted. This results in moderate left subarticular and foraminal stenosis. The right foramen is patent. IMPRESSION: 1. Broad-based disc protrusion and central annular tear at L2-3 with mild subarticular narrowing, worse on the left. 2. Far right lateral disc protrusion at L4-5 with mild right-sided subarticular narrowing, moderate right, and mild left foraminal stenosis. 3. Leftward disc protrusion at L5-S1 with moderate left subarticular and foraminal narrowing. Electronically Signed   By:  San Morelle M.D.   On: 02/04/2017 13:50   Ct Abdomen Pelvis W Contrast  Result Date: 01/23/2017 CLINICAL DATA:  Large right hepatic lobe hepatocellular carcinoma, with planned radio embolization. Cough. Abdominal pain and elevated bilirubin EXAM: CT ABDOMEN AND PELVIS WITH  CONTRAST TECHNIQUE: Multidetector CT imaging of the abdomen and pelvis was performed using the standard protocol following bolus administration of intravenous contrast. CONTRAST:  141mL ISOVUE-300 IOPAMIDOL (ISOVUE-300) INJECTION 61% COMPARISON:  Multiple exams, including 12/12/2016 MRI and CT of 12/17/2016 FINDINGS: Lower chest: Abnormal right infrahilar low-density lymph node 1.4 cm in short axis on image 6/2. Extensive subcarinal and paraesophageal low-density mass significantly increased over the past 5 weeks, measuring up to 3.3 by 7.0 cm on image 6/2. A distal paraesophageal node measures 2.8 cm in short axis on image 16/2. Possible invasion of the large right hepatocellular carcinoma through the right hemidiaphragm on images 9/2. Hepatobiliary: The mass arising from the right hepatic lobe measures 17.0 by 13.8 by 12.6 cm (volume = 1550 cm^3), a significant enlargement compared to prior. This appears to extend across into the left hepatic lobe adjacent to the IVC and displaces and flattens the intrahepatic IVC significantly. Pancreas: There is a new 8 mm focus of hypoenhancement in the pancreatic head near the junction with the uncinate process on image 42/2, not readily visible on the prior exam. Early metastatic lesion not excluded. Spleen: Unremarkable Adrenals/Urinary Tract: New metastatic lesions to the left adrenal gland, largest 1.8 by 2.9 cm. New metastatic lesions to the right adrenal gland, including a 2.2 by 1.8 cm posterior nodule and a 2.5 by 1.2 cm lateral limb nodule. There is a new a hypodense nodular lesion in the right mid kidney measuring 9 mm in diameter on image 42/2, although this resembles a cyst the fact  that is new compared to an exam from 5 weeks ago raises concern for possible metastatic lesion. There is also a new small right perirenal nodule measuring about 10 mm in diameter on image 44/2. New anterior right perirenal nodule 1.0 by 0.8 cm on image 36/2. Stomach/Bowel: Unremarkable Vascular/Lymphatic: Coils near the origin of the gastroduodenal artery, no visualized patency of the GDA observed. Porta hepatis and peripancreatic adenopathy an index node adjacent to the upper IVC measures 1.7 cm in short axis on image 38/2. Reproductive: Unremarkable Other: New omental nodule 1.2 by 0.8 cm, image 57/2. New anterior abdominal wall subcutaneous nodule, 1.6 by 1.2 cm on image 70/2. New left anterior omental nodule 0.6 cm in diameter on image 51/2. New left anterior abdominal wall subcutaneous nodule, 1.7 by 1.1 cm on image 85/2. New anterior subcutaneous nodule in the lower pelvis eccentric to the right, 1.2 by 0.9 cm on image 91/2. Musculoskeletal: Sacralized L5. Mild left foraminal stenosis at L4-5 due to spurring. IMPRESSION: 1. Rapidly progressive metastatic disease and enlarging right hepatic lobe mass. The mass now measures 1550 cubic cm with new metastatic lesions to the right infrahilar region, subcarinal and para sat esophageal regions, right perirenal space, anterior subcutaneous tissues, omentum, porta hepatis, pancreatic head, and right kidney. The tumor may be invading through the right hemidiaphragm. 2. Coils near the origin of the gastroduodenal artery without patency of the GDA observed. Electronically Signed   By: Van Clines M.D.   On: 01/23/2017 11:12   Ir US Guide Vasc Access Right  Result Date: 01/30/2017 INDICATION: 56 year old with hepatocellular carcinoma. Port-A-Cath needed for chemotherapy. EXAM: FLUOROSCOPIC AND ULTRASOUND GUIDED PLACEMENT OF A SUBCUTANEOUS PORT COMPARISON:  None. MEDICATIONS: Ancef 2 g; The antibiotic was administered within an appropriate time interval prior to  skin puncture. ANESTHESIA/SEDATION: Versed 4.0 mg IV; Fentanyl 125 mcg IV; Moderate Sedation Time:  32 minutes The patient was continuously monitored during the procedure by the interventional radiology nurse under  my direct supervision. FLUOROSCOPY TIME:  36 seconds, 5 mGy COMPLICATIONS: None immediate. PROCEDURE: The procedure, risks, benefits, and alternatives were explained to the patient. Questions regarding the procedure were encouraged and answered. The patient understands and consents to the procedure. Patient was placed supine on the interventional table. Ultrasound confirmed a patent right internal jugular vein. The right chest and neck were cleaned with a skin antiseptic and a sterile drape was placed. Maximal barrier sterile technique was utilized including caps, mask, sterile gowns, sterile gloves, sterile drape, hand hygiene and skin antiseptic. The right neck was anesthetized with 1% lidocaine. Small incision was made in the right neck with a blade. Micropuncture set was placed in the right internal jugular vein with ultrasound guidance. The micropuncture wire was used for measurement purposes. The right chest was anesthetized with 1% lidocaine with epinephrine. #15 blade was used to make an incision and a subcutaneous port pocket was formed. [place] was assembled. Subcutaneous tunnel was formed with a stiff tunneling device. The port catheter was brought through the subcutaneous tunnel. The port was placed in the subcutaneous pocket. The micropuncture set was exchanged for a peel-away sheath. The catheter was placed through the peel-away sheath and the tip was positioned at the superior cavoatrial junction. Catheter placement was confirmed with fluoroscopy. The port was accessed and flushed with heparinized saline. The port pocket was closed using two layers of absorbable sutures and Dermabond. The vein skin site was closed using a single layer of absorbable suture and Dermabond. Sterile  dressings were applied. Patient tolerated the procedure well without an immediate complication. Ultrasound and fluoroscopic images were taken and saved for this procedure. IMPRESSION: Placement of a subcutaneous port device. Electronically Signed   By: Markus Daft M.D.   On: 01/30/2017 15:52   Dg Chest Port 1 View  Result Date: 02/19/2017 CLINICAL DATA:  Shortness of breath EXAM: PORTABLE CHEST 1 VIEW COMPARISON:  Portable chest x-ray of February 15, 2017 FINDINGS: The patient is positioned in a lordotic matter which accentuates hypoinflation. The right hemidiaphragm remains higher than the left. No discrete infiltrate is observed. No definite cardiomegaly or pulmonary vascular congestion. There is mild gaseous distention of the stomach. The power port catheter tip projects over the proximal to mid SVC. IMPRESSION: Limited study due to patient positioning. Significant right-sided hypoinflation due to the elevated hemidiaphragm, stable. No definite acute pneumonia nor CHF. Electronically Signed   By: David  Martinique M.D.   On: 02/19/2017 11:48   Dg Chest Port 1 View  Result Date: 02/15/2017 CLINICAL DATA:  Sepsis.  Hemoptysis. EXAM: PORTABLE CHEST 1 VIEW COMPARISON:  02/12/2017 FINDINGS: Right jugular port terminates in the low SVC. Stable elevation of the right hemidiaphragm. No airspace consolidation. No large effusion. Normal pulmonary vascular IMPRESSION: No acute cardiopulmonary findings. Electronically Signed   By: Andreas Newport M.D.   On: 02/15/2017 06:02   Dg Chest Port 1 View  Result Date: 02/12/2017 CLINICAL DATA:  Fever, nausea and weakness tonight. Patient on chemotherapy for liver cancer. EXAM: PORTABLE CHEST 1 VIEW COMPARISON:  CXR from 02/27/2004 and addendum CT abdomen from 12/17/2016 FINDINGS: Elevated appearance of the right hemidiaphragm unchanged from prior CT of the abdomen likely secondary to an enlarged right hepatic lobe mass seen on CT beneath the right hemidiaphragm. This is not  believed to due to a subpulmonic effusion. Left lung is clear. There is mild interstitial edema. Port catheter tip is seen in the distal SVC. No pulmonary consolidation. No pneumothorax. The  nodular densities seen prior CT within the right middle lobe upper not radiographically apparent. However there may be a 6 mm nodular density in the left upper lobe. No suspicious osseous lesions. IMPRESSION: Mild interstitial edema. Possible 6 mm nodular density in the left upper lobe versus a summation vascular shadows. Elevated right hemidiaphragm likely from known underlying hepatic mass. Electronically Signed   By: Ashley Royalty M.D.   On: 02/12/2017 01:12   Ir Fluoro Guide Port Insertion Right  Result Date: 01/30/2017 INDICATION: 56 year old with hepatocellular carcinoma. Port-A-Cath needed for chemotherapy. EXAM: FLUOROSCOPIC AND ULTRASOUND GUIDED PLACEMENT OF A SUBCUTANEOUS PORT COMPARISON:  None. MEDICATIONS: Ancef 2 g; The antibiotic was administered within an appropriate time interval prior to skin puncture. ANESTHESIA/SEDATION: Versed 4.0 mg IV; Fentanyl 125 mcg IV; Moderate Sedation Time:  32 minutes The patient was continuously monitored during the procedure by the interventional radiology nurse under my direct supervision. FLUOROSCOPY TIME:  36 seconds, 5 mGy COMPLICATIONS: None immediate. PROCEDURE: The procedure, risks, benefits, and alternatives were explained to the patient. Questions regarding the procedure were encouraged and answered. The patient understands and consents to the procedure. Patient was placed supine on the interventional table. Ultrasound confirmed a patent right internal jugular vein. The right chest and neck were cleaned with a skin antiseptic and a sterile drape was placed. Maximal barrier sterile technique was utilized including caps, mask, sterile gowns, sterile gloves, sterile drape, hand hygiene and skin antiseptic. The right neck was anesthetized with 1% lidocaine. Small incision  was made in the right neck with a blade. Micropuncture set was placed in the right internal jugular vein with ultrasound guidance. The micropuncture wire was used for measurement purposes. The right chest was anesthetized with 1% lidocaine with epinephrine. #15 blade was used to make an incision and a subcutaneous port pocket was formed. [place] was assembled. Subcutaneous tunnel was formed with a stiff tunneling device. The port catheter was brought through the subcutaneous tunnel. The port was placed in the subcutaneous pocket. The micropuncture set was exchanged for a peel-away sheath. The catheter was placed through the peel-away sheath and the tip was positioned at the superior cavoatrial junction. Catheter placement was confirmed with fluoroscopy. The port was accessed and flushed with heparinized saline. The port pocket was closed using two layers of absorbable sutures and Dermabond. The vein skin site was closed using a single layer of absorbable suture and Dermabond. Sterile dressings were applied. Patient tolerated the procedure well without an immediate complication. Ultrasound and fluoroscopic images were taken and saved for this procedure. IMPRESSION: Placement of a subcutaneous port device. Electronically Signed   By: Markus Daft M.D.   On: 01/30/2017 15:52    Microbiology: Recent Results (from the past 240 hour(s))  MRSA PCR Screening     Status: None   Collection Time: 02/12/17  4:51 AM  Result Value Ref Range Status   MRSA by PCR NEGATIVE NEGATIVE Final    Comment:        The GeneXpert MRSA Assay (FDA approved for NASAL specimens only), is one component of a comprehensive MRSA colonization surveillance program. It is not intended to diagnose MRSA infection nor to guide or monitor treatment for MRSA infections.   Urine culture     Status: None   Collection Time: 02/15/17  1:39 AM  Result Value Ref Range Status   Specimen Description URINE, RANDOM  Final   Special  Requests NONE  Final   Culture   Final  NO GROWTH Performed at [facility], [place] [address], [place], [place] [address]    Report Status 02/16/2017 FINAL  Final  Culture, blood (x 2)     Status: None   Collection Time: 02/15/17  5:38 AM  Result Value Ref Range Status   Specimen Description BLOOD RIGHT ANTECUBITAL  Final   Special Requests IN PEDIATRIC BOTTLE 4CC  Final   Culture   Final    NO GROWTH 5 DAYS Performed at [facility], [place] [address], [place], [place] [address]    Report Status 02/20/2017 FINAL  Final  Culture, blood (x 2)     Status: None   Collection Time: 02/15/17  5:40 AM  Result Value Ref Range Status   Specimen Description BLOOD LEFT ANTECUBITAL  Final   Special Requests BOTTLES DRAWN AEROBIC ONLY 5CC  Final   Culture   Final    NO GROWTH 5 DAYS Performed at [facility], [place] [address] [place], [place] [address]    Report Status 02/20/2017 FINAL  Final     Labs: Basic Metabolic Panel:  Recent Labs Lab 02/16/17 0523 02/17/17 0235 02/18/17 0257 02/18/17 1430 02/19/17 0259  NA 129* 128* 124* 123* 124*  K 3.1* 4.0 3.7 3.8 3.9  CL 100* 97* 92* 91* 91*  CO2 22 22 22  21* 22  GLUCOSE 125* 135* 129* 139* 114*  BUN 7 6 7 6 8   CREATININE 0.58* 0.71 0.57* 0.56* 0.63  CALCIUM 8.1* 8.5* 9.1 9.1 9.0   Liver Function Tests: No results for input(s): AST, ALT, ALKPHOS, BILITOT, PROT, ALBUMIN in the last 168 hours. No results for input(s): LIPASE, AMYLASE in the last 168 hours. No results for input(s): AMMONIA in the last 168 hours. CBC:  Recent Labs Lab 02/15/17 1610 02/16/17 0523 02/17/17 0235 02/18/17 0257 02/19/17 0259  WBC 26.5* 26.4* 32.3* 34.3* 35.3*  NEUTROABS 22.0* 21.7*  --   --   --   HGB 9.0* 8.3* 9.0* 8.9* 8.7*  HCT 27.8* 25.8* 28.3* 28.0* 27.2*  MCV 77.7* 77.5* 78.4 77.8* 77.9*  PLT 296 308 370 371 323   Cardiac Enzymes: No results for input(s): CKTOTAL, CKMB, CKMBINDEX, TROPONINI in the last 168  hours. D-Dimer No results for input(s): DDIMER in the last 72 hours. BNP: Invalid input(s): POCBNP CBG:  Recent Labs Lab 02/17/17 1612 02/17/17 2029 02/18/17 0729 02/19/17 0737 02/20/17 0736  GLUCAP 150* 136* 115* 137* 161*   Anemia work up No results for input(s): VITAMINB12, FOLATE, FERRITIN, TIBC, IRON, RETICCTPCT in the last 72 hours. Urinalysis    Component Value Date/Time   COLORURINE YELLOW 02/15/2017 0139   APPEARANCEUR CLEAR 02/15/2017 0139   LABSPEC 1.012 02/15/2017 0139   PHURINE 6.0 02/15/2017 0139   GLUCOSEU NEGATIVE 02/15/2017 0139   HGBUR NEGATIVE 02/15/2017 0139   HGBUR negative 01/18/2011 1006   BILIRUBINUR NEGATIVE 02/15/2017 0139   BILIRUBINUR small (A) 12/07/2016 0855   KETONESUR 20 (A) 02/15/2017 0139   PROTEINUR NEGATIVE 02/15/2017 0139   UROBILINOGEN 1.0 12/07/2016 0855   UROBILINOGEN 0.2 01/18/2011 1006   NITRITE NEGATIVE 02/15/2017 0139   LEUKOCYTESUR NEGATIVE 02/15/2017 0139   Sepsis Labs Invalid input(s): PROCALCITONIN,  WBC,  LACTICIDVEN     SIGNED:  Vianne Bulls, MD  [facility] 03/14/2017, 4:14 AM Pager   If 7PM-7AM, please contact night-coverage [email or web address] Password [email or web address]

## 2017-03-09 NOTE — ED Notes (Signed)
Pt seems to be resting comfortably.  Multiple family members at bedside tearful.

## 2017-03-09 NOTE — Progress Notes (Signed)
Pronounced this patient deceased at 0300 this morning. Justin [name] is of Muslim faith and will honor his family wishes. Patient's Imam is at bedside.

## 2017-03-09 NOTE — ED Notes (Signed)
Report to [facility] RN-ready for patient

## 2017-03-09 NOTE — ED Notes (Signed)
Patient placed on comfort care monitor

## 2017-03-09 NOTE — Progress Notes (Signed)
Timothy s. Opyd MD in to speak with spouse concerning End of Life plan of care.

## 2017-03-09 DEATH — deceased

## 2017-03-21 ENCOUNTER — Other Ambulatory Visit: Payer: Self-pay | Admitting: Nurse Practitioner

## 2017-05-12 NOTE — Addendum Note (Signed)
Addendum  created 05/12/17 1011 by Ruben Mahler, MD   Sign clinical note    

## 2017-05-12 NOTE — Anesthesia Postprocedure Evaluation (Signed)
Anesthesia Post Note  Patient: Justin [name]  Procedure(s) Performed: Procedure(s) (LRB): UPPER ENDOSCOPIC ULTRASOUND (EUS) LINEAR (N/A) FINE NEEDLE ASPIRATION (FNA) LINEAR (N/A)     Anesthesia Post Evaluation  Last Vitals:  Vitals:   12/26/16 1000 12/26/16 1007  BP: 130/90   Pulse: 87 87  Resp: 19 (!) 25  Temp:      Last Pain:  Vitals:   12/31/16 1024  TempSrc:   PainSc: 0-No pain                 Matasha Smigelski S

## 2018-05-04 IMAGING — CT CT ABD-PELV W/ CM
2 of 5 series · 14 of 46 positions shown, 16 images · IV contrast (iopamidol)
Comparison: Multiple exams, including 12/12/2016 MRI and CT of
12/17/2016

CLINICAL DATA: Large right hepatic lobe hepatocellular carcinoma,
with planned radio embolization. Cough. Abdominal pain and elevated
bilirubin

EXAM:
CT ABDOMEN AND PELVIS WITH CONTRAST
TECHNIQUE: Multidetector CT imaging of the abdomen and pelvis was performed
using the standard protocol following bolus administration of
intravenous contrast.
CONTRAST:  100mL [id number] IOPAMIDOL (HZVTXO-2JJ) INJECTION 61%

[Series 2: axial st · axial · 0.77mm/px · z∈[+949,+1424]mm · 11 of 111 slices shown, 13 images]
[im 8/111  soft-tissue]
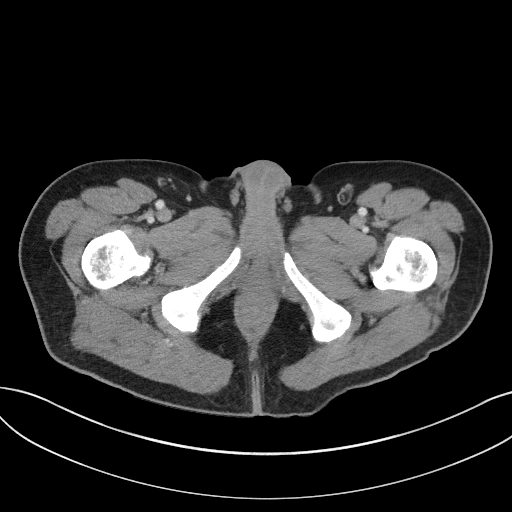
[im 8/111  bone]
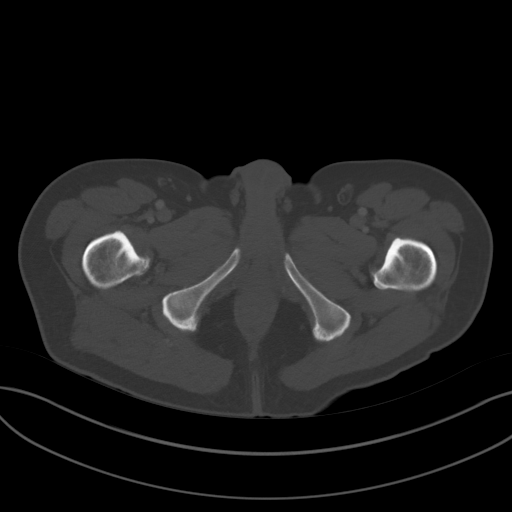
[im 16/111  soft-tissue]
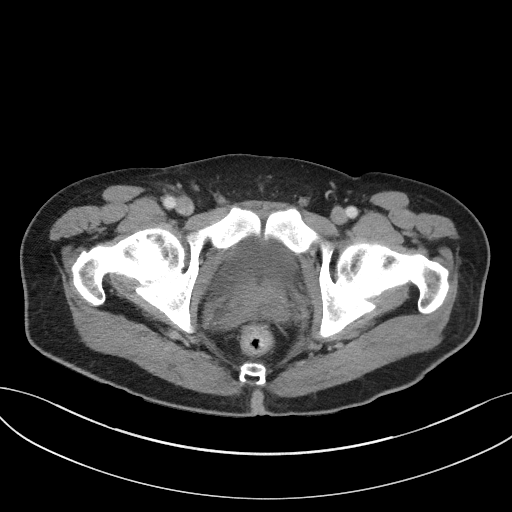
[im 24/111  soft-tissue]
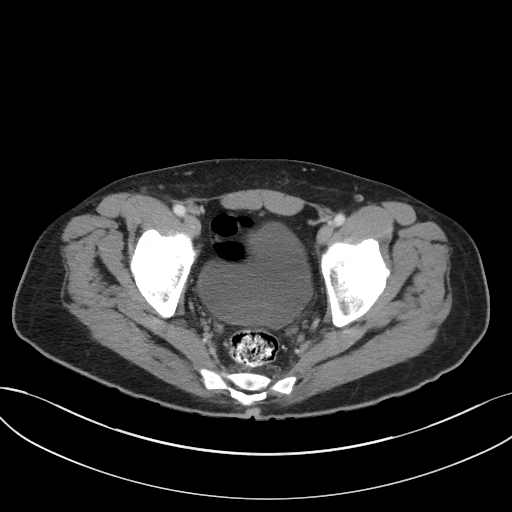
[im 40/111  soft-tissue]
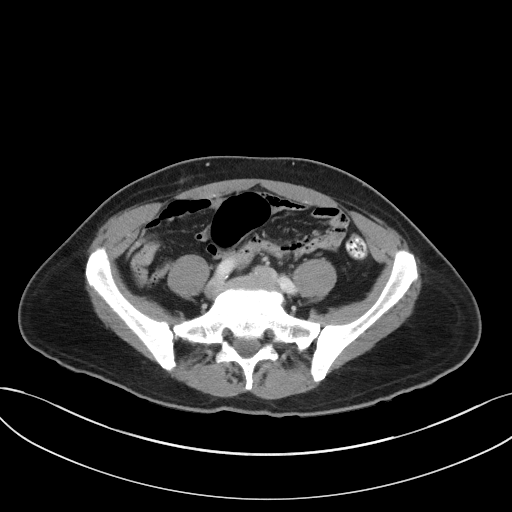
[im 48/111  soft-tissue]
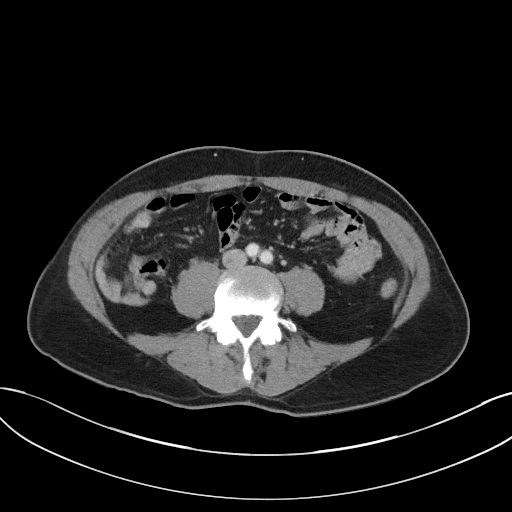
[im 56/111  soft-tissue]
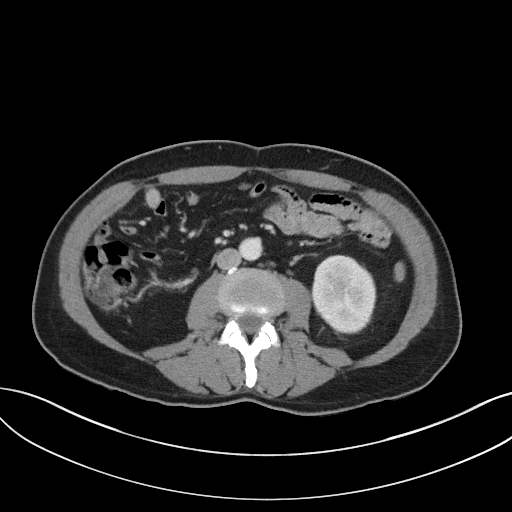
[im 63/111  soft-tissue]
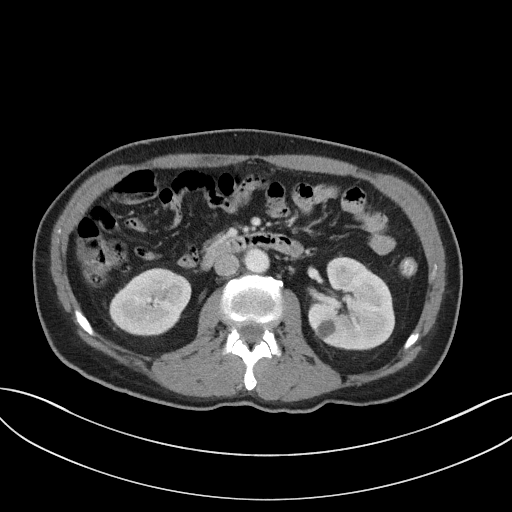
[im 71/111  soft-tissue]
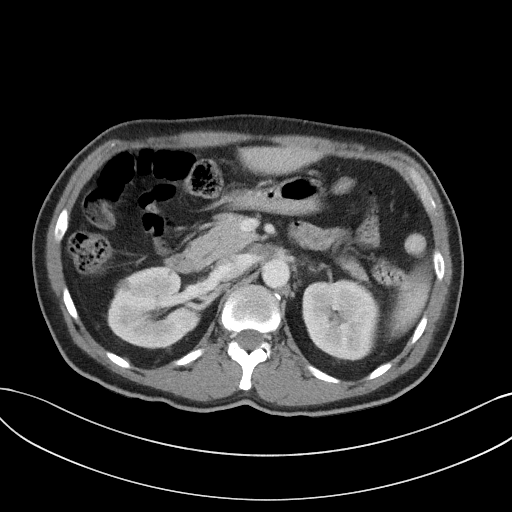
[im 87/111  soft-tissue]
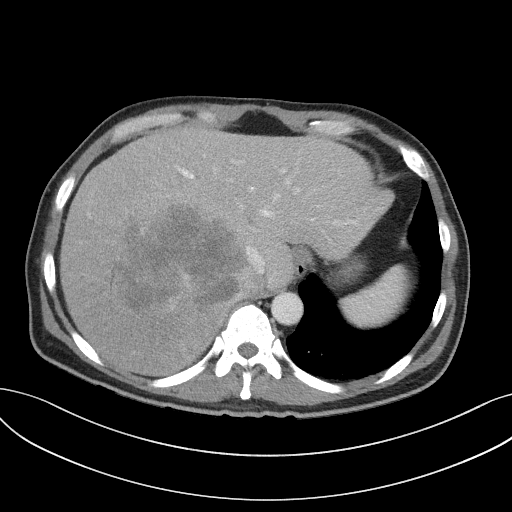
[im 87/111  bone]
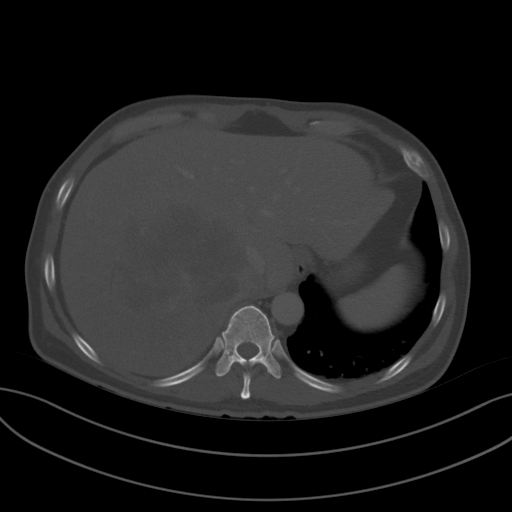
[im 95/111  soft-tissue]
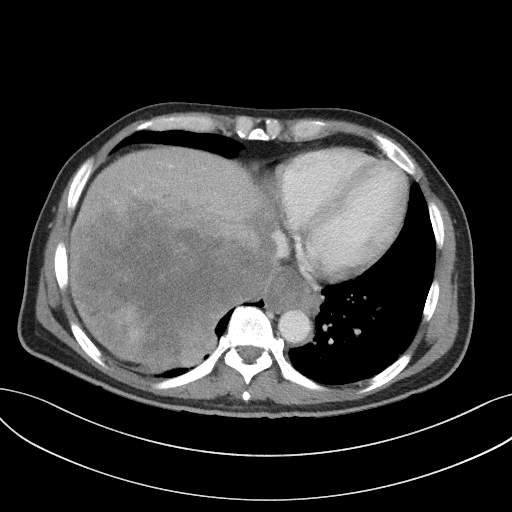
[im 103/111  soft-tissue]
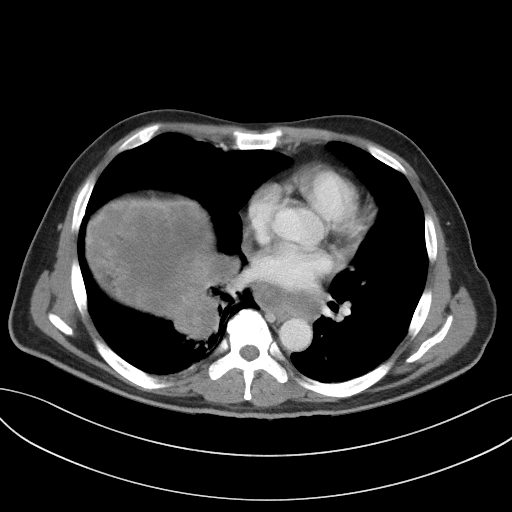

[Series 4: coronal st · coronal · 0.84mm/px · 3 of 80 slices shown]
[im 27/80  soft-tissue]
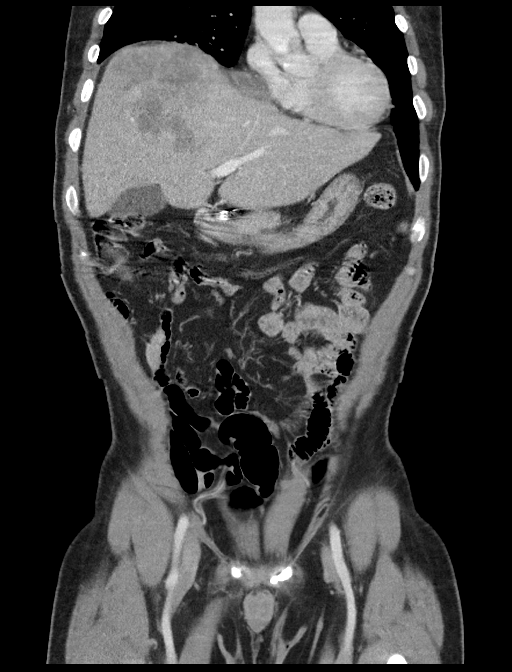
[im 36/80  soft-tissue]
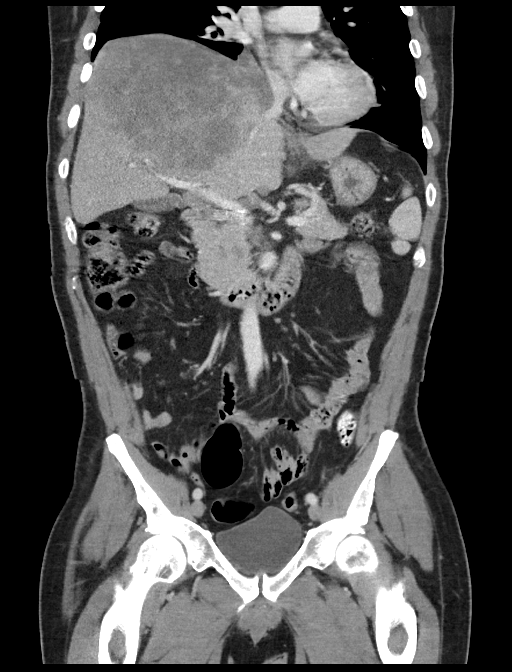
[im 44/80  soft-tissue]
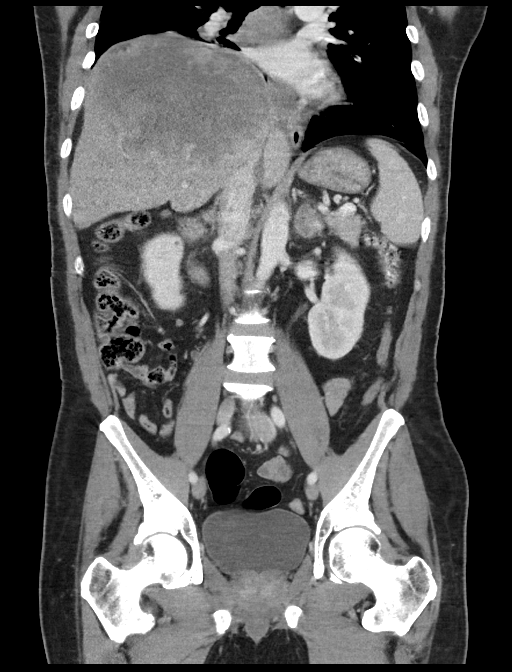

[14 of 46 positions shown; findings below may reference images not displayed]

FINDINGS: Lower chest: Abnormal right infrahilar low-density lymph node 1.4 cm
in short axis on image [DATE]. Extensive subcarinal and paraesophageal
low-density mass significantly increased over the past 5 weeks,
measuring up to 3.3 by 7.0 cm on image [DATE]. A distal paraesophageal
node measures 2.8 cm in short axis on image [DATE]. Possible invasion
of the large right hepatocellular carcinoma through the right
hemidiaphragm on images [DATE].

Hepatobiliary: The mass arising from the right hepatic lobe measures
17.0 by 13.8 by 12.6 cm (volume = 7119 cm^3), a significant
enlargement compared to prior. This appears to extend across into
the left hepatic lobe adjacent to the IVC and displaces and flattens
the intrahepatic IVC significantly.

Pancreas: There is a new 8 mm focus of hypoenhancement in the
pancreatic head near the junction with the uncinate process on image
42/2, not readily visible on the prior exam. Early metastatic lesion
not excluded.

Spleen: Unremarkable

Adrenals/Urinary Tract: New metastatic lesions to the left adrenal
gland, largest 1.8 by 2.9 cm.

New metastatic lesions to the right adrenal gland, including a
by 1.8 cm posterior nodule and a 2.5 by 1.2 cm lateral limb nodule.

There is a new a hypodense nodular lesion in the right mid kidney
measuring 9 mm in diameter on image 42/2, although this resembles a
cyst the fact that is new compared to an exam from 5 weeks ago
raises concern for possible metastatic lesion.

There is also a new small right perirenal nodule measuring about 10
mm in diameter on image 44/2. New anterior right perirenal nodule
1.0 by 0.8 cm on image 36/2.

Stomach/Bowel: Unremarkable

Vascular/Lymphatic: Coils near the origin of the gastroduodenal
artery, no visualized patency of the GDA observed.

Porta hepatis and peripancreatic adenopathy an index node adjacent
to the upper IVC measures 1.7 cm in short axis on image 38/2.

Reproductive: Unremarkable

Other: New omental nodule 1.2 by 0.8 cm, image 57/2. New anterior
abdominal wall subcutaneous nodule, 1.6 by 1.2 cm on image 70/2. New
left anterior omental nodule 0.6 cm in diameter on image 51/2.

New left anterior abdominal wall subcutaneous nodule, 1.7 by 1.1 cm
on image 85/2.

New anterior subcutaneous nodule in the lower pelvis eccentric to
the right, 1.2 by 0.9 cm on image 91/2.

Musculoskeletal: Sacralized L5. Mild left foraminal stenosis at L4-5
due to spurring.
IMPRESSION: 1. Rapidly progressive metastatic disease and enlarging right
hepatic lobe mass. The mass now measures 7119 cubic cm with new
metastatic lesions to the right infrahilar region, subcarinal and
para sat esophageal regions, right perirenal space, anterior
subcutaneous tissues, omentum, porta hepatis, pancreatic head, and
right kidney. The tumor may be invading through the right
hemidiaphragm.
2. Coils near the origin of the gastroduodenal artery without
patency of the GDA observed.
